# Patient Record
Sex: Female | Born: 1990 | Race: Black or African American | Hispanic: No | Marital: Single | Smoking: Former smoker
Health system: Southern US, Community
[De-identification: ages and names within clinical notes are randomized; demographics above are authoritative.]

## PROBLEM LIST (undated history)

## (undated) ENCOUNTER — Inpatient Hospital Stay (HOSPITAL_COMMUNITY): Payer: Self-pay

## (undated) DIAGNOSIS — Z62819 Personal history of unspecified abuse in childhood: Secondary | ICD-10-CM

## (undated) DIAGNOSIS — O24419 Gestational diabetes mellitus in pregnancy, unspecified control: Secondary | ICD-10-CM

## (undated) DIAGNOSIS — F32A Depression, unspecified: Secondary | ICD-10-CM

## (undated) DIAGNOSIS — O139 Gestational [pregnancy-induced] hypertension without significant proteinuria, unspecified trimester: Secondary | ICD-10-CM

## (undated) DIAGNOSIS — J45909 Unspecified asthma, uncomplicated: Secondary | ICD-10-CM

## (undated) DIAGNOSIS — R768 Other specified abnormal immunological findings in serum: Secondary | ICD-10-CM

## (undated) DIAGNOSIS — N12 Tubulo-interstitial nephritis, not specified as acute or chronic: Secondary | ICD-10-CM

## (undated) DIAGNOSIS — F319 Bipolar disorder, unspecified: Secondary | ICD-10-CM

## (undated) DIAGNOSIS — R112 Nausea with vomiting, unspecified: Secondary | ICD-10-CM

## (undated) DIAGNOSIS — F329 Major depressive disorder, single episode, unspecified: Secondary | ICD-10-CM

## (undated) DIAGNOSIS — A749 Chlamydial infection, unspecified: Secondary | ICD-10-CM

## (undated) DIAGNOSIS — F99 Mental disorder, not otherwise specified: Secondary | ICD-10-CM

## (undated) DIAGNOSIS — O009 Unspecified ectopic pregnancy without intrauterine pregnancy: Secondary | ICD-10-CM

## (undated) DIAGNOSIS — R011 Cardiac murmur, unspecified: Secondary | ICD-10-CM

## (undated) DIAGNOSIS — Z9889 Other specified postprocedural states: Secondary | ICD-10-CM

## (undated) DIAGNOSIS — D649 Anemia, unspecified: Secondary | ICD-10-CM

## (undated) HISTORY — PX: WISDOM TOOTH EXTRACTION: SHX21

## (undated) HISTORY — DX: Gestational diabetes mellitus in pregnancy, unspecified control: O24.419

## (undated) HISTORY — DX: Anemia, unspecified: D64.9

## (undated) HISTORY — DX: Mental disorder, not otherwise specified: F99

## (undated) HISTORY — DX: Tubulo-interstitial nephritis, not specified as acute or chronic: N12

## (undated) HISTORY — PX: SKIN GRAFT: SHX250

## (undated) HISTORY — PX: SMALL INTESTINE SURGERY: SHX150

## (undated) HISTORY — PX: LIVER BIOPSY: SHX301

## (undated) HISTORY — DX: Gestational (pregnancy-induced) hypertension without significant proteinuria, unspecified trimester: O13.9

## (undated) HISTORY — PX: DILATION AND CURETTAGE OF UTERUS: SHX78

## (undated) SURGERY — Surgical Case
Anesthesia: *Unknown

---

## 2009-06-22 ENCOUNTER — Ambulatory Visit (HOSPITAL_COMMUNITY): Admission: RE | Admit: 2009-06-22 | Discharge: 2009-06-22 | Payer: Self-pay | Admitting: Obstetrics and Gynecology

## 2009-07-02 ENCOUNTER — Inpatient Hospital Stay (HOSPITAL_COMMUNITY): Admission: AD | Admit: 2009-07-02 | Discharge: 2009-07-02 | Payer: Self-pay | Admitting: Obstetrics and Gynecology

## 2009-07-12 ENCOUNTER — Ambulatory Visit: Payer: Self-pay | Admitting: Gastroenterology

## 2009-07-26 ENCOUNTER — Ambulatory Visit: Payer: Self-pay | Admitting: Gastroenterology

## 2009-08-04 ENCOUNTER — Inpatient Hospital Stay (HOSPITAL_COMMUNITY): Admission: AD | Admit: 2009-08-04 | Discharge: 2009-08-04 | Payer: Self-pay | Admitting: Obstetrics and Gynecology

## 2009-08-06 ENCOUNTER — Inpatient Hospital Stay (HOSPITAL_COMMUNITY): Admission: AD | Admit: 2009-08-06 | Discharge: 2009-08-10 | Payer: Self-pay | Admitting: Obstetrics and Gynecology

## 2009-09-08 ENCOUNTER — Emergency Department (HOSPITAL_COMMUNITY): Admission: EM | Admit: 2009-09-08 | Discharge: 2009-09-08 | Payer: Self-pay | Admitting: Emergency Medicine

## 2009-09-28 ENCOUNTER — Emergency Department (HOSPITAL_COMMUNITY): Admission: EM | Admit: 2009-09-28 | Discharge: 2009-09-28 | Payer: Self-pay | Admitting: Emergency Medicine

## 2010-03-13 ENCOUNTER — Emergency Department (HOSPITAL_COMMUNITY): Admission: EM | Admit: 2010-03-13 | Discharge: 2010-03-13 | Payer: Self-pay | Admitting: Emergency Medicine

## 2010-04-20 ENCOUNTER — Ambulatory Visit: Payer: Self-pay | Admitting: Nurse Practitioner

## 2010-04-20 ENCOUNTER — Inpatient Hospital Stay (HOSPITAL_COMMUNITY): Admission: AD | Admit: 2010-04-20 | Discharge: 2010-04-21 | Payer: Self-pay | Admitting: Obstetrics & Gynecology

## 2010-08-04 NOTE — L&D Delivery Note (Signed)
Delivery Note At 4:25 PM a viable and healthy female was delivered via Vaginal, Spontaneous Delivery (Presentation: Left Occiput Anterior).  APGAR: 8, 9; weight 6 lb 13.7 oz (3110 g).   Placenta status: Intact, Spontaneous.  Cord: 3 vessels with the following complications: None.    Anesthesia: Epidural  Episiotomy: None Lacerations: None Suture Repair: N/A Est. Blood Loss (mL): 250  Mom to postpartum.  Baby to nursery-stable.  Mat Carne 05/20/2011, 5:37 PM

## 2010-10-12 ENCOUNTER — Inpatient Hospital Stay (HOSPITAL_COMMUNITY)
Admission: AD | Admit: 2010-10-12 | Discharge: 2010-10-12 | Disposition: A | Discharge status: Home / Self Care | Payer: Medicaid Other | Source: Ambulatory Visit | Attending: Obstetrics and Gynecology | Admitting: Obstetrics and Gynecology

## 2010-10-12 ENCOUNTER — Inpatient Hospital Stay (HOSPITAL_COMMUNITY): Payer: Medicaid Other

## 2010-10-12 DIAGNOSIS — O210 Mild hyperemesis gravidarum: Secondary | ICD-10-CM | POA: Insufficient documentation

## 2010-10-12 LAB — COMPREHENSIVE METABOLIC PANEL
AST: 19 U/L (ref 0–37)
Albumin: 3.3 g/dL — ABNORMAL LOW (ref 3.5–5.2)
BUN: 3 mg/dL — ABNORMAL LOW (ref 6–23)
Calcium: 8.5 mg/dL (ref 8.4–10.5)
Chloride: 103 mEq/L (ref 96–112)
Creatinine, Ser: 0.63 mg/dL (ref 0.4–1.2)
GFR calc Af Amer: >60 mL/min (ref >60)
GFR calc non Af Amer: >60 mL/min (ref >60)
Potassium: 3.4 mEq/L — ABNORMAL LOW (ref 3.5–5.1)
Total Protein: 6.5 g/dL (ref 6.0–8.3)

## 2010-10-12 LAB — WET PREP, GENITAL
Trich, Wet Prep: NONE SEEN
Yeast Wet Prep HPF POC: NONE SEEN

## 2010-10-12 LAB — URINALYSIS, ROUTINE W REFLEX MICROSCOPIC
Bilirubin Urine: NEGATIVE
Glucose, UA: NEGATIVE mg/dL
Specific Gravity, Urine: 1.01 (ref 1.005–1.030)
pH: 7 (ref 5.0–8.0)

## 2010-10-12 LAB — CBC
MCH: 29.5 pg (ref 26.0–34.0)
MCHC: 33.6 g/dL (ref 30.0–36.0)
MCV: 87.8 fL (ref 78.0–100.0)
RBC: 4.03 MIL/uL (ref 3.87–5.11)

## 2010-10-12 LAB — DIFFERENTIAL
Eosinophils Absolute: 0 10*3/uL (ref 0.0–0.7)
Eosinophils Relative: 0 % (ref 0–5)
Lymphs Abs: 1.1 10*3/uL (ref 0.7–4.0)
Monocytes Absolute: 0.8 10*3/uL (ref 0.1–1.0)
Neutrophils Relative %: 71 % (ref 43–77)

## 2010-10-12 LAB — POCT PREGNANCY, URINE: Preg Test, Ur: POSITIVE

## 2010-10-13 LAB — INFLUENZA PANEL BY PCR (TYPE A & B): Influenza B By PCR: NEGATIVE

## 2010-10-15 LAB — GC/CHLAMYDIA PROBE AMP, GENITAL: Chlamydia, DNA Probe: NEGATIVE

## 2010-10-16 ENCOUNTER — Telehealth (INDEPENDENT_AMBULATORY_CARE_PROVIDER_SITE_OTHER): Payer: Self-pay | Admitting: *Deleted

## 2010-10-17 LAB — URINALYSIS, ROUTINE W REFLEX MICROSCOPIC
Ketones, ur: NEGATIVE mg/dL
Nitrite: NEGATIVE
Protein, ur: NEGATIVE mg/dL
Urobilinogen, UA: 1 mg/dL (ref 0.0–1.0)

## 2010-10-17 LAB — URINE MICROSCOPIC-ADD ON

## 2010-10-17 LAB — URINE CULTURE
Colony Count: 4000
Culture  Setup Time: 201109181205

## 2010-10-17 LAB — WET PREP, GENITAL: Trich, Wet Prep: NONE SEEN

## 2010-10-17 LAB — POCT PREGNANCY, URINE: Preg Test, Ur: NEGATIVE

## 2010-10-18 LAB — URINALYSIS, ROUTINE W REFLEX MICROSCOPIC
Glucose, UA: NEGATIVE mg/dL
Ketones, ur: >80 mg/dL — AB
Protein, ur: 100 mg/dL — AB
Specific Gravity, Urine: 1.039 — ABNORMAL HIGH (ref 1.005–1.030)
Urobilinogen, UA: 1 mg/dL (ref 0.0–1.0)

## 2010-10-18 LAB — URINE MICROSCOPIC-ADD ON

## 2010-10-18 LAB — DIFFERENTIAL
Basophils Relative: 0 % (ref 0–1)
Monocytes Absolute: 1.3 10*3/uL — ABNORMAL HIGH (ref 0.1–1.0)
Neutrophils Relative %: 68 % (ref 43–77)

## 2010-10-18 LAB — CBC
Hemoglobin: 13.1 g/dL (ref 12.0–15.0)
MCH: 30.1 pg (ref 26.0–34.0)
MCV: 86.7 fL (ref 78.0–100.0)
Platelets: 202 10*3/uL (ref 150–400)

## 2010-10-18 LAB — BASIC METABOLIC PANEL
CO2: 21 mEq/L (ref 19–32)
Chloride: 100 mEq/L (ref 96–112)
Creatinine, Ser: 0.94 mg/dL (ref 0.4–1.2)
GFR calc Af Amer: >60 mL/min (ref >60)
GFR calc non Af Amer: >60 mL/min (ref >60)
Glucose, Bld: 92 mg/dL (ref 70–99)
Sodium: 131 mEq/L — ABNORMAL LOW (ref 135–145)

## 2010-10-20 LAB — CBC
HCT: 17.9 % — ABNORMAL LOW (ref 36.0–46.0)
HCT: 31.5 % — ABNORMAL LOW (ref 36.0–46.0)
MCV: 88.5 fL (ref 78.0–100.0)
MCV: 88.9 fL (ref 78.0–100.0)
Platelets: 135 10*3/uL — ABNORMAL LOW (ref 150–400)
Platelets: 171 10*3/uL (ref 150–400)
RBC: 3.56 MIL/uL — ABNORMAL LOW (ref 3.87–5.11)
RDW: 14.6 % (ref 11.5–15.5)
WBC: 6.1 10*3/uL (ref 4.0–10.5)
WBC: 8.3 10*3/uL (ref 4.0–10.5)

## 2010-10-20 LAB — COMPREHENSIVE METABOLIC PANEL
Alkaline Phosphatase: 189 U/L — ABNORMAL HIGH (ref 39–117)
BUN: 2 mg/dL — ABNORMAL LOW (ref 6–23)
CO2: 20 mEq/L (ref 19–32)
Chloride: 109 mEq/L (ref 96–112)
GFR calc non Af Amer: >60 mL/min (ref >60)
Glucose, Bld: 79 mg/dL (ref 70–99)
Potassium: 4.4 mEq/L (ref 3.5–5.1)
Total Bilirubin: 0.2 mg/dL — ABNORMAL LOW (ref 0.3–1.2)

## 2010-10-20 LAB — CROSSMATCH

## 2010-10-20 LAB — URINALYSIS, ROUTINE W REFLEX MICROSCOPIC
Leukocytes, UA: NEGATIVE
Nitrite: NEGATIVE
Protein, ur: NEGATIVE mg/dL
Urobilinogen, UA: 0.2 mg/dL (ref 0.0–1.0)

## 2010-10-20 LAB — URINE MICROSCOPIC-ADD ON

## 2010-10-22 NOTE — Progress Notes (Signed)
Summary: ONE TIME NPI  Phone Note From Other Clinic   Summary of Call: Pt's medicaid card has our name on it but  is not our Pt.Pt is expecting and was @ [facility] in their emergency dept. A ONE time npi was given. [facility] will inform Pt to get card changed.  Initial call taken by: Candi Leash,  October 16, 2010 4:44 PM

## 2010-10-24 LAB — COMPREHENSIVE METABOLIC PANEL
ALT: 14 U/L (ref 0–35)
Alkaline Phosphatase: 73 U/L (ref 39–117)
CO2: 25 mEq/L (ref 19–32)
GFR calc non Af Amer: >60 mL/min (ref >60)
Glucose, Bld: 78 mg/dL (ref 70–99)
Potassium: 3.9 mEq/L (ref 3.5–5.1)
Sodium: 139 mEq/L (ref 135–145)

## 2010-10-24 LAB — URINE MICROSCOPIC-ADD ON

## 2010-10-24 LAB — URINALYSIS, ROUTINE W REFLEX MICROSCOPIC
Ketones, ur: 40 mg/dL — AB
Protein, ur: 100 mg/dL — AB
Urobilinogen, UA: 1 mg/dL (ref 0.0–1.0)

## 2010-10-24 LAB — POCT PREGNANCY, URINE: Preg Test, Ur: NEGATIVE

## 2010-10-24 LAB — DIFFERENTIAL
Basophils Relative: 0 % (ref 0–1)
Eosinophils Absolute: 0.1 10*3/uL (ref 0.0–0.7)
Eosinophils Relative: 1 % (ref 0–5)
Monocytes Relative: 9 % (ref 3–12)
Neutrophils Relative %: 40 % — ABNORMAL LOW (ref 43–77)

## 2010-10-24 LAB — CBC
Hemoglobin: 13 g/dL (ref 12.0–15.0)
RBC: 4.28 MIL/uL (ref 3.87–5.11)
WBC: 4.1 10*3/uL (ref 4.0–10.5)

## 2010-11-06 LAB — URINALYSIS, ROUTINE W REFLEX MICROSCOPIC
Bilirubin Urine: NEGATIVE
Glucose, UA: NEGATIVE mg/dL
Ketones, ur: NEGATIVE mg/dL
Leukocytes, UA: NEGATIVE
Nitrite: NEGATIVE
Protein, ur: NEGATIVE mg/dL
Specific Gravity, Urine: <1.005 — ABNORMAL LOW (ref 1.005–1.030)
Urobilinogen, UA: 0.2 mg/dL (ref 0.0–1.0)

## 2010-11-06 LAB — URINE MICROSCOPIC-ADD ON

## 2010-11-14 ENCOUNTER — Inpatient Hospital Stay (HOSPITAL_COMMUNITY)
Admission: AD | Admit: 2010-11-14 | Discharge: 2010-11-14 | Disposition: A | Discharge status: Home / Self Care | Payer: Medicaid Other | Source: Ambulatory Visit | Attending: Obstetrics and Gynecology | Admitting: Obstetrics and Gynecology

## 2010-11-14 DIAGNOSIS — N760 Acute vaginitis: Secondary | ICD-10-CM | POA: Insufficient documentation

## 2010-11-14 DIAGNOSIS — B9689 Other specified bacterial agents as the cause of diseases classified elsewhere: Secondary | ICD-10-CM | POA: Insufficient documentation

## 2010-11-14 DIAGNOSIS — R109 Unspecified abdominal pain: Secondary | ICD-10-CM | POA: Insufficient documentation

## 2010-11-14 DIAGNOSIS — A499 Bacterial infection, unspecified: Secondary | ICD-10-CM | POA: Insufficient documentation

## 2010-11-14 DIAGNOSIS — O2390 Unspecified genitourinary tract infection in pregnancy, unspecified trimester: Secondary | ICD-10-CM | POA: Insufficient documentation

## 2010-11-14 LAB — WET PREP, GENITAL

## 2010-11-14 LAB — URINALYSIS, ROUTINE W REFLEX MICROSCOPIC
Bilirubin Urine: NEGATIVE
Glucose, UA: NEGATIVE mg/dL
Leukocytes, UA: NEGATIVE
Nitrite: NEGATIVE

## 2010-11-14 LAB — URINE MICROSCOPIC-ADD ON

## 2010-12-03 LAB — ABO/RH: "RH Type ": POSITIVE

## 2010-12-03 LAB — RUBELLA ANTIBODY, IGM: Rubella: IMMUNE

## 2010-12-03 LAB — SYPHILIS: RPR W/REFLEX TO RPR TITER AND TREPONEMAL ANTIBODIES, TRADITIONAL SCREENING AND DIAGNOSIS ALGORITHM: RPR: NONREACTIVE

## 2010-12-03 LAB — HEPATITIS B SURFACE ANTIGEN: Hepatitis B Surface Ag: POSITIVE

## 2010-12-20 ENCOUNTER — Ambulatory Visit (HOSPITAL_COMMUNITY): Payer: Self-pay

## 2010-12-25 ENCOUNTER — Emergency Department (HOSPITAL_COMMUNITY): Payer: Medicaid Other

## 2010-12-25 ENCOUNTER — Emergency Department (HOSPITAL_COMMUNITY)
Admission: EM | Admit: 2010-12-25 | Discharge: 2010-12-25 | Disposition: A | Discharge status: Home / Self Care | Payer: Medicaid Other | Attending: Emergency Medicine | Admitting: Emergency Medicine

## 2010-12-25 DIAGNOSIS — O9989 Other specified diseases and conditions complicating pregnancy, childbirth and the puerperium: Secondary | ICD-10-CM | POA: Insufficient documentation

## 2010-12-25 DIAGNOSIS — M542 Cervicalgia: Secondary | ICD-10-CM | POA: Insufficient documentation

## 2010-12-25 DIAGNOSIS — F3130 Bipolar disorder, current episode depressed, mild or moderate severity, unspecified: Secondary | ICD-10-CM | POA: Insufficient documentation

## 2010-12-25 DIAGNOSIS — IMO0002 Reserved for concepts with insufficient information to code with codable children: Secondary | ICD-10-CM | POA: Insufficient documentation

## 2010-12-25 DIAGNOSIS — M25529 Pain in unspecified elbow: Secondary | ICD-10-CM | POA: Insufficient documentation

## 2010-12-25 DIAGNOSIS — J45909 Unspecified asthma, uncomplicated: Secondary | ICD-10-CM | POA: Insufficient documentation

## 2010-12-25 DIAGNOSIS — IMO0001 Reserved for inherently not codable concepts without codable children: Secondary | ICD-10-CM | POA: Insufficient documentation

## 2010-12-25 DIAGNOSIS — Z8619 Personal history of other infectious and parasitic diseases: Secondary | ICD-10-CM | POA: Insufficient documentation

## 2010-12-25 LAB — POCT I-STAT, CHEM 8
Glucose, Bld: 84 mg/dL (ref 70–99)
HCT: 32 % — ABNORMAL LOW (ref 36.0–46.0)
Hemoglobin: 10.9 g/dL — ABNORMAL LOW (ref 12.0–15.0)
Potassium: 3.4 mEq/L — ABNORMAL LOW (ref 3.5–5.1)

## 2011-01-02 ENCOUNTER — Ambulatory Visit: Payer: Self-pay | Admitting: Gastroenterology

## 2011-01-03 ENCOUNTER — Inpatient Hospital Stay (HOSPITAL_COMMUNITY)
Admission: AD | Admit: 2011-01-03 | Discharge: 2011-01-04 | Disposition: A | Discharge status: Home / Self Care | Payer: Medicaid Other | Source: Ambulatory Visit | Attending: Obstetrics and Gynecology | Admitting: Obstetrics and Gynecology

## 2011-01-03 DIAGNOSIS — A499 Bacterial infection, unspecified: Secondary | ICD-10-CM | POA: Insufficient documentation

## 2011-01-03 DIAGNOSIS — B9689 Other specified bacterial agents as the cause of diseases classified elsewhere: Secondary | ICD-10-CM | POA: Insufficient documentation

## 2011-01-03 DIAGNOSIS — N760 Acute vaginitis: Secondary | ICD-10-CM | POA: Insufficient documentation

## 2011-01-03 DIAGNOSIS — R109 Unspecified abdominal pain: Secondary | ICD-10-CM | POA: Insufficient documentation

## 2011-01-03 DIAGNOSIS — O2390 Unspecified genitourinary tract infection in pregnancy, unspecified trimester: Secondary | ICD-10-CM | POA: Insufficient documentation

## 2011-01-04 LAB — WET PREP, GENITAL: Trich, Wet Prep: NONE SEEN

## 2011-01-04 LAB — URINALYSIS, ROUTINE W REFLEX MICROSCOPIC
Glucose, UA: NEGATIVE mg/dL
Hgb urine dipstick: NEGATIVE
Protein, ur: NEGATIVE mg/dL
Specific Gravity, Urine: 1.02 (ref 1.005–1.030)
pH: 8 (ref 5.0–8.0)

## 2011-01-04 LAB — AMNISURE RUPTURE OF MEMBRANE (ROM) NOT AT ARMC: Amnisure ROM: NEGATIVE

## 2011-01-06 LAB — GC/CHLAMYDIA PROBE AMP, GENITAL
Chlamydia, DNA Probe: POSITIVE — AB
GC Probe Amp, Genital: NEGATIVE

## 2011-01-07 ENCOUNTER — Telehealth: Payer: Self-pay | Admitting: Internal Medicine

## 2011-01-07 NOTE — Telephone Encounter (Signed)
Why did I get this report? Do I need to address it? Thx

## 2011-01-07 NOTE — Telephone Encounter (Signed)
Patient is not Dr. Loren Racer patient. No further action is required by him. Closing phone note.

## 2011-01-10 ENCOUNTER — Encounter (HOSPITAL_COMMUNITY): Payer: Medicaid Other

## 2011-01-24 ENCOUNTER — Inpatient Hospital Stay (HOSPITAL_COMMUNITY)
Admission: AD | Admit: 2011-01-24 | Discharge: 2011-01-24 | Disposition: A | Discharge status: Home / Self Care | Payer: Medicaid Other | Source: Ambulatory Visit | Attending: Obstetrics and Gynecology | Admitting: Obstetrics and Gynecology

## 2011-01-24 DIAGNOSIS — A499 Bacterial infection, unspecified: Secondary | ICD-10-CM | POA: Insufficient documentation

## 2011-01-24 DIAGNOSIS — O4700 False labor before 37 completed weeks of gestation, unspecified trimester: Secondary | ICD-10-CM | POA: Insufficient documentation

## 2011-01-24 DIAGNOSIS — N760 Acute vaginitis: Secondary | ICD-10-CM | POA: Insufficient documentation

## 2011-01-24 DIAGNOSIS — B9689 Other specified bacterial agents as the cause of diseases classified elsewhere: Secondary | ICD-10-CM | POA: Insufficient documentation

## 2011-01-24 DIAGNOSIS — O2390 Unspecified genitourinary tract infection in pregnancy, unspecified trimester: Secondary | ICD-10-CM | POA: Insufficient documentation

## 2011-01-24 DIAGNOSIS — B181 Chronic viral hepatitis B without delta-agent: Secondary | ICD-10-CM | POA: Insufficient documentation

## 2011-01-24 DIAGNOSIS — O98519 Other viral diseases complicating pregnancy, unspecified trimester: Secondary | ICD-10-CM | POA: Insufficient documentation

## 2011-01-24 DIAGNOSIS — F319 Bipolar disorder, unspecified: Secondary | ICD-10-CM | POA: Insufficient documentation

## 2011-01-24 DIAGNOSIS — O99340 Other mental disorders complicating pregnancy, unspecified trimester: Secondary | ICD-10-CM | POA: Insufficient documentation

## 2011-01-24 LAB — URINALYSIS, ROUTINE W REFLEX MICROSCOPIC
Bilirubin Urine: NEGATIVE
Glucose, UA: NEGATIVE mg/dL
Hgb urine dipstick: NEGATIVE
Specific Gravity, Urine: 1.015 (ref 1.005–1.030)
pH: 8 (ref 5.0–8.0)

## 2011-01-24 LAB — RAPID URINE DRUG SCREEN, HOSP PERFORMED
Benzodiazepines: NOT DETECTED
Cocaine: NOT DETECTED

## 2011-01-24 LAB — WET PREP, GENITAL
Trich, Wet Prep: NONE SEEN
Yeast Wet Prep HPF POC: NONE SEEN

## 2011-01-24 LAB — URINE MICROSCOPIC-ADD ON

## 2011-01-26 LAB — STREP B DNA PROBE: Strep Group B Ag: NEGATIVE

## 2011-01-28 LAB — GC/CHLAMYDIA PROBE AMP, GENITAL: GC Probe Amp, Genital: NEGATIVE

## 2011-02-13 ENCOUNTER — Ambulatory Visit: Payer: Medicaid Other | Admitting: Gastroenterology

## 2011-03-08 ENCOUNTER — Inpatient Hospital Stay (HOSPITAL_COMMUNITY)
Admission: AD | Admit: 2011-03-08 | Discharge: 2011-03-08 | Disposition: A | Discharge status: Home / Self Care | Payer: Medicaid Other | Source: Ambulatory Visit | Attending: Obstetrics and Gynecology | Admitting: Obstetrics and Gynecology

## 2011-03-08 ENCOUNTER — Encounter (HOSPITAL_COMMUNITY): Payer: Self-pay

## 2011-03-08 DIAGNOSIS — R109 Unspecified abdominal pain: Secondary | ICD-10-CM | POA: Insufficient documentation

## 2011-03-08 DIAGNOSIS — O99891 Other specified diseases and conditions complicating pregnancy: Secondary | ICD-10-CM | POA: Insufficient documentation

## 2011-03-08 HISTORY — DX: Other specified abnormal immunological findings in serum: R76.8

## 2011-03-08 LAB — URINALYSIS, ROUTINE W REFLEX MICROSCOPIC
Bilirubin Urine: NEGATIVE
Glucose, UA: NEGATIVE mg/dL
Nitrite: NEGATIVE

## 2011-03-08 LAB — WET PREP, GENITAL: Trich, Wet Prep: NONE SEEN

## 2011-03-08 LAB — FETAL FIBRONECTIN: Fetal Fibronectin: NEGATIVE

## 2011-03-08 MED ORDER — METRONIDAZOLE 500 MG PO TABS: 500.0000 mg | ORAL_TABLET | Freq: Two times a day (BID) | ORAL | Status: AC

## 2011-03-08 NOTE — Progress Notes (Signed)
Left message
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
## 2011-03-08 NOTE — Progress Notes (Signed)
Onset of abdominal cramping and pressure since this morning progressively worse, no vaginal bleeding, clear discharge

## 2011-03-08 NOTE — Progress Notes (Signed)
Notified of pt having pain with baby movment and no ctx detected on monitor. Will come to MAU to assess pt

## 2011-03-08 NOTE — ED Notes (Signed)
No UCs, FHR reassuring. Wet prep shows BV.  FFN negative.  Will discharge home with Rx Flagyl for BV. Strongly encouraged patient to make appointment this week with [facility], if we are to remain her providers of record.  Patient seems to understand, and advises she will do so.  Nigel Bridgeman, CNM, [place]

## 2011-03-11 LAB — CULTURE, BETA STREP (GROUP B ONLY)

## 2011-04-01 DIAGNOSIS — O0930 Supervision of pregnancy with insufficient antenatal care, unspecified trimester: Secondary | ICD-10-CM

## 2011-04-01 DIAGNOSIS — O98419 Viral hepatitis complicating pregnancy, unspecified trimester: Secondary | ICD-10-CM | POA: Insufficient documentation

## 2011-04-01 DIAGNOSIS — B1910 Unspecified viral hepatitis B without hepatic coma: Secondary | ICD-10-CM | POA: Insufficient documentation

## 2011-04-01 DIAGNOSIS — F319 Bipolar disorder, unspecified: Secondary | ICD-10-CM | POA: Insufficient documentation

## 2011-04-01 DIAGNOSIS — F329 Major depressive disorder, single episode, unspecified: Secondary | ICD-10-CM | POA: Insufficient documentation

## 2011-04-01 DIAGNOSIS — F32A Depression, unspecified: Secondary | ICD-10-CM | POA: Insufficient documentation

## 2011-04-04 ENCOUNTER — Inpatient Hospital Stay (HOSPITAL_COMMUNITY)
Admission: AD | Admit: 2011-04-04 | Discharge: 2011-04-04 | Disposition: A | Discharge status: Home / Self Care | Payer: Medicaid Other | Source: Ambulatory Visit | Attending: Obstetrics and Gynecology | Admitting: Obstetrics and Gynecology

## 2011-04-04 ENCOUNTER — Encounter (HOSPITAL_COMMUNITY): Payer: Self-pay | Admitting: *Deleted

## 2011-04-04 DIAGNOSIS — A564 Chlamydial infection of pharynx: Secondary | ICD-10-CM | POA: Insufficient documentation

## 2011-04-04 DIAGNOSIS — IMO0002 Reserved for concepts with insufficient information to code with codable children: Secondary | ICD-10-CM | POA: Insufficient documentation

## 2011-04-04 DIAGNOSIS — O98319 Other infections with a predominantly sexual mode of transmission complicating pregnancy, unspecified trimester: Secondary | ICD-10-CM | POA: Insufficient documentation

## 2011-04-04 DIAGNOSIS — A568 Sexually transmitted chlamydial infection of other sites: Secondary | ICD-10-CM

## 2011-04-04 DIAGNOSIS — Z3480 Encounter for supervision of other normal pregnancy, unspecified trimester: Secondary | ICD-10-CM

## 2011-04-04 DIAGNOSIS — F319 Bipolar disorder, unspecified: Secondary | ICD-10-CM | POA: Insufficient documentation

## 2011-04-04 HISTORY — DX: Chlamydial infection, unspecified: A74.9

## 2011-04-04 HISTORY — DX: Bipolar disorder, unspecified: F31.9

## 2011-04-04 LAB — AMNISURE RUPTURE OF MEMBRANE (ROM) NOT AT ARMC: Amnisure ROM: NEGATIVE

## 2011-04-04 LAB — WET PREP, GENITAL
Trich, Wet Prep: NONE SEEN
Yeast Wet Prep HPF POC: NONE SEEN

## 2011-04-04 MED ORDER — AZITHROMYCIN 1 G PO PACK
1.0000 g | PACK | Freq: Once | ORAL | Status: AC
  Administered 2011-04-04: 1 g via ORAL
  Filled 2011-04-04: qty 1
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
## 2011-04-04 NOTE — Progress Notes (Signed)
?   Clear fluid after urinating noted at noon.  No bleeding.  Small amts since, ctx's started shortly after that.   Was going to [facility], has appt at [facility] 09/05,

## 2011-04-07 LAB — CULTURE, BETA STREP (GROUP B ONLY)

## 2011-04-08 LAB — GC/CHLAMYDIA PROBE AMP, GENITAL: GC Probe Amp, Genital: NEGATIVE

## 2011-04-09 ENCOUNTER — Ambulatory Visit (INDEPENDENT_AMBULATORY_CARE_PROVIDER_SITE_OTHER): Payer: Medicaid Other | Admitting: Family Medicine

## 2011-04-09 ENCOUNTER — Encounter: Payer: Self-pay | Admitting: Family Medicine

## 2011-04-09 DIAGNOSIS — B1910 Unspecified viral hepatitis B without hepatic coma: Secondary | ICD-10-CM

## 2011-04-09 DIAGNOSIS — O09292 Supervision of pregnancy with other poor reproductive or obstetric history, second trimester: Secondary | ICD-10-CM | POA: Insufficient documentation

## 2011-04-09 DIAGNOSIS — O0990 Supervision of high risk pregnancy, unspecified, unspecified trimester: Secondary | ICD-10-CM

## 2011-04-09 DIAGNOSIS — Z8751 Personal history of pre-term labor: Secondary | ICD-10-CM

## 2011-04-09 DIAGNOSIS — Z3480 Encounter for supervision of other normal pregnancy, unspecified trimester: Secondary | ICD-10-CM

## 2011-04-09 DIAGNOSIS — O98519 Other viral diseases complicating pregnancy, unspecified trimester: Secondary | ICD-10-CM

## 2011-04-09 DIAGNOSIS — O99340 Other mental disorders complicating pregnancy, unspecified trimester: Secondary | ICD-10-CM

## 2011-04-09 DIAGNOSIS — F329 Major depressive disorder, single episode, unspecified: Secondary | ICD-10-CM

## 2011-04-09 DIAGNOSIS — O26619 Liver and biliary tract disorders in pregnancy, unspecified trimester: Secondary | ICD-10-CM

## 2011-04-09 LAB — POCT URINALYSIS DIP (DEVICE)
Bilirubin Urine: NEGATIVE
Glucose, UA: NEGATIVE mg/dL
Hgb urine dipstick: NEGATIVE
Leukocytes, UA: NEGATIVE
Nitrite: NEGATIVE

## 2011-04-09 NOTE — Progress Notes (Signed)
Patient seen.  Transferred to our practice from [facility] [place] [facility] due to patient preference.  Has history of preterm labor with previous pregnancy at 35 weeks with SROM.   Having mild intermittent contractions with back pain. No vaginal d/c.  Was treated for Chlamydia in [facility]U.  Not sure if partner treated.  Counseled not to have intercourse until sure that partner was treated.  F/u in [facility] in 2 weeks.  1hr GTC done today.

## 2011-04-09 NOTE — Progress Notes (Signed)
Having pelvic pain and pressure. Did 1 hr gtt today lab draw due at 1050

## 2011-04-10 LAB — GLUCOSE TOLERANCE, 1 HOUR: Glucose, 1 Hour GTT: 96 mg/dL (ref 70–140)

## 2011-04-23 ENCOUNTER — Other Ambulatory Visit: Payer: Self-pay | Admitting: Obstetrics and Gynecology

## 2011-04-23 ENCOUNTER — Ambulatory Visit (INDEPENDENT_AMBULATORY_CARE_PROVIDER_SITE_OTHER): Payer: Medicaid Other | Admitting: Physician Assistant

## 2011-04-23 DIAGNOSIS — F329 Major depressive disorder, single episode, unspecified: Secondary | ICD-10-CM

## 2011-04-23 DIAGNOSIS — F319 Bipolar disorder, unspecified: Secondary | ICD-10-CM

## 2011-04-23 DIAGNOSIS — O09219 Supervision of pregnancy with history of pre-term labor, unspecified trimester: Secondary | ICD-10-CM

## 2011-04-23 DIAGNOSIS — B1910 Unspecified viral hepatitis B without hepatic coma: Secondary | ICD-10-CM

## 2011-04-23 DIAGNOSIS — O99340 Other mental disorders complicating pregnancy, unspecified trimester: Secondary | ICD-10-CM

## 2011-04-23 DIAGNOSIS — O98419 Viral hepatitis complicating pregnancy, unspecified trimester: Secondary | ICD-10-CM

## 2011-04-23 DIAGNOSIS — Z8751 Personal history of pre-term labor: Secondary | ICD-10-CM

## 2011-04-23 DIAGNOSIS — O98519 Other viral diseases complicating pregnancy, unspecified trimester: Secondary | ICD-10-CM

## 2011-04-23 LAB — POCT URINALYSIS DIP (DEVICE)
Glucose, UA: NEGATIVE mg/dL
Nitrite: NEGATIVE
Urobilinogen, UA: 0.2 mg/dL (ref 0.0–1.0)

## 2011-04-23 NOTE — Patient Instructions (Signed)
False Labor (Braxton Hicks Contractions) Pregnancy is commonly associated with contractions of the uterus throughout the pregnancy. Towards the end of pregnancy (32-34 weeks), these contractions [facility] Willa Rough) can develop more often and may become more forceful. This is not true labor because these contractions do not result in opening (dilatation) and thinning of the cervix. They are sometimes difficult to tell apart from true labor because these contractions can be forceful and people have different pain tolerances. You should not feel embarrassed if you go to the hospital with false labor. Sometimes, the only way to tell if you are in true labor is for your caregiver to follow the changes in the cervix. HOW TO TELL THE DIFFERENCE BETWEEN TRUE AND FALSE LABOR  False labor.   The contractions of false labor are usually shorter, irregular and not as hard as those of true labor.   They are often felt in the front of the lower abdomen and in the groin.   They may leave with walking around or changing positions while lying down.   They get weaker and are shorter lasting as time goes on.   These contractions are usually irregular.   They do not usually become progressively stronger, regular and closer together as with true labor.   True labor.   Contractions in true labor last 30 to 70 seconds, become very regular, usually become more intense, and increase in frequency.   They do not go away with walking.   The discomfort is usually felt in the top of the uterus and spreads to the lower abdomen and low back.   True labor can be determined by your caregiver with an exam. This will show that the cervix is dilating and getting thinner.  If there are no prenatal problems or other health problems associated with the pregnancy, it is completely safe to be sent home with false labor and await the onset of true labor. HOME CARE INSTRUCTIONS  Keep up with your usual exercises and instructions.    Take medications as directed.   Keep your regular prenatal appointment.   Eat and drink lightly if you think you are going into labor.   If BH contractions are making you uncomfortable:   Change your activity position from lying down or resting to walking/walking to resting.   Sit and rest in a tub of warm water.   Drink 2 to 3 glasses of water. Dehydration may cause B-H contractions.   Do slow and deep breathing several times an hour.  SEEK IMMEDIATE MEDICAL CARE IF:  Your contractions continue to become stronger, more regular, and closer together.   You have a gushing, burst or leaking of fluid from the vagina.   An oral temperature above 100.5 develops.   You have passage of blood-tinged mucus.   You develop vaginal bleeding.   You develop continuous belly (abdominal) pain.   You have low back pain that you never had before.   You feel the baby's head pushing down causing pelvic pressure.   The baby is not moving as much as it used to.  Document Released: 07/21/2005 Document Re-Released: 01/08/2010 [facility] Patient Information 2011 [place], [place].

## 2011-04-23 NOTE — Progress Notes (Signed)
No complaints +FM. GBS/GC/Chl at next visit

## 2011-04-28 ENCOUNTER — Ambulatory Visit (INDEPENDENT_AMBULATORY_CARE_PROVIDER_SITE_OTHER): Payer: Medicaid Other | Admitting: Obstetrics & Gynecology

## 2011-04-28 DIAGNOSIS — Z331 Pregnant state, incidental: Secondary | ICD-10-CM

## 2011-04-28 LAB — POCT URINALYSIS DIP (DEVICE)
Glucose, UA: NEGATIVE mg/dL
Ketones, ur: NEGATIVE mg/dL
Nitrite: POSITIVE — AB

## 2011-04-28 NOTE — Progress Notes (Signed)
Pt has leuks, nitrites, and protein in urine.  Will treat empirically for UTI with Macrobid.  U Cx to be sent.  GBS, GC/ Chlam sent.  Pt states she was treated after the + Chlam several weeks ago.

## 2011-04-28 NOTE — Progress Notes (Signed)
Pulse 86. No vaginal discharge.

## 2011-04-29 LAB — GC/CHLAMYDIA PROBE AMP, GENITAL: Chlamydia, DNA Probe: NEGATIVE

## 2011-04-30 LAB — CULTURE, OB URINE
Colony Count: NO GROWTH
Organism ID, Bacteria: NO GROWTH

## 2011-05-02 ENCOUNTER — Inpatient Hospital Stay (HOSPITAL_COMMUNITY)
Admission: AD | Admit: 2011-05-02 | Discharge: 2011-05-02 | Disposition: A | Discharge status: Home / Self Care | Payer: Medicaid Other | Source: Ambulatory Visit | Attending: Obstetrics & Gynecology | Admitting: Obstetrics & Gynecology

## 2011-05-02 ENCOUNTER — Encounter (HOSPITAL_COMMUNITY): Payer: Self-pay

## 2011-05-02 DIAGNOSIS — R109 Unspecified abdominal pain: Secondary | ICD-10-CM | POA: Insufficient documentation

## 2011-05-02 DIAGNOSIS — N949 Unspecified condition associated with female genital organs and menstrual cycle: Secondary | ICD-10-CM

## 2011-05-02 DIAGNOSIS — A499 Bacterial infection, unspecified: Secondary | ICD-10-CM | POA: Insufficient documentation

## 2011-05-02 DIAGNOSIS — O368190 Decreased fetal movements, unspecified trimester, not applicable or unspecified: Secondary | ICD-10-CM | POA: Insufficient documentation

## 2011-05-02 DIAGNOSIS — B9689 Other specified bacterial agents as the cause of diseases classified elsewhere: Secondary | ICD-10-CM | POA: Insufficient documentation

## 2011-05-02 DIAGNOSIS — O2390 Unspecified genitourinary tract infection in pregnancy, unspecified trimester: Secondary | ICD-10-CM | POA: Insufficient documentation

## 2011-05-02 DIAGNOSIS — N760 Acute vaginitis: Secondary | ICD-10-CM | POA: Insufficient documentation

## 2011-05-02 LAB — URINALYSIS, ROUTINE W REFLEX MICROSCOPIC
Bilirubin Urine: NEGATIVE
Glucose, UA: NEGATIVE mg/dL
Hgb urine dipstick: NEGATIVE
Protein, ur: NEGATIVE mg/dL
Specific Gravity, Urine: 1.02 (ref 1.005–1.030)
Urobilinogen, UA: 0.2 mg/dL (ref 0.0–1.0)

## 2011-05-02 LAB — WET PREP, GENITAL
Trich, Wet Prep: NONE SEEN
Yeast Wet Prep HPF POC: NONE SEEN

## 2011-05-02 MED ORDER — ACETAMINOPHEN 325 MG PO TABS
650.0000 mg | ORAL_TABLET | Freq: Once | ORAL | Status: AC
  Administered 2011-05-02: 650 mg via ORAL
  Filled 2011-05-02: qty 2

## 2011-05-02 MED ORDER — METRONIDAZOLE 500 MG PO TABS: 500.0000 mg | ORAL_TABLET | Freq: Two times a day (BID) | ORAL | Status: AC

## 2011-05-02 NOTE — Progress Notes (Signed)
Patient states that she has not felt baby move all day. She was dx uti on Wednesday but have not gotten her rx filled. She states that she still have pelvic pressure. She denies any contractions, vaginal bleeding, lof. 1cm dilated.

## 2011-05-02 NOTE — ED Provider Notes (Signed)
Patient reviewed with Dr Elwyn Reach, agree with assessment and plans.

## 2011-05-02 NOTE — ED Provider Notes (Signed)
History     Chief Complaint  Patient presents with  . Decreased Fetal Movement  . Abdominal Pain  . Back Pain   HPI Ms. Rybacki is a [id number] at [redacted]w[redacted]d presenting with decreased fetal movement today.  Patient states last ate at 10am this morning; she loses her appetite during pregnancy.  States drinking lots of water and juice.  Also complaining of intermittent pelvic pressure all day.  Describes some mucous discharge; denies loss of fluid and vaginal bleeding.  Has felt baby move since being in [facility].  Denies contractions.  Followed in [facility] for history of preterm delivery; denies any complications with this pregnancy.  Complains of some dysuria.  However GC/Chlamydia and urine cx from 4 days ago are negative.  Did have episode of sharp, cramp-like pelvic pain when laid back for the speculum exam.  Denies f/c/n/v/d and edema.  OB History    Grav Para Term Preterm Abortions TAB SAB Ect Mult Living   4 1 0 1 2 1 0 1 0 1       Past Medical History  Diagnosis Date  . Hepatitis B antibody positive   . Anemia   . Mental disorder     depression and bipolar  . Bipolar 1 disorder     hx cutting   . Chlamydia   . S/P dilation and curettage     for TAB and ectopic    Past Surgical History  Procedure Date  . Liver biopsy   . No past surgeries     Family History  Problem Relation Age of Onset  . Adopted: Yes    History  Substance Use Topics  . Smoking status: Never Smoker   . Smokeless tobacco: Not on file  . Alcohol Use: No    Allergies:  Allergies  Allergen Reactions  . Vicodin (Hydrocodone-Acetaminophen) Anaphylaxis    Prescriptions prior to admission  Medication Sig Dispense Refill  . albuterol (PROVENTIL HFA;VENTOLIN HFA) 108 (90 BASE) MCG/ACT inhaler Inhale 2 puffs into the lungs as needed. For asthma       . calcium carbonate (TUMS - DOSED IN MG ELEMENTAL CALCIUM) 500 MG chewable tablet Chew 1-2 tablets by mouth as needed. For heart burn      . prenatal vitamin w/FE, FA  (PRENATAL 1 + 1) 27-1 MG TABS Take 1 tablet by mouth daily.          ROS Physical Exam   Blood pressure 129/65, pulse 86, temperature 98.9 F (37.2 C), temperature source Oral, height 5\' 3"  (1.6 m), weight 194 lb 6 oz (88.168 kg), last menstrual period 08/18/2010.  Physical Exam  Constitutional: She is oriented to person, place, and time. She appears well-developed and well-nourished. No distress.  HENT:  Head: Normocephalic and atraumatic.  Mouth/Throat: Oropharynx is clear and moist.       Lips are dry and cracked   Eyes: No scleral icterus.  Neck: Normal range of motion. Neck supple.  Cardiovascular: Normal rate, regular rhythm, normal heart sounds and intact distal pulses.  Exam reveals no gallop.   No murmur heard. Respiratory: Effort normal and breath sounds normal. She has no wheezes. She has no rales.  GI: Soft. Bowel sounds are normal. There is tenderness (mild diffuse tenderness to palpation).       Gravid with size consistent with dates  Genitourinary:       External genitalia normal.  Vagina normal; small amount of white/yellowish discharge present.    Musculoskeletal: She exhibits no edema and no  tenderness.  Neurological: She is alert and oriented to person, place, and time.  Skin: Skin is warm and dry. No rash noted. No erythema.  Psychiatric: She has a normal mood and affect.  Cervix: closed and thick FHT: 125, moderate variability, accels present, decels absent  MAU Course  Procedures Wet Prep: few clue cells; many WBCs  MDM Pt complained of lower abdominal pain consistent with round ligament pain during spec exam; gave tylenol 650mg .  Assessment and Plan  20 year old [phone] at 62.5 presenting for decreased fetal movement, resolved while in [facility]U HepB positive Fetal wellbeing reassuring Round ligament pain - tylenol BV - flagyl 500mg  BID x7 days Discharge  Home with labor precautions  BOOTH, Tally Mattox 05/02/2011, 9:17 PM

## 2011-05-02 NOTE — Progress Notes (Signed)
Pt states, " I haven't felt the baby move all day, and I've had low bad pressure and cramping in my low back for three days, and worse today. I have a clear> orange discharge today."

## 2011-05-07 ENCOUNTER — Ambulatory Visit (INDEPENDENT_AMBULATORY_CARE_PROVIDER_SITE_OTHER): Payer: Medicaid Other | Admitting: Advanced Practice Midwife

## 2011-05-07 DIAGNOSIS — O09219 Supervision of pregnancy with history of pre-term labor, unspecified trimester: Secondary | ICD-10-CM

## 2011-05-07 DIAGNOSIS — O0930 Supervision of pregnancy with insufficient antenatal care, unspecified trimester: Secondary | ICD-10-CM

## 2011-05-07 DIAGNOSIS — Z8751 Personal history of pre-term labor: Secondary | ICD-10-CM

## 2011-05-07 LAB — POCT URINALYSIS DIP (DEVICE)
Glucose, UA: NEGATIVE mg/dL
Hgb urine dipstick: NEGATIVE
Nitrite: NEGATIVE
Urobilinogen, UA: 1 mg/dL (ref 0.0–1.0)

## 2011-05-07 NOTE — Progress Notes (Signed)
  Subjective:    Lauren Moss is a 20 y.o. female being seen today for her obstetrical visit. She is at [redacted]w[redacted]d gestation. Patient reports no bleeding, no contractions and no leaking. Fetal movement: normal.  Menstrual History: OB History    Grav Para Term Preterm Abortions TAB SAB Ect Mult Living   4 1 0 1 2 1 0 1 0 1       Patient's last menstrual period was 08/18/2010.    The following portions of the patient's history were reviewed and updated as appropriate: allergies, current medications, past family history, past medical history, past social history, past surgical history and problem list.  Review of Systems Pertinent items are noted in HPI.   Objective:    BP 109/69  Temp 98.3 F (36.8 C)  Wt 87.181 kg (192 lb 3.2 oz)  LMP 08/18/2010 FHT: 130 BPM  Uterine Size: 37.5 cm  Presentations: cephalic  Pelvic Exam:              Dilation: not assessed       Effacement: Not assessed             Station:  not assessed    Consistency: not assessed            Position: not assessed     Assessment:    Pregnancy 37 and 3/7 weeks   Plan:   Plans for delivery: Vaginal anticipated Beta strep culture: done, results reviewed Counseling: labor precautions discussed Follow up in 1 Week.

## 2011-05-07 NOTE — Progress Notes (Signed)
Addended by: Joseph Berkshire on: 05/07/2011 11:32 AM   Modules accepted: Kipp Brood

## 2011-05-07 NOTE — Progress Notes (Signed)
I supervised patient's care and agree with student's note.

## 2011-05-07 NOTE — Progress Notes (Signed)
No vaginal discharge. Pelvic pressure. Pulse 77. Patient declines flu vaccine.

## 2011-05-14 ENCOUNTER — Inpatient Hospital Stay (HOSPITAL_COMMUNITY)
Admission: AD | Admit: 2011-05-14 | Discharge: 2011-05-14 | Disposition: A | Discharge status: Home / Self Care | Payer: Medicaid Other | Source: Ambulatory Visit | Attending: Family Medicine | Admitting: Family Medicine

## 2011-05-14 ENCOUNTER — Ambulatory Visit (INDEPENDENT_AMBULATORY_CARE_PROVIDER_SITE_OTHER): Payer: Medicaid Other | Admitting: Advanced Practice Midwife

## 2011-05-14 ENCOUNTER — Encounter (HOSPITAL_COMMUNITY): Payer: Self-pay

## 2011-05-14 VITALS — BP 117/73 | HR 85 | Temp 97.4°F | Wt 192.6 lb

## 2011-05-14 DIAGNOSIS — O479 False labor, unspecified: Secondary | ICD-10-CM | POA: Insufficient documentation

## 2011-05-14 DIAGNOSIS — IMO0002 Reserved for concepts with insufficient information to code with codable children: Secondary | ICD-10-CM

## 2011-05-14 DIAGNOSIS — M259 Joint disorder, unspecified: Secondary | ICD-10-CM

## 2011-05-14 DIAGNOSIS — O0930 Supervision of pregnancy with insufficient antenatal care, unspecified trimester: Secondary | ICD-10-CM

## 2011-05-14 NOTE — Progress Notes (Signed)
Report called to Maternity Admissions and pt. Taken to MAU to register.

## 2011-05-14 NOTE — Progress Notes (Signed)
Strong contractions since 1030. ? LOF. Pool, nitrazine and fern neg. Reports decreased FM today. Discussed pt w/ Dr. Adrian Blackwater. Will send to MAU for HST and cervix recheck.  Katrinka Blazing, Ladeja Pelham 05/14/2011 11:02 AM

## 2011-05-14 NOTE — Progress Notes (Signed)
Pain: pelvic, lower back. Reports more pain today than before; 10/10 in pain scale.

## 2011-05-14 NOTE — ED Provider Notes (Signed)
Chart reviewed and agree with management and plan.  

## 2011-05-14 NOTE — ED Provider Notes (Signed)
Diona Fanti [id number] @[redacted]w[redacted]d  Chief Complaint  Patient presents with  . Contractions    SUBJECTIVE  HPI: Seen in clinic today with ?SROM and contraction pain since 1030. PROM ruled out with neg fern and cx noted to be 3/long. Continues to have contraction pain in lower abd and back, now more frequent and intense. No LOF or VB. Good FM. PN course uncomplicated   Past Medical History  Diagnosis Date  . Hepatitis B antibody positive   . Anemia   . Mental disorder     depression and bipolar  . Bipolar 1 disorder     hx cutting   . Chlamydia   . S/P dilation and curettage     for TAB and ectopic   Ob Hx:  35 wk SVD  Past Surgical History  Procedure Date  . Liver biopsy   . No past surgeries    History   Social History  . Marital Status: Single    Spouse Name: N/A    Number of Children: N/A  . Years of Education: N/A   Occupational History  . Not on file.   Social History Main Topics  . Smoking status: Never Smoker   . Smokeless tobacco: Not on file  . Alcohol Use: No  . Drug Use: No  . Sexually Active: Yes   Other Topics Concern  . Not on file   Social History Narrative  . No narrative on file   No current facility-administered medications on file prior to encounter.   Current Outpatient Prescriptions on File Prior to Encounter  Medication Sig Dispense Refill  . calcium carbonate (TUMS - DOSED IN MG ELEMENTAL CALCIUM) 500 MG chewable tablet Chew 1-2 tablets by mouth as needed. For heart burn      . prenatal vitamin w/FE, FA (PRENATAL 1 + 1) 27-1 MG TABS Take 1 tablet by mouth daily.        Marland Kitchen albuterol (PROVENTIL HFA;VENTOLIN HFA) 108 (90 BASE) MCG/ACT inhaler Inhale 2 puffs into the lungs as needed. For asthma        Allergies  Allergen Reactions  . Vicodin (Hydrocodone-Acetaminophen) Anaphylaxis    ROS: Pertinent items in HPI  OBJECTIVE  BP 121/69  Pulse 82  Temp(Src) 98.5 F (36.9 C) (Oral)  Resp 20  SpO2 97%  LMP 08/18/2010     Physical Exam:  General: WN/WD , anxious and somewhat uncomfortable appearing Abd: soft, UCs palpate mild, term size Cx: ext 3-4/ int 1/ 2.5 cm long, -3 vtx Back:neg CVAT  UCs irreg2-8 min FHR: 140, reactive Ext: tr edema, 1+ DTRs   ASSESSMENT  [id number] at 38.3 wks, not in labor Fetus well by [facility]   PLAN Home with labor precautions. F/U [facility] 1 wk

## 2011-05-14 NOTE — Progress Notes (Signed)
Patient is sent from the clinic due to pains with contractions.

## 2011-05-18 ENCOUNTER — Inpatient Hospital Stay (HOSPITAL_COMMUNITY)
Admission: AD | Admit: 2011-05-18 | Discharge: 2011-05-18 | Disposition: A | Discharge status: Home / Self Care | Payer: Medicaid Other | Source: Ambulatory Visit | Attending: Obstetrics and Gynecology | Admitting: Obstetrics and Gynecology

## 2011-05-18 DIAGNOSIS — O479 False labor, unspecified: Secondary | ICD-10-CM

## 2011-05-18 DIAGNOSIS — O0930 Supervision of pregnancy with insufficient antenatal care, unspecified trimester: Secondary | ICD-10-CM

## 2011-05-18 NOTE — Progress Notes (Signed)
G4P1 SABx2 at 39.3wks. Felt pop and gush fld at 0200 and has leaked since then. Ctxs started after leaking started.

## 2011-05-18 NOTE — ED Provider Notes (Signed)
History     Chief Complaint  Patient presents with  . Rupture of Membranes  . Contractions   HPI This is a 20 year old G4 P0 121 at 39 weeks who presents the MAU with concerns of ruptured membranes. She states that this happened deliver before 2:00 a morning. She just use the bathroom and went to the refrigerator and felt some fluid leaking down her leg. She denies regular or frequent contractions, decreased fetal activity, abdominal pain, vaginal discharge, vaginal bleeding. She does have some irregular infrequent contractions.   OB History    Grav Para Term Preterm Abortions TAB SAB Ect Mult Living   4 1 0 1 2 1 0 1 0 1       Past Medical History  Diagnosis Date  . Hepatitis B antibody positive   . Anemia   . Mental disorder     depression and bipolar  . Bipolar 1 disorder     hx cutting   . Chlamydia   . S/P dilation and curettage     for TAB and ectopic    Past Surgical History  Procedure Date  . Liver biopsy   . No past surgeries     Family History  Problem Relation Age of Onset  . Adopted: Yes    History  Substance Use Topics  . Smoking status: Never Smoker   . Smokeless tobacco: Not on file  . Alcohol Use: No    Allergies:  Allergies  Allergen Reactions  . Vicodin (Hydrocodone-Acetaminophen) Anaphylaxis    Prescriptions prior to admission  Medication Sig Dispense Refill  . calcium carbonate (TUMS - DOSED IN MG ELEMENTAL CALCIUM) 500 MG chewable tablet Chew 1-2 tablets by mouth as needed. For heart burn      . prenatal vitamin w/FE, FA (PRENATAL 1 + 1) 27-1 MG TABS Take 1 tablet by mouth daily.          Review of Systems  Constitutional: Negative for fever, chills and weight loss.  Eyes: Negative for blurred vision and double vision.  Gastrointestinal: Negative for heartburn and vomiting.  Genitourinary: Negative for urgency, frequency and hematuria.  Neurological: Negative for headaches.   Physical Exam   Blood pressure 131/72, pulse 82,  temperature 98.3 F (36.8 C), resp. rate 20, height 5\' 3"  (1.6 m), weight 90.447 kg (199 lb 6.4 oz), last menstrual period 08/18/2010, SpO2 97.00%.  Physical Exam  Constitutional: She appears well-developed and well-nourished.  Cardiovascular: Normal rate and regular rhythm.   Respiratory: Effort normal. No respiratory distress. She has no wheezes. She has no rales. She exhibits no tenderness.  GI: Soft. Bowel sounds are normal. She exhibits no distension. There is no tenderness. There is no rebound.       Fundus at term.  Genitourinary:       Speculum exam shows thin whitish fluid. There is no pooling. There is no fluid from the cervical os with Valsalva.   Dilation: 1 Effacement (%): 50 Cervical Position: Middle Station: -3 Presentation: Vertex Exam by:: Dr. Adrian Blackwater  NST shows baseline rate at 120 with moderate variability and accelerations present. There are no decelerations. There is readily contractions noted.  Ferning negative  MAU Course  Procedures  Assessment and Plan  #1 intrauterine pregnancy at 39 weeks #2 false labor.  With ferning negative pooling negative and is unlikely that the patient has spontaneous rupture membranes. Will send the patient home to followup at her next scheduled appointment which is within this coming Wednesday. Patient was counseled return  if she begins to feel regular contractions every 3-5 minutes where she has continued leaking of fluid from the vagina.   STINSON, JACOB JEHIEL 05/18/2011, 3:40 AM

## 2011-05-20 ENCOUNTER — Inpatient Hospital Stay (HOSPITAL_COMMUNITY): Payer: Medicaid Other | Admitting: Anesthesiology

## 2011-05-20 ENCOUNTER — Encounter (HOSPITAL_COMMUNITY): Payer: Self-pay | Admitting: Anesthesiology

## 2011-05-20 ENCOUNTER — Inpatient Hospital Stay (HOSPITAL_COMMUNITY)
Admission: AD | Admit: 2011-05-20 | Discharge: 2011-05-22 | DRG: 775 | Disposition: A | Discharge status: Home / Self Care | Payer: Medicaid Other | Source: Ambulatory Visit | Attending: Obstetrics & Gynecology | Admitting: Obstetrics & Gynecology

## 2011-05-20 ENCOUNTER — Encounter (HOSPITAL_COMMUNITY): Payer: Self-pay | Admitting: *Deleted

## 2011-05-20 DIAGNOSIS — B181 Chronic viral hepatitis B without delta-agent: Secondary | ICD-10-CM | POA: Diagnosis present

## 2011-05-20 DIAGNOSIS — O26619 Liver and biliary tract disorders in pregnancy, unspecified trimester: Principal | ICD-10-CM | POA: Diagnosis present

## 2011-05-20 LAB — CBC
MCH: 27.7 pg (ref 26.0–34.0)
MCV: 86.6 fL (ref 78.0–100.0)
MCV: 86.8 fL (ref 78.0–100.0)
Platelets: 173 10*3/uL (ref 150–400)
Platelets: 166 10*3/uL (ref 150–400)
RBC: 3.72 MIL/uL — ABNORMAL LOW (ref 3.87–5.11)
RDW: 14.5 % (ref 11.5–15.5)
RDW: 14.4 % (ref 11.5–15.5)
WBC: 6.3 10*3/uL (ref 4.0–10.5)

## 2011-05-20 LAB — RAPID URINE DRUG SCREEN, HOSP PERFORMED
Amphetamines: NOT DETECTED
Barbiturates: NOT DETECTED
Benzodiazepines: NOT DETECTED
Tetrahydrocannabinol: NOT DETECTED

## 2011-05-20 LAB — RPR: RPR Ser Ql: NONREACTIVE

## 2011-05-20 MED ORDER — BENZOCAINE-MENTHOL 20-0.5 % EX AERO: 1.0000 "application " | INHALATION_SPRAY | CUTANEOUS | Status: DC | PRN

## 2011-05-20 MED ORDER — LANOLIN HYDROUS EX OINT: TOPICAL_OINTMENT | CUTANEOUS | Status: DC | PRN

## 2011-05-20 MED ORDER — ZOLPIDEM TARTRATE 5 MG PO TABS: 5.0000 mg | ORAL_TABLET | Freq: Every evening | ORAL | Status: DC | PRN

## 2011-05-20 MED ORDER — DIPHENHYDRAMINE HCL 25 MG PO CAPS: 25.0000 mg | ORAL_CAPSULE | Freq: Four times a day (QID) | ORAL | Status: DC | PRN

## 2011-05-20 MED ORDER — MISOPROSTOL 200 MCG PO TABS
ORAL_TABLET | ORAL | Status: AC
  Filled 2011-05-20: qty 5

## 2011-05-20 MED ORDER — PRENATAL PLUS 27-1 MG PO TABS
1.0000 | ORAL_TABLET | Freq: Every day | ORAL | Status: DC
  Filled 2011-05-20 (×3): qty 1

## 2011-05-20 MED ORDER — DIBUCAINE 1 % RE OINT: 1.0000 "application " | TOPICAL_OINTMENT | RECTAL | Status: DC | PRN

## 2011-05-20 MED ORDER — SIMETHICONE 80 MG PO CHEW: 80.0000 mg | CHEWABLE_TABLET | ORAL | Status: DC | PRN

## 2011-05-20 MED ORDER — TETANUS-DIPHTH-ACELL PERTUSSIS 5-2.5-18.5 LF-MCG/0.5 IM SUSP: 0.5000 mL | Freq: Once | INTRAMUSCULAR | Status: DC

## 2011-05-20 MED ORDER — LIDOCAINE HCL (PF) 1 % IJ SOLN
30.0000 mL | INTRAMUSCULAR | Status: DC | PRN
  Administered 2011-05-20: 30 mL via SUBCUTANEOUS
  Filled 2011-05-20 (×2): qty 30

## 2011-05-20 MED ORDER — LACTATED RINGERS IV SOLN
500.0000 mL | Freq: Once | INTRAVENOUS | Status: AC
  Administered 2011-05-20: 1000 mL via INTRAVENOUS

## 2011-05-20 MED ORDER — DIPHENHYDRAMINE HCL 50 MG/ML IJ SOLN: 12.5000 mg | INTRAMUSCULAR | Status: DC | PRN

## 2011-05-20 MED ORDER — ONDANSETRON HCL 4 MG/2ML IJ SOLN: 4.0000 mg | INTRAMUSCULAR | Status: DC | PRN

## 2011-05-20 MED ORDER — OXYTOCIN 20 UNITS IN LACTATED RINGERS INFUSION - SIMPLE
125.0000 mL/h | Freq: Once | INTRAVENOUS | Status: AC
  Administered 2011-05-20: 999 mL/h via INTRAVENOUS
  Filled 2011-05-20: qty 1000

## 2011-05-20 MED ORDER — IBUPROFEN 600 MG PO TABS
600.0000 mg | ORAL_TABLET | Freq: Four times a day (QID) | ORAL | Status: DC
  Administered 2011-05-21 – 2011-05-22 (×5): 600 mg via ORAL
  Filled 2011-05-20 (×7): qty 1

## 2011-05-20 MED ORDER — OXYTOCIN BOLUS FROM INFUSION
500.0000 mL | Freq: Once | INTRAVENOUS | Status: AC
  Administered 2011-05-20: 500 mL via INTRAVENOUS
  Filled 2011-05-20: qty 500
  Filled 2011-05-20: qty 1000

## 2011-05-20 MED ORDER — IBUPROFEN 600 MG PO TABS: 600.0000 mg | ORAL_TABLET | Freq: Four times a day (QID) | ORAL | Status: DC | PRN

## 2011-05-20 MED ORDER — EPHEDRINE 5 MG/ML INJ
10.0000 mg | INTRAVENOUS | Status: DC | PRN
  Filled 2011-05-20: qty 4

## 2011-05-20 MED ORDER — ONDANSETRON HCL 4 MG PO TABS: 4.0000 mg | ORAL_TABLET | ORAL | Status: DC | PRN

## 2011-05-20 MED ORDER — EPHEDRINE 5 MG/ML INJ
10.0000 mg | INTRAVENOUS | Status: DC | PRN
  Filled 2011-05-20 (×2): qty 4

## 2011-05-20 MED ORDER — ONDANSETRON HCL 4 MG/2ML IJ SOLN: 4.0000 mg | Freq: Four times a day (QID) | INTRAMUSCULAR | Status: DC | PRN

## 2011-05-20 MED ORDER — SENNOSIDES-DOCUSATE SODIUM 8.6-50 MG PO TABS
2.0000 | ORAL_TABLET | Freq: Every day | ORAL | Status: DC
  Administered 2011-05-20 – 2011-05-21 (×2): 2 via ORAL

## 2011-05-20 MED ORDER — WITCH HAZEL-GLYCERIN EX PADS: 1.0000 "application " | MEDICATED_PAD | CUTANEOUS | Status: DC | PRN

## 2011-05-20 MED ORDER — LACTATED RINGERS IV SOLN: 500.0000 mL | INTRAVENOUS | Status: DC | PRN

## 2011-05-20 MED ORDER — FENTANYL 2.5 MCG/ML BUPIVACAINE 1/10 % EPIDURAL INFUSION (WH - ANES)
14.0000 mL/h | INTRAMUSCULAR | Status: DC
  Administered 2011-05-20 (×3): 14 mL/h via EPIDURAL
  Filled 2011-05-20 (×3): qty 60

## 2011-05-20 MED ORDER — PHENYLEPHRINE 40 MCG/ML (10ML) SYRINGE FOR IV PUSH (FOR BLOOD PRESSURE SUPPORT)
80.0000 ug | PREFILLED_SYRINGE | INTRAVENOUS | Status: DC | PRN
  Filled 2011-05-20 (×2): qty 5

## 2011-05-20 MED ORDER — LACTATED RINGERS IV SOLN
INTRAVENOUS | Status: DC
  Administered 2011-05-20: 125 mL/h via INTRAVENOUS

## 2011-05-20 MED ORDER — OXYTOCIN 10 UNIT/ML IJ SOLN
INTRAMUSCULAR | Status: AC
  Administered 2011-05-20: 20 [IU] via INTRAVENOUS
  Filled 2011-05-20: qty 2

## 2011-05-20 MED ORDER — FLEET ENEMA 7-19 GM/118ML RE ENEM: 1.0000 | ENEMA | RECTAL | Status: DC | PRN

## 2011-05-20 MED ORDER — PHENYLEPHRINE 40 MCG/ML (10ML) SYRINGE FOR IV PUSH (FOR BLOOD PRESSURE SUPPORT)
80.0000 ug | PREFILLED_SYRINGE | INTRAVENOUS | Status: DC | PRN
  Filled 2011-05-20: qty 5

## 2011-05-20 MED ORDER — CITRIC ACID-SODIUM CITRATE 334-500 MG/5ML PO SOLN: 30.0000 mL | ORAL | Status: DC | PRN

## 2011-05-20 MED ORDER — LIDOCAINE HCL 1.5 % IJ SOLN
INTRAMUSCULAR | Status: DC | PRN
  Administered 2011-05-20 (×2): 5 mL via INTRADERMAL

## 2011-05-20 NOTE — Progress Notes (Signed)
Lauren Moss is a 20 y.o. [phone] at [redacted]w[redacted]d  Subjective: Becoming more comfortable with epid, but still with some abd pain with ctx  Objective: BP 109/59  Pulse 80  Resp 18  Ht 5\' 3"  (1.6 m)  Wt 89.359 kg (197 lb)  BMI 34.90 kg/m2  SpO2 97%  LMP 08/18/2010      FHT:  FHR: 130 bpm, variability: moderate,  accelerations:  Present,  decelerations:  Absent UC:   regular, every 3-5 minutes SVE:   Dilation: 4.5 Effacement (%): 80 Station: -2 Exam by:: Valentina Lucks, RN  Labs: Lab Results  Component Value Date   WBC 5.7 05/20/2011   HGB 10.3* 05/20/2011   HCT 32.2* 05/20/2011   MCV 86.6 05/20/2011   PLT 173 05/20/2011    Assessment / Plan: Spontaneous labor, progressing normally  Will reeval cx at approx noon or sooner prn Marthena Whitmyer 05/20/2011, 10:31 AM

## 2011-05-20 NOTE — Progress Notes (Addendum)
Lauren Moss is a 20 y.o. [phone] with Hep B, a history of postpartum hemorrhage previous preganancy at [redacted]w[redacted]d by admitted for active labor  Subjective: Patient fatigued, minimal pain with contractions s/p epidural, no ROM, bloody show present   Objective: BP 117/54  Pulse 59  Temp(Src) 98.4 F (36.9 C) (Oral)  Resp 18  Ht 5\' 3"  (1.6 m)  Wt 197 lb (89.359 kg)  BMI 34.90 kg/m2  SpO2 97%  LMP 08/18/2010      FHT:  FHR: 130 bpm, variability: moderate,  accelerations:  Present,  decelerations:  Absent UC:   regular, every 5 minutes SVE:   Dilation: 7 Effacement (%): 90 Station: 0 Exam by:: Roslynn Amble MD  Labs: Lab Results  Component Value Date   WBC 5.7 05/20/2011   HGB 10.3* 05/20/2011   HCT 32.2* 05/20/2011   MCV 86.6 05/20/2011   PLT 173 05/20/2011    Assessment / Plan: Spontaneous labor, progressing normally, membranes still intact   Labor: Progressing normally Fetal Wellbeing:  Category I Pain Control:  Epidural I/D:  Patient with Hep B, so proper contact precautions Anticipated MOD:  NSVD  Lauren Moss 05/20/2011, 2:02 PM

## 2011-05-20 NOTE — H&P (Addendum)
Lauren Moss is a 20 y.o. female presenting for contractions. Maternal Medical History:  Reason for admission: Reason for admission: contractions.  Reason for Admission:   nauseaContractions: Onset was yesterday.   Frequency: regular.   Duration is approximately 60 seconds.   Perceived severity is moderate.    Fetal activity: Perceived fetal activity is normal.   Last perceived fetal movement was within the past hour.    Prenatal complications: No bleeding, HIV, polyhydramnios or pre-eclampsia.   Patient has chronic Hepatitis B  Prenatal Complications - Diabetes: none.   Has history of PPH after first delivery requiring blood transfusion. OB History    Grav Para Term Preterm Abortions TAB SAB Ect Mult Living   4 1 0 1 2 1 0 1 0 1      Past Medical History  Diagnosis Date  . Hepatitis B antibody positive   . Anemia   . Mental disorder     depression and bipolar  . Bipolar 1 disorder     hx cutting   . Chlamydia   . S/P dilation and curettage     for TAB and ectopic   Past Surgical History  Procedure Date  . Liver biopsy   . No past surgeries   . Small intestine surgery     pt had intusseseption at 20 years old  . Dilation and curettage of uterus    Family History: family history is not on file.  She is adopted. Social History:  reports that she has never smoked. She has never used smokeless tobacco. She reports that she does not drink alcohol or use illicit drugs.  Review of Systems  Constitutional: Negative for fever.  Eyes: Negative for blurred vision.  Respiratory: Negative for cough and shortness of breath.   Cardiovascular: Negative for chest pain.  Gastrointestinal: Negative for heartburn, nausea and vomiting.  Genitourinary: Negative for dysuria.  Musculoskeletal: Negative for myalgias.  Skin: Negative for rash.  Neurological: Negative for dizziness and headaches.    Dilation: 4 Effacement (%): 90 Station: -3;-2 Exam by:: Dr Natale Milch Last  menstrual period 08/18/2010. Maternal Exam:  Uterine Assessment: Contraction strength is moderate.  Contraction duration is 60 seconds. Contraction frequency is regular.   Abdomen: Estimated fetal weight is 7 .5 pounds.   Fetal presentation: vertex  Introitus: Normal vulva. Normal vagina.  Pelvis: adequate for delivery.   Cervix: Cervix evaluated by digital exam.     Fetal Exam Fetal Monitor Review: Mode: ultrasound.   Baseline rate: 130s.  Variability: moderate (6-25 bpm).   Pattern: accelerations present and variable decelerations.    Fetal State Assessment: Category II - tracings are indeterminate.     Physical Exam  Constitutional: She is oriented to person, place, and time. She appears well-developed and well-nourished. She appears distressed.  HENT:  Head: Normocephalic.  Eyes: Pupils are equal, round, and reactive to light.  Neck: Normal range of motion.  Cardiovascular: Normal rate, regular rhythm, normal heart sounds and intact distal pulses.   No murmur heard. Respiratory: Effort normal and breath sounds normal. No respiratory distress. She has no wheezes.  GI: Soft. Bowel sounds are normal. She exhibits no distension.  Musculoskeletal: Normal range of motion.  Neurological: She is alert and oriented to person, place, and time. She has normal reflexes. She displays normal reflexes. No cranial nerve deficit. Coordination normal.  Skin: Skin is warm and dry. No rash noted. She is not diaphoretic.  Psychiatric: She has a normal mood and affect.    Prenatal  labs: ABO, Rh: --/--/A POS (05/23 1941) Antibody:   Rubella: Immune (05/01 0000) RPR: Nonreactive (05/01 0000)  HBsAg: Positive (05/01 0000)  HIV: Non-reactive (05/01 0000)  GBS: NEGATIVE (06/22 1330)   Assessment/Plan: Active labor at 39.2 wks G4P0121, Chronic Hepatitis B, history of PPH after first delivery requiring blood transfusion 1. Admit for L&D 2. GBS Neg 3. Desires Epidural 4. Infant will get Hep  B vaccine and HBIG after birth 5. No FSE 6. Desires to formula feed and plans for Mirena 7. Delivery team to have cytotec available at the time of delivery preemptively due to history of PPH  Lauren Moss N 05/20/2011, 9:14 AM

## 2011-05-20 NOTE — Progress Notes (Signed)
Attempted I & O catheter approximately 1810 notice labial laceration on right side. Moderate amount of bleeding from wound when manipulated. Notified Cam Hai CNM to assess pt at bedside. Will continue to monitor

## 2011-05-20 NOTE — Anesthesia Preprocedure Evaluation (Signed)
Anesthesia Evaluation  Name, MR# and DOB Patient awake  General Assessment Comment  Reviewed: Allergy & Precautions, H&P , Patient's Chart, lab work & pertinent test results  Airway Mallampati: II TM Distance: >3 FB Neck ROM: full    Dental No notable dental hx.    Pulmonary  clear to auscultation  Pulmonary exam normal       Cardiovascular regular Normal    Neuro/Psych PSYCHIATRIC DISORDERS Negative Neurological ROS  Negative Psych ROS   GI/Hepatic negative GI ROS Neg liver ROS  (+) B  Endo/Other  Negative Endocrine ROSMorbid obesity  Renal/GU negative Renal ROS     Musculoskeletal   Abdominal   Peds  Hematology negative hematology ROS (+)   Anesthesia Other Findings   Reproductive/Obstetrics (+) Pregnancy                           Anesthesia Physical Anesthesia Plan  ASA: III  Anesthesia Plan: Epidural   Post-op Pain Management:    Induction:   Airway Management Planned:   Additional Equipment:   Intra-op Plan:   Post-operative Plan:   Informed Consent: I have reviewed the patients History and Physical, chart, labs and discussed the procedure including the risks, benefits and alternatives for the proposed anesthesia with the patient or authorized representative who has indicated his/her understanding and acceptance.     Plan Discussed with:   Anesthesia Plan Comments:         Anesthesia Quick Evaluation

## 2011-05-20 NOTE — Progress Notes (Cosign Needed)
Pt states regular ctx's since 0700, denies bleeding or lof, +FM, q2-5 minutes apart, last pregnancy had "quick" delivery.

## 2011-05-20 NOTE — Anesthesia Procedure Notes (Signed)
Epidural Patient location during procedure: OB Start time: 05/20/2011 9:56 AM  Staffing Performed by: anesthesiologist   Preanesthetic Checklist Completed: patient identified, site marked, surgical consent, pre-op evaluation, timeout performed, IV checked, risks and benefits discussed and monitors and equipment checked  Epidural Patient position: sitting Prep: site prepped and draped and DuraPrep Patient monitoring: continuous pulse ox and blood pressure Approach: midline Injection technique: LOR air and LOR saline  Needle:  Needle type: Tuohy  Needle gauge: 17 G Needle length: 9 cm Needle insertion depth: 5 cm cm Catheter type: closed end flexible Catheter size: 19 Gauge Catheter at skin depth: 10 cm Test dose: negative  Assessment Events: blood not aspirated, injection not painful, no injection resistance, negative IV test and no paresthesia  Additional Notes Patient identified.  Risk benefits discussed including failed block, incomplete pain control, headache, nerve damage, paralysis, blood pressure changes, nausea, vomiting, reactions to medication both toxic or allergic, and postpartum back pain.  Patient expressed understanding and wished to proceed.  All questions were answered.  Sterile technique used throughout procedure and epidural site dressed with sterile barrier dressing. No paresthesia or other complications noted.The patient did not experience any signs of intravascular injection such as tinnitus or metallic taste in mouth nor signs of intrathecal spread such as rapid motor block. Please see nursing notes for vital signs.

## 2011-05-20 NOTE — Progress Notes (Signed)
Lauren Moss is a 20 y.o. [phone] at [redacted]w[redacted]d Subjective: Comfortable with epid; not feeling pressure; pt staring at TV while I was talking about the plan- able to respond and get her attention, but appears not to focus well  Objective: BP 118/66  Pulse 81  Temp(Src) 98.7 F (37.1 C) (Oral)  Resp 20  Ht 5\' 3"  (1.6 m)  Wt 89.359 kg (197 lb)  BMI 34.90 kg/m2  SpO2 97%  LMP 08/18/2010      FHT:  FHR: 130 bpm, variability: moderate,  accelerations:  Present,  decelerations:  Absent; currently appears in sleep cycle, but accels in last 30 mins UC:   regular, every 3-5 minutes SVE:   Dilation: 9 Effacement (%): 100 Station: 0 Exam by:: Lauren Moss CNM SROM with exam- clear Labs: Lab Results  Component Value Date   WBC 5.7 05/20/2011   HGB 10.3* 05/20/2011   HCT 32.2* 05/20/2011   MCV 86.6 05/20/2011   PLT 173 05/20/2011    Assessment / Plan: Spontaneous labor, progressing normally  Will reeval in 1-2 hours or sooner if feeling the urge to push Will get UDS as part of mental status assessment   Lauren Moss 05/20/2011, 3:25 PM

## 2011-05-20 NOTE — Procedures (Signed)
Indication: I was notified of continuous bleeding and pain in the right interior labia of Lauren Moss at approximately 18:15. Upon inspection, there was a 2 cm laceration of the labia resulting from birthing trauma. It was actively bleeding at the time of inspection.  Physician: Si Raider. Clinton Sawyer, M.D. Nurse: Cam Hai C.N.M.  Description: The patient was explained the need for repair with suture and provided a verbal acknowledgement and agreement. Anesthesia was Lidcocaine 10% , 13 mL injected subcutaneously. 3-0 vicryl stitch was used to close the wound.  Complications: None EBL: 300 mL Disposition: The patient's labial laceration was successfully closed and bleeding had ceased. Lauren Moss was stable and appropriate for transfer from the delivery suite to the postpartum floor.

## 2011-05-21 LAB — CBC
HCT: 29.1 % — ABNORMAL LOW (ref 36.0–46.0)
Hemoglobin: 9.4 g/dL — ABNORMAL LOW (ref 12.0–15.0)
MCH: 28 pg (ref 26.0–34.0)
MCHC: 32.3 g/dL (ref 30.0–36.0)
MCV: 86.6 fL (ref 78.0–100.0)
Platelets: 165 10*3/uL (ref 150–400)
RBC: 3.36 MIL/uL — ABNORMAL LOW (ref 3.87–5.11)
RDW: 14.5 % (ref 11.5–15.5)
WBC: 7.6 10*3/uL (ref 4.0–10.5)

## 2011-05-21 MED ORDER — IBUPROFEN 600 MG PO TABS: 600.0000 mg | ORAL_TABLET | Freq: Four times a day (QID) | ORAL | Status: AC

## 2011-05-21 NOTE — Anesthesia Postprocedure Evaluation (Signed)
Anesthesia Post Note  Patient: Lauren Moss  Procedure(s) Performed: * No procedures listed *  Anesthesia type: Epidural  Patient location: Mother/Baby  Post pain: Pain level controlled  Post assessment: Post-op Vital signs reviewed  Last Vitals:  Filed Vitals:   05/21/11 0616  BP: 112/69  Pulse: 75  Temp: 98.4 F (36.9 C)  Resp: 18    Post vital signs: Reviewed  Level of consciousness: awake  Complications: No apparent anesthesia complications

## 2011-05-21 NOTE — Discharge Summary (Addendum)
Obstetric Discharge Summary Reason for Admission: onset of labor Prenatal Procedures: none Intrapartum Procedures: spontaneous vaginal delivery Postpartum Procedures: none Complications-Operative and Postpartum: none Hemoglobin  Date Value Range Status  05/21/2011 9.4* 12.0-15.0 (g/dL) Final     HCT  Date Value Range Status  05/21/2011 29.1* 36.0-46.0 (%) Final    Discharge Diagnoses: Term Pregnancy-delivered  Discharge Information: Date: 05/21/2011 Activity: pelvic rest Diet: routine Medications: Ibuprofen Condition: stable Instructions: refer to practice specific booklet Discharge to: home SW consulted w/ CPS. Plan to F/U w/ pt PP for Hx of Bipolar Disorder, hallucinations. Pt reports stable mood and has established mental healthcare provider. BC: Mirena Bottle  Newborn Data: Live born female  Birth Weight: 6 lb 13.7 oz (3110 g) APGAR: 8, 9  Home with mother.  WALDEN,JEFF 05/21/2011, 10:43 AM  Addendum by  Dorathy Kinsman 05/22/2011 9:58 AM

## 2011-05-21 NOTE — Progress Notes (Signed)
UR chart review completed.  

## 2011-05-21 NOTE — Progress Notes (Addendum)
Post Partum Day 1 Subjective: voiding, tolerating PO and + flatus C/o of nasal stuffiness and dry cough since last night. Bleeding PV decreasing, baby doing well and bottle feeding. No other complaints   Objective: Blood pressure 112/69, pulse 75, temperature 98.4 F (36.9 C), temperature source Oral, resp. rate 18, height 5\' 3"  (1.6 m), weight 89.359 kg (197 lb), last menstrual period 08/18/2010, SpO2 97.00%, unknown if currently breastfeeding.   Physical Exam:  General: alert, cooperative, appears stated age and no distress Uterine Fundus: firm Incision: healing well, no significant drainage, no significant erythema DVT Evaluation: No evidence of DVT seen on physical exam. Labial laceration:  Healing well, no edema, no drainage, no bleeding.  Lac site suture, clean, dry, intact.  CBC    Component Value Date/Time   WBC 7.6 05/21/2011 0557   RBC 3.36* 05/21/2011 0557   HGB 9.4* 05/21/2011 0557   HCT 29.1* 05/21/2011 0557   PLT 165 05/21/2011 0557   MCV 86.6 05/21/2011 0557   MCH 28.0 05/21/2011 0557   MCHC 32.3 05/21/2011 0557   RDW 14.5 05/21/2011 0557   LYMPHSABS 1.1 10/12/2010 1733   MONOABS 0.8 10/12/2010 1733   EOSABS 0.0 10/12/2010 1733   BASOSABS 0.0 10/12/2010 1733     Basename 05/21/11 0557 05/20/11 1716  HGB 9.4* 9.3*  HCT 29.1* 28.9*    Assessment/Plan: Discharge home    LOS: 1 day   Chetan Kapat 05/21/2011, 8:46 AM    Examined patient.  Discussed with PA student.  Agree with above.   Renold Don MD 05/21/2011  1040 AM

## 2011-05-22 NOTE — Discharge Summary (Signed)
Attestation of Attending Supervision of Resident: Evaluation and management procedures were performed by the [facility] Resident under my supervision.  I have reviewed the resident's note, chart reviewed and agree with management and plan.  Amarion Portell A 05/22/2011 2:52 PM

## 2011-05-22 NOTE — Progress Notes (Signed)
Post Partum Day 2 Subjective: no complaints, voiding, tolerating PO and + flatus  and has had BM. Her nasal stuffiness has improved, no fever, bleeding PV has decreased, chosen Mirena for her her method of contraception and states her mood is stable. She states she has supported well and is looking forward to discharge. Baby is doing well and bottle fed  Objective: Blood pressure 117/63, pulse 75, temperature 98.4 F (36.9 C), temperature source Oral, resp. rate 18, height 5\' 3"  (1.6 m), weight 89.359 kg (197 lb), last menstrual period 08/18/2010, SpO2 97.00%, bottle feeding baby  Physical Exam:  General: alert, cooperative, appears stated age and no distress Uterine Fundus: firm DVT Evaluation: No evidence of DVT seen on physical exam.   Basename 05/21/11 0557 05/20/11 1716  HGB 9.4* 9.3*  HCT 29.1* 28.9*    Assessment/Plan: Discharge home and schedule for return visit for past partum care at about 5 weeks from today   LOS: 2 days   Chetan Kapat 05/22/2011, 7:59 AM

## 2011-05-22 NOTE — Progress Notes (Addendum)
CPS worker, Lauren Moss came to meet with the MOB and assess social situation, as pt has a history with CPS.  Charles told this Sw that infant can discharge home with the MOB and CPS will follow up with the family at home.  Sw will follow up with drug screen results and make referral if needed. UDS is negative, meconium is pending.

## 2011-05-23 NOTE — Progress Notes (Signed)
Pt has established care w/ [facility] for mental healthcare for Bipolar Disorder. [facility] Social Worker discussed pts Hx w/ CPS. CPS will F/U w/ pt after discharge.  Katrinka Blazing, Cortana Vanderford 05/23/2011 1:22 PM

## 2011-06-25 ENCOUNTER — Ambulatory Visit: Payer: Medicaid Other | Admitting: Physician Assistant

## 2011-09-28 ENCOUNTER — Encounter (HOSPITAL_COMMUNITY): Payer: Self-pay | Admitting: *Deleted

## 2011-09-28 ENCOUNTER — Emergency Department (HOSPITAL_COMMUNITY)
Admission: EM | Admit: 2011-09-28 | Discharge: 2011-09-29 | Disposition: A | Discharge status: Home / Self Care | Payer: Medicaid Other | Attending: Emergency Medicine | Admitting: Emergency Medicine

## 2011-09-28 DIAGNOSIS — O200 Threatened abortion: Secondary | ICD-10-CM | POA: Insufficient documentation

## 2011-09-28 DIAGNOSIS — O4690 Antepartum hemorrhage, unspecified, unspecified trimester: Secondary | ICD-10-CM | POA: Insufficient documentation

## 2011-09-28 NOTE — ED Notes (Addendum)
Pt in c/o vaginal bleeding that starting 4 days ago, states she tested positive on a home pregnancy test, states this would be her time for a period, states she tested positive in January, c/o generalized lower abd pain at this time

## 2011-09-29 ENCOUNTER — Emergency Department (HOSPITAL_COMMUNITY): Payer: Medicaid Other

## 2011-09-29 LAB — PREGNANCY, URINE: Preg Test, Ur: POSITIVE — AB

## 2011-09-29 LAB — URINALYSIS, ROUTINE W REFLEX MICROSCOPIC
Hgb urine dipstick: NEGATIVE
Leukocytes, UA: NEGATIVE
Nitrite: NEGATIVE
Protein, ur: NEGATIVE mg/dL
Specific Gravity, Urine: 1.026 (ref 1.005–1.030)
Urobilinogen, UA: 1 mg/dL (ref 0.0–1.0)

## 2011-09-29 LAB — ABO/RH: ABO/RH(D): A POS

## 2011-09-29 LAB — WET PREP, GENITAL

## 2011-09-29 NOTE — Discharge Instructions (Signed)
Threatened Miscarriage  Bleeding during the first 20 weeks of pregnancy is common. This is sometimes called a threatened miscarriage. This is a pregnancy that is threatening to end before the twentieth week of pregnancy. Often this bleeding stops with bed rest or decreased activities as suggested by your caregiver and the pregnancy continues without any more problems. You may be asked to not have sexual intercourse, have orgasms or use tampons until further notice. Sometimes a threatened miscarriage can progress to a complete or incomplete miscarriage. This may or may not require further treatment. Some miscarriages occur before a woman misses a menstrual period and knows she is pregnant.  Miscarriages occur in 15 to 20% of all pregnancies and usually occur during the first 13 weeks of the pregnancy. The exact cause of a miscarriage is usually never known. A miscarriage is natures way of ending a pregnancy that is abnormal or would not make it to term. There are some things that may put you at risk to have a miscarriage, such as:   Hormone problems.   Infection of the uterus or cervix.   Chronic illness, diabetes for example, especially if it is not controlled.   Abnormal shaped uterus.   Fibroids in the uterus.   Incompetent cervix (the cervix is too weak to hold the baby).   Smoking.   Drinking too much alcohol. It's best not to drink any alcohol when you are pregnant.   Taking illegal drugs.  TREATMENT   When a miscarriage becomes complete and all products of conception (all the tissue in the uterus) have been passed, often no treatment is needed. If you think you passed tissue, save it in a container and take it to your doctor for evaluation. If the miscarriage is incomplete (parts of the fetus or placenta remain in the uterus), further treatment may be needed. The most common reason for further treatment is continued bleeding (hemorrhage) because pregnancy tissue did not pass out of the uterus. This  often occurs if a miscarriage is incomplete. Tissue left behind may also become infected. Treatment usually is dilatation and curettage (the removal of the remaining products of pregnancy. This can be done by a simple sucking procedure (suction curettage) or a simple scraping of the inside of the uterus. This may be done in the hospital or in the caregiver's office. This is only done when your caregiver knows that there is no chance for the pregnancy to proceed to term. This is determined by physical examination, negative pregnancy test, falling pregnancy hormone count and/or, an ultrasound revealing a dead fetus.  Miscarriages are often a very emotional time for prospective mothers and fathers. This is not you or your partners fault. It did not occur because of an inadequacy in you or your partner. Nearly all miscarriages occur because the pregnancy has started off wrongly. At least half of these pregnancies have a chromosomal abnormality. It is almost always not inherited. Others may have developmental problems with the fetus or placenta. This does not always show up even when the products miscarried are studied under the microscope. The miscarriage is nearly always not your fault and it is not likely that you could have prevented it from happening. If you are having emotional and grieving problems, talk to your health care provider and even seek counseling, if necessary, before getting pregnant again. You can begin trying for another pregnancy as soon as your caregiver says it is OK.  HOME CARE INSTRUCTIONS    Your caregiver may order   and cramping you are having. You may be limited to only getting up to go to the bathroom. You may be allowed to continue light activity. You may need to make arrangements for the care of your other children and for any other responsibilities.   Keep track of the number of pads you use each day, how often you have to change pads  and how saturated (soaked) they are. Record this information.   DO NOT USE TAMPONS. Do not douche, have sexual intercourse or orgasms until approved by your caregiver.   You may receive a follow up appointment for re-evaluation of your pregnancy and a repeat blood test. Re-evaluation often occurs after 2 days and again in 4 to 6 weeks. It is very important that you follow-up in the recommended time period.   If you are Rh negative and the father is Rh positive or you do not know the fathers' blood type, you may receive a shot (Rh immune globulin) to help prevent abnormal antibodies that can develop and affect the baby in any future pregnancies.  SEEK IMMEDIATE MEDICAL CARE IF:  You have severe cramps in your stomach, back, or abdomen.   You have a sudden onset of severe pain in the lower part of your abdomen.   You develop chills.   You run an unexplained temperature of 101 F (38.3 C) or higher.   You pass large clots or tissue. Save any tissue for your caregiver to inspect.   Your bleeding increases or you become light-headed, weak, or have fainting episodes.   You have a gush of fluid from your vagina.   You pass out. This could mean you have a tubal (ectopic) pregnancy.  Document Released: 07/21/2005 Document Revised: 04/02/2011 Document Reviewed: 03/06/2008 [facility] Patient Information 2012 [place], [place].Vaginal Bleeding During Pregnancy, First Trimester A small amount of bleeding (spotting) is relatively common in early pregnancy. It usually stops on its own. There are many causes for bleeding or spotting in early pregnancy. Some bleeding may be related to the pregnancy and some may not. Cramping with the bleeding is more serious and concerning. Tell your caregiver if you have any vaginal bleeding.  CAUSES   It is normal in most cases.   The pregnancy ends (miscarriage).   The pregnancy may end (threatened miscarriage).   Infection or inflammation of the cervix.    Growths (polyps) on the cervix.   Pregnancy happens outside of the uterus and in a fallopian tube (tubal pregnancy).   Many tiny cysts in the uterus instead of pregnancy tissue (molar pregnancy).  SYMPTOMS  Vaginal bleeding or spotting with or without cramps. DIAGNOSIS  To evaluate the pregnancy, your caregiver may:  Do a pelvic exam.   Take blood tests.   Do an ultrasound.  It is very important to follow your caregiver's instructions.  TREATMENT   Evaluation of the pregnancy with blood tests and ultrasound.   Bed rest (getting up to use the bathroom only).   Rho-gam immunization if the mother is Rh negative and the father is Rh positive.  HOME CARE INSTRUCTIONS   If your caregiver orders bed rest, you may need to make arrangements for the care of other children and for other responsibilities. However, your caregiver may allow you to continue light activity.   Keep track of the number of pads you use each day, how often you change pads and how soaked (saturated) they are. Write this down.   Do not use tampons. Do not douche.  Do not have sexual intercourse or orgasms until approved by your physician.   Save any tissue that you pass for your caregiver to see.   Take medicine for cramps only with your caregiver's permission.   Do not take aspirin because it can make you bleed.  SEEK IMMEDIATE MEDICAL CARE IF:   You experience severe cramps in your stomach, back or belly (abdomen).   You have an oral temperature above 102 F (38.9 C), not controlled by medicine.   You pass large clots or tissue.   Your bleeding increases or you become light-headed, weak or have fainting episodes.   You develop chills.   You are leaking or have a gush of fluid from your vagina.   You pass out while having a bowel movement. That may mean you have a ruptured tubal pregnancy.  Document Released: 04/30/2005 Document Revised: 04/02/2011 Document Reviewed: 11/09/2008 [facility]  Patient Information 2012 [place], [place].

## 2011-09-29 NOTE — ED Provider Notes (Signed)
History     CSN: [id number]  Arrival date & time 09/28/11  2201   First MD Initiated Contact with Patient 09/29/11 0117      Chief Complaint  Patient presents with  . Vaginal Bleeding  . Possible Pregnancy    (Consider location/radiation/quality/duration/timing/severity/associated sxs/prior treatment) HPI Comments: Patient here after positive home pregnancy test and bleeding that started 4 days ago - states that this feels like her normal period - she is 5 months post partum with second child - reports no fever, chills, abdominal pain.  States initially tested positive in January for the pregnancy - she thinks she may have miscarried though she had no cramping or pain.  Patient is a 21 y.o. female presenting with vaginal bleeding. The history is provided by the patient. No language interpreter was used.  Vaginal Bleeding This is a new problem. The current episode started in the past 7 days. The problem occurs constantly. The problem has been unchanged. Pertinent negatives include no abdominal pain, anorexia, arthralgias, change in bowel habit, chest pain, chills, congestion, coughing, diaphoresis, fatigue, fever, headaches, joint swelling, myalgias, nausea, neck pain, numbness, rash, sore throat, swollen glands, urinary symptoms, vertigo, visual change, vomiting or weakness. The symptoms are aggravated by nothing. She has tried nothing for the symptoms. The treatment provided no relief.  Vaginal Bleeding This is a new problem. The current episode started in the past 7 days. The problem occurs constantly. The problem has been unchanged. Pertinent negatives include no chest pain, no abdominal pain and no headaches. The symptoms are aggravated by nothing. She has tried nothing for the symptoms. The treatment provided no relief.    Past Medical History  Diagnosis Date  . Hepatitis B antibody positive   . Anemia   . Mental disorder     depression and bipolar  . Bipolar 1 disorder     hx  cutting   . Chlamydia   . S/P dilation and curettage     for TAB and ectopic    Past Surgical History  Procedure Date  . Liver biopsy   . No past surgeries   . Small intestine surgery     pt had intusseseption at 21 years old  . Dilation and curettage of uterus     Family History  Problem Relation Age of Onset  . Adopted: Yes    History  Substance Use Topics  . Smoking status: Never Smoker   . Smokeless tobacco: Never Used  . Alcohol Use: No    OB History    Grav Para Term Preterm Abortions TAB SAB Ect Mult Living   4 2 1 1 2 1  0 1 0 2      Review of Systems  Constitutional: Negative for fever, chills, diaphoresis and fatigue.  HENT: Negative for congestion, sore throat and neck pain.   Respiratory: Negative for cough.   Cardiovascular: Negative for chest pain.  Gastrointestinal: Negative for nausea, vomiting, abdominal pain, anorexia and change in bowel habit.  Genitourinary: Positive for vaginal bleeding. Negative for dysuria, vaginal discharge, vaginal pain, menstrual problem and pelvic pain.  Musculoskeletal: Negative for myalgias, joint swelling and arthralgias.  Skin: Negative for rash.  Neurological: Negative for vertigo, weakness, numbness and headaches.  All other systems reviewed and are negative.    Allergies  Percocet and Vicodin  Home Medications   Current Outpatient Rx  Name Route Sig Dispense Refill  . SERTRALINE HCL 100 MG PO TABS Oral Take 100 mg by mouth daily.    Marland Kitchen  TRAZODONE HCL 150 MG PO TABS Oral Take 150 mg by mouth at bedtime.      BP 134/78  Pulse 60  Temp(Src) 98.3 F (36.8 C) (Oral)  Resp 18  SpO2 99%  Breastfeeding? Unknown  Physical Exam  Nursing note and vitals reviewed. Constitutional: She is oriented to person, place, and time. She appears well-developed and well-nourished. No distress.  HENT:  Head: Normocephalic and atraumatic.  Right Ear: External ear normal.  Left Ear: External ear normal.  Nose: Nose normal.    Mouth/Throat: Oropharynx is clear and moist. No oropharyngeal exudate.  Eyes: Conjunctivae are normal. Pupils are equal, round, and reactive to light. No scleral icterus.  Neck: Normal range of motion. Neck supple.  Cardiovascular: Normal rate, regular rhythm and normal heart sounds.  Exam reveals no gallop and no friction rub.   No murmur heard. Pulmonary/Chest: Effort normal and breath sounds normal. No respiratory distress. She exhibits no tenderness.  Abdominal: Soft. Bowel sounds are normal. She exhibits no distension. There is no tenderness.  Genitourinary: There is no rash or tenderness on the right labia. There is no rash on the left labia. Uterus is not enlarged and not tender. Cervix exhibits no motion tenderness, no discharge and no friability. Right adnexum displays no mass and no tenderness. Left adnexum displays no mass and no tenderness. There is bleeding around the vagina. No tenderness around the vagina. No vaginal discharge found.       Small amount blood in vaginal vault - no clots.  Musculoskeletal: Normal range of motion. She exhibits no edema and no tenderness.  Lymphadenopathy:    She has no cervical adenopathy.  Neurological: She is alert and oriented to person, place, and time. No cranial nerve deficit.  Skin: Skin is warm and dry. No rash noted. No erythema. No pallor.  Psychiatric: She has a normal mood and affect. Her behavior is normal. Judgment and thought content normal.    ED Course  Procedures (including critical care time)  Labs Reviewed  PREGNANCY, URINE - Abnormal; Notable for the following:    Preg Test, Ur POSITIVE (*)    All other components within normal limits  WET PREP, GENITAL - Abnormal; Notable for the following:    Clue Cells Wet Prep HPF POC FEW (*)    WBC, Wet Prep HPF POC FEW (*)    All other components within normal limits  HCG, QUANTITATIVE, PREGNANCY - Abnormal; Notable for the following:    hCG, Beta Chain, Quant, S 44 (*)    All  other components within normal limits  URINALYSIS, ROUTINE W REFLEX MICROSCOPIC  ABO/RH  GC/CHLAMYDIA PROBE AMP, GENITAL   US Ob Comp Less 14 Wks  09/29/2011  *RADIOLOGY REPORT*  Clinical Data: Vaginal bleeding.  OBSTETRIC <14 WK [place] AND TRANSVAGINAL OB US  Technique:  Both transabdominal and transvaginal ultrasound examinations were performed for complete evaluation of the gestation as well as the maternal uterus, adnexal regions, and pelvic cul-de-sac.  Transvaginal technique was performed to assess early pregnancy.  Comparison:  Prior ultrasound of pregnancy performed 12/25/2010  Intrauterine gestational sac:  Visualized/normal in shape. Yolk sac: No Embryo: No Cardiac Activity: N/A  MSD: 3.6  mm  5    w 1    d         [place] EDC: 05/30/2012  Maternal uterus/adnexae: No subchorionic hemorrhage is noted.  The uterus is unremarkable in appearance, measuring 8.7 x 4.6 x 5.6 cm.  The ovaries are within normal limits.  The right ovary measures 2.9 x 2.0 x 2.0 cm, while the left ovary measures 3.0 x 1.8 x 1.6 cm. No suspicious adnexal masses are seen; there is no evidence for ovarian torsion.  No free fluid is seen within the pelvic cul-de-sac.  IMPRESSION: Single tiny apparent intrauterine gestational sac noted, with a mean sac diameter of 3.6 mm, corresponding to a gestational age of [redacted] weeks 1 day.  This corresponds to the gestational age of [redacted] weeks 3 days by LMP, reflecting an estimated date of delivery of May 28, 2012.  Original Report Authenticated By: Tonia Ghent, M.D.   US Ob Transvaginal  09/29/2011  *RADIOLOGY REPORT*  Clinical Data: Vaginal bleeding.  OBSTETRIC <14 WK [place] AND TRANSVAGINAL OB US  Technique:  Both transabdominal and transvaginal ultrasound examinations were performed for complete evaluation of the gestation as well as the maternal uterus, adnexal regions, and pelvic cul-de-sac.  Transvaginal technique was performed to assess early pregnancy.  Comparison:  Prior ultrasound of pregnancy  performed 12/25/2010  Intrauterine gestational sac:  Visualized/normal in shape. Yolk sac: No Embryo: No Cardiac Activity: N/A  MSD: 3.6  mm  5    w 1    d         [place] EDC: 05/30/2012  Maternal uterus/adnexae: No subchorionic hemorrhage is noted.  The uterus is unremarkable in appearance, measuring 8.7 x 4.6 x 5.6 cm.  The ovaries are within normal limits.  The right ovary measures 2.9 x 2.0 x 2.0 cm, while the left ovary measures 3.0 x 1.8 x 1.6 cm. No suspicious adnexal masses are seen; there is no evidence for ovarian torsion.  No free fluid is seen within the pelvic cul-de-sac.  IMPRESSION: Single tiny apparent intrauterine gestational sac noted, with a mean sac diameter of 3.6 mm, corresponding to a gestational age of [redacted] weeks 1 day.  This corresponds to the gestational age of [redacted] weeks 3 days by LMP, reflecting an estimated date of delivery of May 28, 2012.  Original Report Authenticated By: Tonia Ghent, M.D.   Results for orders placed during the hospital encounter of 09/28/11  PREGNANCY, URINE      Component Value Range   Preg Test, Ur POSITIVE (*) NEGATIVE   URINALYSIS, ROUTINE W REFLEX MICROSCOPIC      Component Value Range   Color, Urine YELLOW  YELLOW    APPearance CLEAR  CLEAR    Specific Gravity, Urine 1.026  1.005 - 1.030    pH 6.0  5.0 - 8.0    Glucose, UA NEGATIVE  NEGATIVE (mg/dL)   Hgb urine dipstick NEGATIVE  NEGATIVE    Bilirubin Urine NEGATIVE  NEGATIVE    Ketones, ur NEGATIVE  NEGATIVE (mg/dL)   Protein, ur NEGATIVE  NEGATIVE (mg/dL)   Urobilinogen, UA 1.0  0.0 - 1.0 (mg/dL)   Nitrite NEGATIVE  NEGATIVE    Leukocytes, UA NEGATIVE  NEGATIVE   WET PREP, GENITAL      Component Value Range   Yeast Wet Prep HPF POC NONE SEEN  NONE SEEN    Trich, Wet Prep NONE SEEN  NONE SEEN    Clue Cells Wet Prep HPF POC FEW (*) NONE SEEN    WBC, Wet Prep HPF POC FEW (*) NONE SEEN   HCG, QUANTITATIVE, PREGNANCY      Component Value Range   hCG, Beta Chain, Quant, S 44 (*) <5 (mIU/mL)    ABO/RH      Component Value Range   ABO/RH(D) A POS  US Ob Comp Less 14 Wks  09/29/2011  *RADIOLOGY REPORT*  Clinical Data: Vaginal bleeding.  OBSTETRIC <14 WK [place] AND TRANSVAGINAL OB US  Technique:  Both transabdominal and transvaginal ultrasound examinations were performed for complete evaluation of the gestation as well as the maternal uterus, adnexal regions, and pelvic cul-de-sac.  Transvaginal technique was performed to assess early pregnancy.  Comparison:  Prior ultrasound of pregnancy performed 12/25/2010  Intrauterine gestational sac:  Visualized/normal in shape. Yolk sac: No Embryo: No Cardiac Activity: N/A  MSD: 3.6  mm  5    w 1    d         [place] EDC: 05/30/2012  Maternal uterus/adnexae: No subchorionic hemorrhage is noted.  The uterus is unremarkable in appearance, measuring 8.7 x 4.6 x 5.6 cm.  The ovaries are within normal limits.  The right ovary measures 2.9 x 2.0 x 2.0 cm, while the left ovary measures 3.0 x 1.8 x 1.6 cm. No suspicious adnexal masses are seen; there is no evidence for ovarian torsion.  No free fluid is seen within the pelvic cul-de-sac.  IMPRESSION: Single tiny apparent intrauterine gestational sac noted, with a mean sac diameter of 3.6 mm, corresponding to a gestational age of [redacted] weeks 1 day.  This corresponds to the gestational age of [redacted] weeks 3 days by LMP, reflecting an estimated date of delivery of May 28, 2012.  Original Report Authenticated By: Tonia Ghent, M.D.   US Ob Transvaginal  09/29/2011  *RADIOLOGY REPORT*  Clinical Data: Vaginal bleeding.  OBSTETRIC <14 WK [place] AND TRANSVAGINAL OB US  Technique:  Both transabdominal and transvaginal ultrasound examinations were performed for complete evaluation of the gestation as well as the maternal uterus, adnexal regions, and pelvic cul-de-sac.  Transvaginal technique was performed to assess early pregnancy.  Comparison:  Prior ultrasound of pregnancy performed 12/25/2010  Intrauterine gestational sac:   Visualized/normal in shape. Yolk sac: No Embryo: No Cardiac Activity: N/A  MSD: 3.6  mm  5    w 1    d         [place] EDC: 05/30/2012  Maternal uterus/adnexae: No subchorionic hemorrhage is noted.  The uterus is unremarkable in appearance, measuring 8.7 x 4.6 x 5.6 cm.  The ovaries are within normal limits.  The right ovary measures 2.9 x 2.0 x 2.0 cm, while the left ovary measures 3.0 x 1.8 x 1.6 cm. No suspicious adnexal masses are seen; there is no evidence for ovarian torsion.  No free fluid is seen within the pelvic cul-de-sac.  IMPRESSION: Single tiny apparent intrauterine gestational sac noted, with a mean sac diameter of 3.6 mm, corresponding to a gestational age of [redacted] weeks 1 day.  This corresponds to the gestational age of [redacted] weeks 3 days by LMP, reflecting an estimated date of delivery of May 28, 2012.  Original Report Authenticated By: Tonia Ghent, M.D.      IUP Threatened abortion   MDM  Patient here with vaginal bleeding but evidence of IUP of 5 weeks - HCG is low, and this could mean early miscarriage.  Patient is aware and will follow up with OB this week.        Lauren Moss [place], [place] 09/29/11 [phone]   Medical screening examination/treatment/procedure(s) were performed by non-physician practitioner and as supervising physician I was immediately available for consultation/collaboration.  Sunnie Nielsen, MD 09/29/11 [phone]

## 2011-09-29 NOTE — ED Notes (Signed)
Pt to US.

## 2011-09-30 LAB — GC/CHLAMYDIA PROBE AMP, GENITAL
Chlamydia, DNA Probe: NEGATIVE
GC Probe Amp, Genital: NEGATIVE

## 2011-10-29 IMAGING — US US FETAL BPP W/O NONSTRESS
1 series · 12 of 12 positions shown · non-contrast
Comparison: none

OBSTETRICAL ULTRASOUND:
 This ultrasound exam was performed in the [HOSPITAL] Ultrasound Department.  The OB US report was generated in the [facility] system, and faxed to the ordering physician.  This report is also available in [HOSPITAL]?s [employer] and in [REDACTED] PACS.

[Series 1: us fetal bpp w/o nonstress · non-contrast · 0.27mm/px · 12 of 12 slices shown]
[im 1/12]
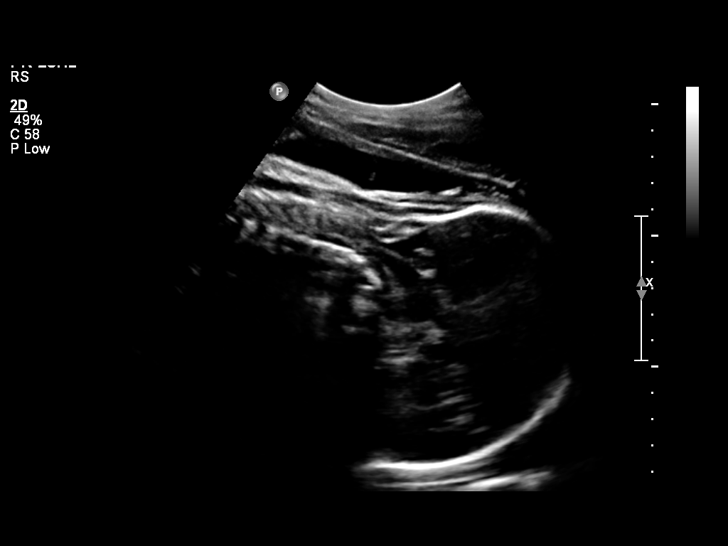
[im 2/12]
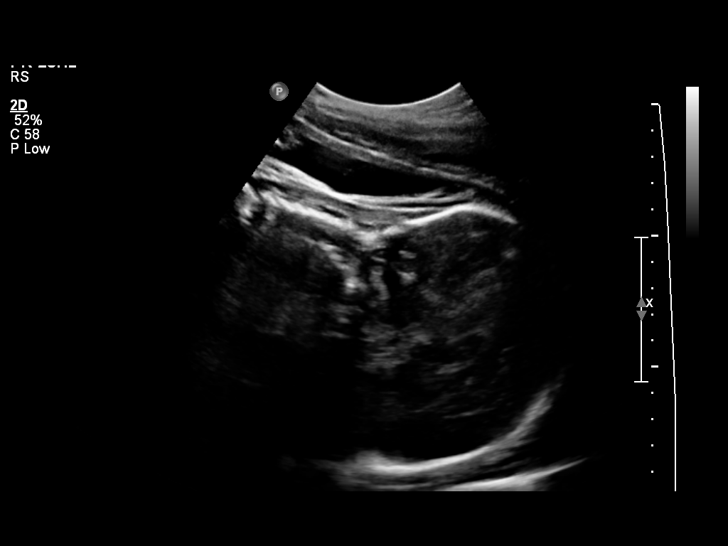
[im 3/12]
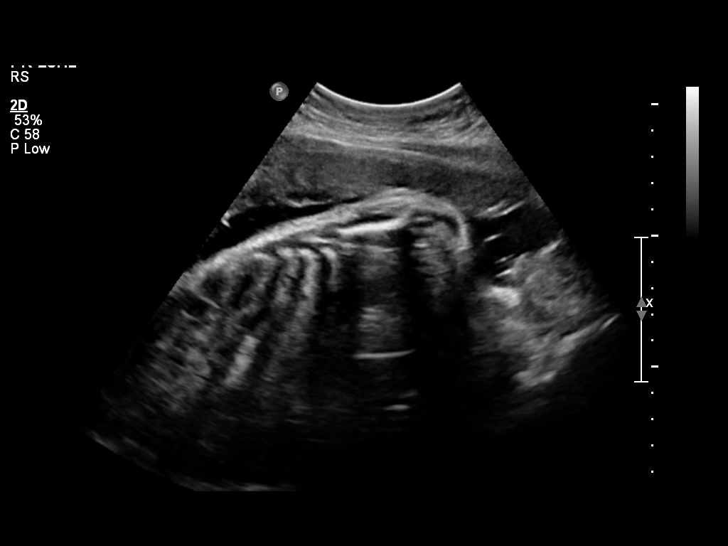
[im 4/12]
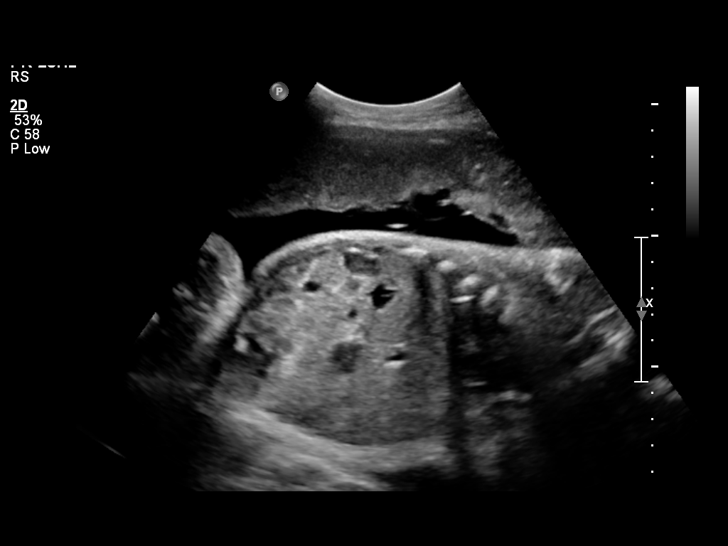
[im 5/12]
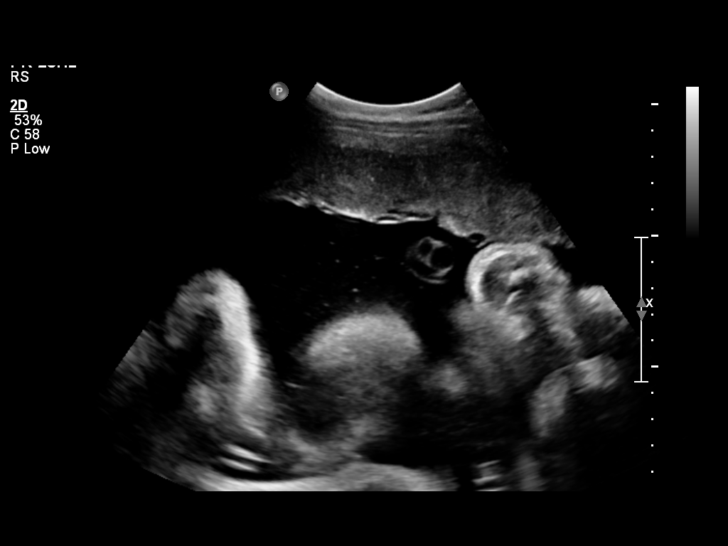
[im 6/12]
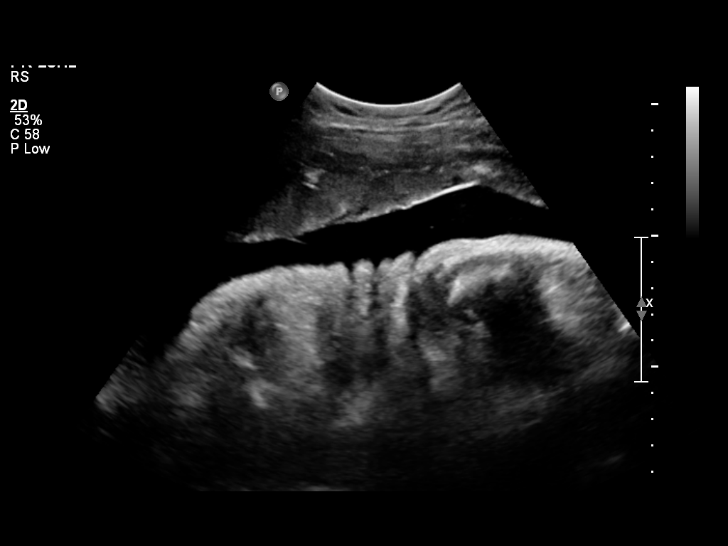
[im 7/12]
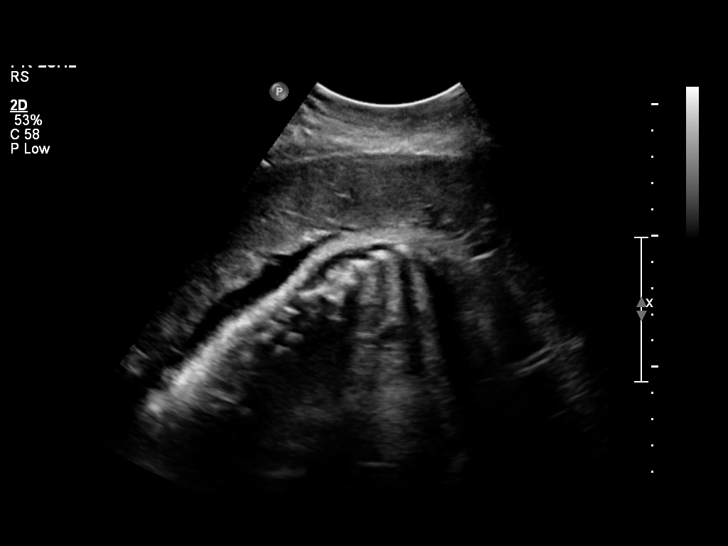
[im 8/12]
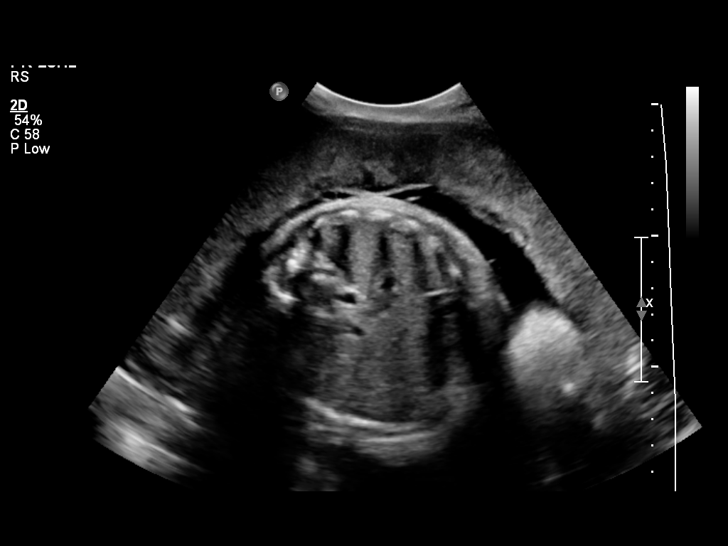
[im 9/12]
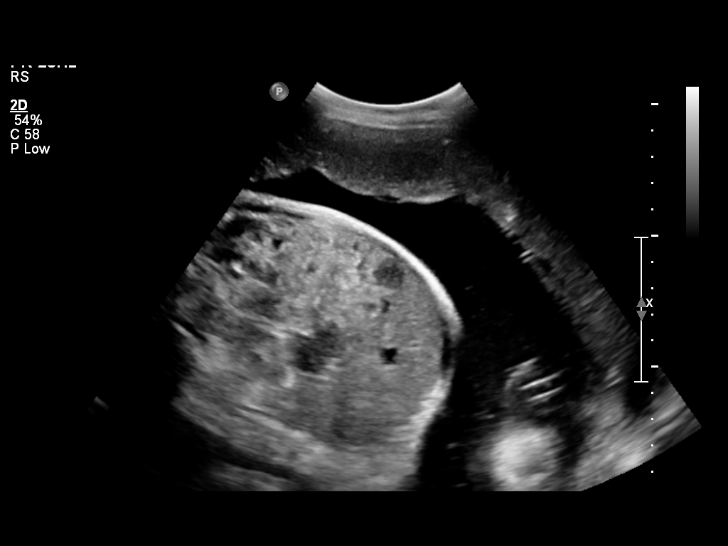
[im 10/12]
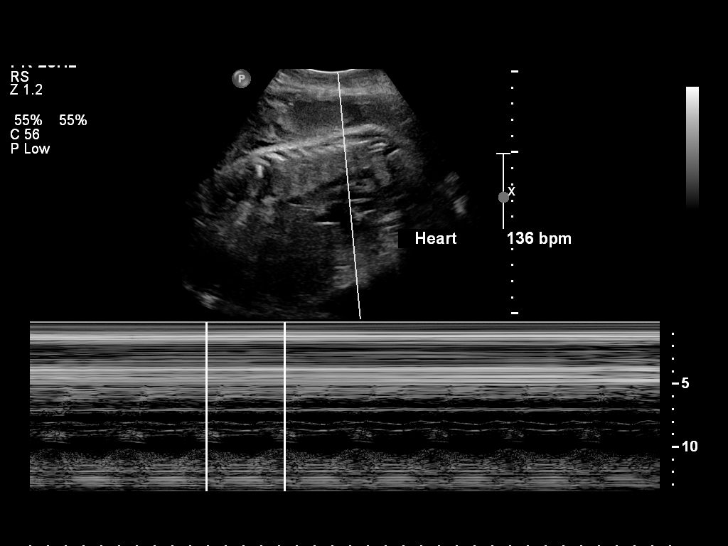
[im 11/12]
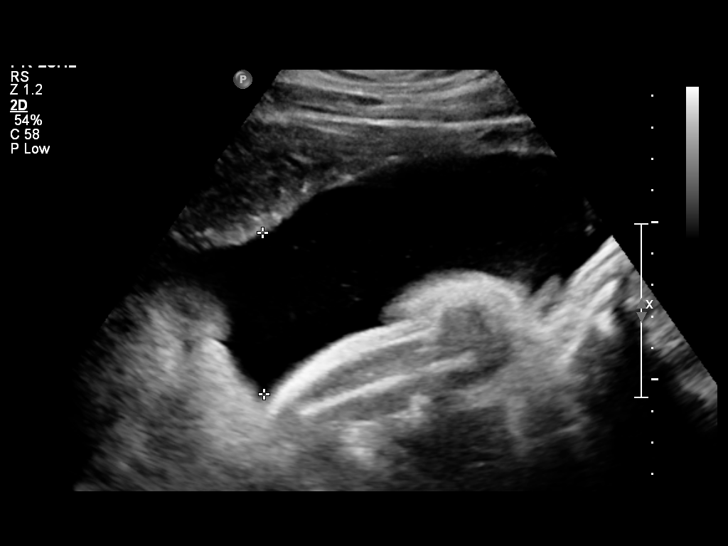
[im 12/12]
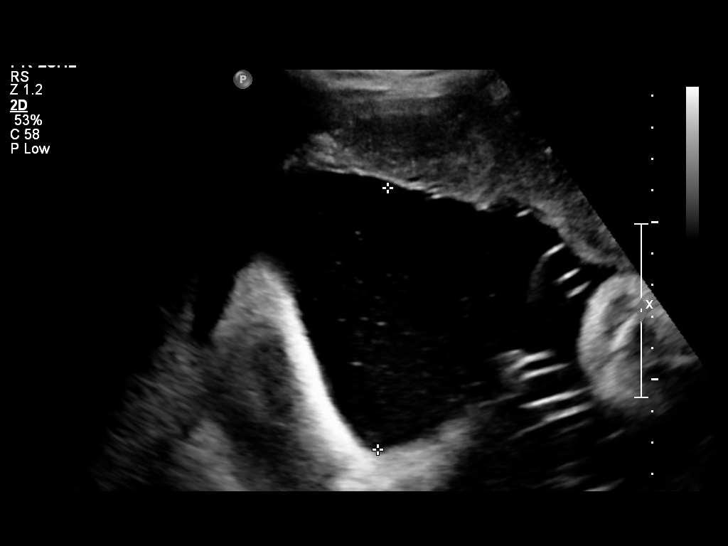

[12 of 12 positions shown; findings below may reference images not displayed]

IMPRESSION: See [facility] Obstetric US report.

## 2012-01-26 ENCOUNTER — Encounter (HOSPITAL_COMMUNITY): Payer: Self-pay | Admitting: Emergency Medicine

## 2012-01-26 DIAGNOSIS — Z885 Allergy status to narcotic agent status: Secondary | ICD-10-CM | POA: Insufficient documentation

## 2012-01-26 DIAGNOSIS — B1910 Unspecified viral hepatitis B without hepatic coma: Secondary | ICD-10-CM | POA: Insufficient documentation

## 2012-01-26 DIAGNOSIS — F319 Bipolar disorder, unspecified: Secondary | ICD-10-CM | POA: Insufficient documentation

## 2012-01-26 DIAGNOSIS — N898 Other specified noninflammatory disorders of vagina: Secondary | ICD-10-CM | POA: Insufficient documentation

## 2012-01-26 DIAGNOSIS — D649 Anemia, unspecified: Secondary | ICD-10-CM | POA: Insufficient documentation

## 2012-01-26 DIAGNOSIS — A749 Chlamydial infection, unspecified: Secondary | ICD-10-CM | POA: Insufficient documentation

## 2012-01-26 NOTE — ED Notes (Signed)
Pt reports positive Home preg test 1 week ago and shortly after started having bleeding small to moderate amount that has stayed constant until today when it lessened. She also reports severe abd / pelvic cramping that really concerns her.

## 2012-01-27 ENCOUNTER — Emergency Department (HOSPITAL_COMMUNITY)
Admission: EM | Admit: 2012-01-27 | Discharge: 2012-01-27 | Disposition: A | Discharge status: Home / Self Care | Payer: Self-pay | Attending: Emergency Medicine | Admitting: Emergency Medicine

## 2012-01-27 DIAGNOSIS — N939 Abnormal uterine and vaginal bleeding, unspecified: Secondary | ICD-10-CM

## 2012-01-27 LAB — URINALYSIS, ROUTINE W REFLEX MICROSCOPIC
Glucose, UA: NEGATIVE mg/dL
Ketones, ur: 15 mg/dL — AB
Protein, ur: 30 mg/dL — AB
pH: 6.5 (ref 5.0–8.0)

## 2012-01-27 LAB — CBC
HCT: 42.6 % (ref 36.0–46.0)
Hemoglobin: 14.1 g/dL (ref 12.0–15.0)
MCH: 29.1 pg (ref 26.0–34.0)
MCHC: 33.1 g/dL (ref 30.0–36.0)
RBC: 4.84 MIL/uL (ref 3.87–5.11)

## 2012-01-27 LAB — BASIC METABOLIC PANEL
BUN: 12 mg/dL (ref 6–23)
CO2: 26 mEq/L (ref 19–32)
Glucose, Bld: 108 mg/dL — ABNORMAL HIGH (ref 70–99)
Potassium: 3.9 mEq/L (ref 3.5–5.1)
Sodium: 138 mEq/L (ref 135–145)

## 2012-01-27 LAB — URINE MICROSCOPIC-ADD ON

## 2012-01-27 NOTE — ED Notes (Addendum)
Patient with positive pregnancy test from home yesterday.  Scant amount of bleeding, pink tinged.  LMP 11/19/11.  Patient with abdominal cramping at this time.

## 2012-01-27 NOTE — Discharge Instructions (Signed)
It is very important that he followup in the clinic as discussed. Call in the morning to schedule followup with women's clinic.  Normally, menstrual periods begin between ages 20 to 40 in young women. A normal menstrual cycle/period may begin every 23 days up to 35 days and lasts from 1 to 7 days. Around 12 to 14 days before your menstrual period starts, ovulation (ovary produces an egg) occurs. When counting the time between menstrual periods, count from the first day of bleeding of the previous period to the first day of bleeding of the next period.  Dysfunctional (abnormal) uterine bleeding is bleeding that is different from a normal menstrual period. Your periods may come earlier or later than usual. They may be lighter, have blood clots or be heavier. You may have bleeding between periods, or you may skip one period or more. You may have bleeding after sexual intercourse, bleeding after menopause, or no menstrual period.  CAUSES  Pregnancy (normal, miscarriage, tubal).  IUDs (intrauterine device, birth control).  Birth control pills.  Hormone treatment.  Menopause.  Infection of the cervix.  Blood clotting problems.  Infection of the inside lining of the uterus.  Endometriosis, inside lining of the uterus growing in the pelvis and other female organs.  Adhesions (scar tissue) inside the uterus.  Obesity or severe weight loss.  Uterine polyps inside the uterus.  Cancer of the vagina, cervix, or uterus.  Ovarian cysts or polycystic ovary syndrome.  Medical problems (diabetes, thyroid disease).  Uterine fibroids (noncancerous tumor).  Problems with your female hormones.  Endometrial hyperplasia, very thick lining and enlarged cells inside of the uterus.  Medicines that interfere with ovulation.  Radiation to the pelvis or abdomen.  Chemotherapy.  DIAGNOSIS  Your doctor will discuss the history of your menstrual periods, medicines you are taking, changes in your weight, stress in your  life, and any medical problems you may have.  Your doctor will do a physical and pelvic examination.  Your doctor may want to perform certain tests to make a diagnosis, such as:  Pap test.  Blood tests.  Cultures for infection.  CT scan.  Ultrasound.  Hysteroscopy.  Laparoscopy.  MRI.  Hysterosalpingography.  D and C.  Endometrial biopsy.  TREATMENT  Treatment will depend on the cause of the dysfunctional uterine bleeding (DUB). Treatment may include:  Observing your menstrual periods for a couple of months.  Prescribing medicines for medical problems, including:  Antibiotics.  Hormones.  Birth control pills.  Removing an IUD (intrauterine device, birth control).  Surgery:  D and C (scrape and remove tissue from inside the uterus).  Laparoscopy (examine inside the abdomen with a lighted tube).  Uterine ablation (destroy lining of the uterus with electrical current, laser, heat, or freezing).  Hysteroscopy (examine cervix and uterus with a lighted tube).  Hysterectomy (remove the uterus).  HOME CARE INSTRUCTIONS  If medicines were prescribed, take exactly as directed. Do not change or switch medicines without consulting your caregiver.  Long term heavy bleeding may result in iron deficiency. Your caregiver may have prescribed iron pills. They help replace the iron that your body lost from heavy bleeding. Take exactly as directed.  Do not take aspirin or medicines that contain aspirin one week before or during your menstrual period. Aspirin may make the bleeding worse.  If you need to change your sanitary pad or tampon more than once every 2 hours, stay in bed with your feet elevated and a cold pack on your lower abdomen.  Rest as much as possible, until the bleeding stops or slows down.  Eat well-balanced meals. Eat foods high in iron. Examples are:  Leafy green vegetables.  Whole-grain breads and cereals.  Eggs.  Meat.  Liver.  Do not try to lose weight until the abnormal  bleeding has stopped and your blood iron level is back to normal. Do not lift more than ten pounds or do strenuous activities when you are bleeding.  For a couple of months, make note on your calendar, marking the start and ending of your period, and the type of bleeding (light, medium, heavy, spotting, clots or missed periods). This is for your caregiver to better evaluate your problem.  SEEK MEDICAL CARE IF:  You develop nausea (feeling sick to your stomach) and vomiting, dizziness, or diarrhea while you are taking your medicine.  You are getting lightheaded or weak.  You have any problems that may be related to the medicine you are taking.  You develop pain with your DUB.  You want to remove your IUD.  You want to stop or change your birth control pills or hormones.  You have any type of abnormal bleeding mentioned above.  You are over 61 years old and have not had a menstrual period yet.  You are 21 years old and you are still having menstrual periods.  You have any of the symptoms mentioned above.  You develop a rash.  SEEK IMMEDIATE MEDICAL CARE IF:  An oral temperature above 102 F (38.9 C) develops.  You develop chills.  You are changing your sanitary pad or tampon more than once an hour.  You develop abdominal pain.  You pass out or faint.

## 2012-01-27 NOTE — ED Provider Notes (Signed)
History     CSN: [id number]  Arrival date & time 01/26/12  2228   First MD Initiated Contact with Patient 01/27/12 0030      Chief Complaint  Patient presents with  . Vaginal Bleeding    (Consider location/radiation/quality/duration/timing/severity/associated sxs/prior treatment) The history is provided by the patient.   patient is a very poor historian. She initially reports multiple home pregnancy tests with one positive and concern that she may be pregnant at this time. Last normal menstrual period was a month ago. No abdominal pain or back pain. No vaginal discharge. No fevers or chills. No nausea vomiting or diarrhea. Patient is very concerned that she may be pregnant. After initial evaluation patient states she had a recent miscarriage and also has a healthy 35-month-old child at home. Mild/moderate severity. Bleeding started today and is described as mild without clots.  Past Medical History  Diagnosis Date  . Hepatitis B antibody positive   . Anemia   . Mental disorder     depression and bipolar  . Bipolar 1 disorder     hx cutting   . Chlamydia   . S/P dilation and curettage     for TAB and ectopic    Past Surgical History  Procedure Date  . Liver biopsy   . No past surgeries   . Small intestine surgery     pt had intusseseption at 22 years old  . Dilation and curettage of uterus     Family History  Problem Relation Age of Onset  . Adopted: Yes    History  Substance Use Topics  . Smoking status: Never Smoker   . Smokeless tobacco: Never Used  . Alcohol Use: No    OB History    Grav Para Term Preterm Abortions TAB SAB Ect Mult Living   4 2 1 1 2 1  0 1 0 2      Review of Systems  Constitutional: Negative for fever and chills.  HENT: Negative for neck pain and neck stiffness.   Eyes: Negative for pain.  Respiratory: Negative for shortness of breath.   Cardiovascular: Negative for chest pain.  Gastrointestinal: Negative for abdominal pain.    Genitourinary: Positive for vaginal bleeding. Negative for dysuria, vaginal discharge and vaginal pain.  Musculoskeletal: Negative for back pain.  Skin: Negative for rash.  Neurological: Negative for headaches.  All other systems reviewed and are negative.    Allergies  Percocet and Vicodin  Home Medications   Current Outpatient Rx  Name Route Sig Dispense Refill  . SERTRALINE HCL 100 MG PO TABS Oral Take 100 mg by mouth daily.    . TRAZODONE HCL 150 MG PO TABS Oral Take 150 mg by mouth at bedtime.      BP 120/66  Pulse 74  Temp 98.3 F (36.8 C) (Oral)  Resp 18  SpO2 100%  Breastfeeding? Unknown  Physical Exam  Constitutional: She is oriented to person, place, and time. She appears well-developed and well-nourished.  HENT:  Head: Normocephalic and atraumatic.  Eyes: Conjunctivae and EOM are normal. Pupils are equal, round, and reactive to light.  Neck: Trachea normal. Neck supple. No thyromegaly present.  Cardiovascular: Normal rate, regular rhythm, S1 normal, S2 normal and normal pulses.     No systolic murmur is present   No diastolic murmur is present  Pulses:      Radial pulses are 2+ on the right side, and 2+ on the left side.  Pulmonary/Chest: Effort normal and breath sounds normal.  She has no wheezes. She has no rhonchi. She has no rales. She exhibits no tenderness.  Abdominal: Soft. Normal appearance and bowel sounds are normal. There is no tenderness. There is no CVA tenderness and negative Murphy's sign.  Musculoskeletal:       BLE:s Calves nontender, no cords or erythema, negative Homans sign  Neurological: She is alert and oriented to person, place, and time. She has normal strength. No cranial nerve deficit or sensory deficit. GCS eye subscore is 4. GCS verbal subscore is 5. GCS motor subscore is 6.  Skin: Skin is warm and dry. No rash noted. She is not diaphoretic.  Psychiatric: Her speech is normal.       Cooperative and appropriate    ED Course   Procedures (including critical care time)  Results for orders placed during the hospital encounter of 01/27/12  URINALYSIS, ROUTINE W REFLEX MICROSCOPIC      Component Value Range   Color, Urine AMBER (*) YELLOW   APPearance CLOUDY (*) CLEAR   Specific Gravity, Urine 1.037 (*) 1.005 - 1.030   pH 6.5  5.0 - 8.0   Glucose, UA NEGATIVE  NEGATIVE mg/dL   Hgb urine dipstick LARGE (*) NEGATIVE   Bilirubin Urine NEGATIVE  NEGATIVE   Ketones, ur 15 (*) NEGATIVE mg/dL   Protein, ur 30 (*) NEGATIVE mg/dL   Urobilinogen, UA 1.0  0.0 - 1.0 mg/dL   Nitrite NEGATIVE  NEGATIVE   Leukocytes, UA SMALL (*) NEGATIVE  PREGNANCY, URINE      Component Value Range   Preg Test, Ur NEGATIVE  NEGATIVE  CBC      Component Value Range   WBC 4.2  4.0 - 10.5 K/uL   RBC 4.84  3.87 - 5.11 MIL/uL   Hemoglobin 14.1  12.0 - 15.0 g/dL   HCT 16.1  09.6 - 04.5 %   MCV 88.0  78.0 - 100.0 fL   MCH 29.1  26.0 - 34.0 pg   MCHC 33.1  30.0 - 36.0 g/dL   RDW 40.9  81.1 - 91.4 %   Platelets 255  150 - 400 K/uL  BASIC METABOLIC PANEL      Component Value Range   Sodium 138  135 - 145 mEq/L   Potassium 3.9  3.5 - 5.1 mEq/L   Chloride 102  96 - 112 mEq/L   CO2 26  19 - 32 mEq/L   Glucose, Bld 108 (*) 70 - 99 mg/dL   BUN 12  6 - 23 mg/dL   Creatinine, Ser 7.82  0.50 - 1.10 mg/dL   Calcium 9.3  8.4 - 95.6 mg/dL   GFR calc non Af Amer >90  >90 mL/min   GFR calc Af Amer >90  >90 mL/min  URINE MICROSCOPIC-ADD ON      Component Value Range   Squamous Epithelial / LPF MANY (*) RARE   WBC, UA 3-6  <3 WBC/hpf   RBC / HPF 3-6  <3 RBC/hpf   Bacteria, UA FEW (*) RARE   Urine-Other MUCOUS PRESENT      Labs and UA and urine pregnancy test obtained and reviewed as above. Contaminated urine sample. No UTI symptoms.  Results shared with patient and she is insistent that she is pregnant. I had a Long discussion with her recommended followup with the women's clinic - no indication for further work up at this time and patient  stable for discharge home.   MDM   Vaginal bleeding likely normal menses. Plan close OB/GYN  followup for questionable positive home pregnancy test. No indication for further workup or admission at this time.    LMP a month ago, concerned she may be pregnant. Old records reviewed. Planned followup women's clinic 2 days for recheck    Sunnie Nielsen, MD 01/27/12 [phone]

## 2012-04-29 ENCOUNTER — Encounter (HOSPITAL_COMMUNITY): Payer: Self-pay

## 2012-04-29 ENCOUNTER — Emergency Department (HOSPITAL_COMMUNITY)
Admission: EM | Admit: 2012-04-29 | Discharge: 2012-04-29 | Disposition: A | Discharge status: Home / Self Care | Payer: Medicaid Other | Attending: Emergency Medicine | Admitting: Emergency Medicine

## 2012-04-29 ENCOUNTER — Emergency Department (HOSPITAL_COMMUNITY): Payer: Medicaid Other

## 2012-04-29 DIAGNOSIS — N12 Tubulo-interstitial nephritis, not specified as acute or chronic: Secondary | ICD-10-CM | POA: Insufficient documentation

## 2012-04-29 DIAGNOSIS — F319 Bipolar disorder, unspecified: Secondary | ICD-10-CM | POA: Insufficient documentation

## 2012-04-29 LAB — COMPREHENSIVE METABOLIC PANEL
ALT: 9 U/L (ref 0–35)
AST: 13 U/L (ref 0–37)
Albumin: 3.7 g/dL (ref 3.5–5.2)
Calcium: 9.1 mg/dL (ref 8.4–10.5)
GFR calc Af Amer: >90 mL/min (ref >90)
Glucose, Bld: 95 mg/dL (ref 70–99)
Sodium: 132 mEq/L — ABNORMAL LOW (ref 135–145)
Total Protein: 7.6 g/dL (ref 6.0–8.3)

## 2012-04-29 LAB — PREGNANCY, URINE: Preg Test, Ur: NEGATIVE

## 2012-04-29 LAB — URINALYSIS, ROUTINE W REFLEX MICROSCOPIC
Ketones, ur: 40 mg/dL — AB
Nitrite: POSITIVE — AB
Protein, ur: 100 mg/dL — AB
Urobilinogen, UA: 0.2 mg/dL (ref 0.0–1.0)

## 2012-04-29 LAB — CBC
MCH: 29.6 pg (ref 26.0–34.0)
MCHC: 34.9 g/dL (ref 30.0–36.0)
Platelets: 228 10*3/uL (ref 150–400)
RDW: 13.4 % (ref 11.5–15.5)

## 2012-04-29 LAB — URINE MICROSCOPIC-ADD ON

## 2012-04-29 MED ORDER — FENTANYL CITRATE 0.05 MG/ML IJ SOLN
50.0000 ug | Freq: Once | INTRAMUSCULAR | Status: AC
  Administered 2012-04-29: 50 ug via INTRAVENOUS
  Filled 2012-04-29: qty 2

## 2012-04-29 MED ORDER — CIPROFLOXACIN IN D5W 400 MG/200ML IV SOLN
400.0000 mg | Freq: Once | INTRAVENOUS | Status: AC
  Administered 2012-04-29: 400 mg via INTRAVENOUS
  Filled 2012-04-29: qty 200

## 2012-04-29 MED ORDER — PHENAZOPYRIDINE HCL 200 MG PO TABS: 200.0000 mg | ORAL_TABLET | Freq: Three times a day (TID) | ORAL | Status: DC

## 2012-04-29 MED ORDER — CIPROFLOXACIN HCL 500 MG PO TABS: 500.0000 mg | ORAL_TABLET | Freq: Two times a day (BID) | ORAL | Status: DC

## 2012-04-29 MED ORDER — FENTANYL CITRATE 0.05 MG/ML IJ SOLN
50.0000 ug | Freq: Once | INTRAMUSCULAR | Status: AC
  Administered 2012-04-29: 25 ug via INTRAVENOUS
  Filled 2012-04-29: qty 2

## 2012-04-29 MED ORDER — PHENAZOPYRIDINE HCL 200 MG PO TABS
200.0000 mg | ORAL_TABLET | Freq: Three times a day (TID) | ORAL | Status: DC
  Administered 2012-04-29: 200 mg via ORAL
  Filled 2012-04-29: qty 1

## 2012-04-29 NOTE — ED Notes (Signed)
Per pt, started having abdominal pain, painful urination and hematuria 5 days ago. Also having left flank pain.  Odor to urine noted.

## 2012-04-29 NOTE — ED Provider Notes (Signed)
History     CSN: [id number]  Arrival date & time 04/29/12  4098   First MD Initiated Contact with Patient 04/29/12 [phone]      Chief Complaint  Patient presents with  . Hematuria and abdominal pain     (Consider location/radiation/quality/duration/timing/severity/associated sxs/prior treatment) HPI This is a 21 year old black female with a 5 day history of left flank pain and suprapubic pain. The pain waxes and wanes and is worsening. She describes the pain is severe at present time. It is been associated with nausea but no vomiting. She has had hematuria and pain with urination. She is not aware of having a fever or chills. The pain is worse with palpation or movement. She denies a history of kidney stone or kidney infection. She was given fentanyl on arrival per protocol with transient improvement in her pain.  Past Medical History  Diagnosis Date  . Hepatitis B antibody positive   . Anemia   . Mental disorder     depression and bipolar  . Bipolar 1 disorder     hx cutting   . Chlamydia   . S/P dilation and curettage     for TAB and ectopic    Past Surgical History  Procedure Date  . Liver biopsy   . No past surgeries   . Small intestine surgery     pt had intusseseption at 21 years old  . Dilation and curettage of uterus   . Skin graft     Family History  Problem Relation Age of Onset  . Adopted: Yes    History  Substance Use Topics  . Smoking status: Never Smoker   . Smokeless tobacco: Never Used  . Alcohol Use: No    OB History    Grav Para Term Preterm Abortions TAB SAB Ect Mult Living   4 2 1 1 2 1  0 1 0 2      Review of Systems  All other systems reviewed and are negative.    Allergies  Percocet and Vicodin  Home Medications   Current Outpatient Rx  Name Route Sig Dispense Refill  . ALBUTEROL SULFATE HFA 108 (90 BASE) MCG/ACT IN AERS Inhalation Inhale 1-2 puffs into the lungs every 6 (six) hours as needed. For shortness of breath      BP  111/61  Pulse 75  Temp 99 F (37.2 C) (Oral)  Resp 15  SpO2 98%  LMP 04/16/2012  Breastfeeding? No  Physical Exam General: Well-developed, well-nourished female in no acute distress; appearance consistent with age of record HENT: normocephalic, atraumatic Eyes: Normal appearance Neck: supple Heart: regular rate and rhythm Lungs: clear to auscultation bilaterally Abdomen: soft; nondistended; suprapubic tenderness; bowel sounds present GU: Left flank tenderness Extremities: No deformity; full range of motion; pulses normal Neurologic: Awake, alert and oriented; motor function intact in all extremities and symmetric; no facial droop Skin: Warm and dry Psychiatric: Tearful    ED Course  Procedures (including critical care time)     MDM   Nursing notes and vitals signs, including pulse oximetry, reviewed.  Summary of this visit's results, reviewed by myself:  Labs:  Results for orders placed during the hospital encounter of 04/29/12  URINALYSIS, ROUTINE W REFLEX MICROSCOPIC      Component Value Range   Color, Urine YELLOW  YELLOW   APPearance TURBID (*) CLEAR   Specific Gravity, Urine 1.019  1.005 - 1.030   pH 6.0  5.0 - 8.0   Glucose, UA NEGATIVE  NEGATIVE mg/dL  Hgb urine dipstick LARGE (*) NEGATIVE   Bilirubin Urine NEGATIVE  NEGATIVE   Ketones, ur 40 (*) NEGATIVE mg/dL   Protein, ur 161 (*) NEGATIVE mg/dL   Urobilinogen, UA 0.2  0.0 - 1.0 mg/dL   Nitrite POSITIVE (*) NEGATIVE   Leukocytes, UA LARGE (*) NEGATIVE  PREGNANCY, URINE      Component Value Range   Preg Test, Ur NEGATIVE  NEGATIVE  CBC      Component Value Range   WBC 6.5  4.0 - 10.5 K/uL   RBC 4.52  3.87 - 5.11 MIL/uL   Hemoglobin 13.4  12.0 - 15.0 g/dL   HCT 09.6  04.5 - 40.9 %   MCV 85.0  78.0 - 100.0 fL   MCH 29.6  26.0 - 34.0 pg   MCHC 34.9  30.0 - 36.0 g/dL   RDW 81.1  91.4 - 78.2 %   Platelets 228  150 - 400 K/uL  COMPREHENSIVE METABOLIC PANEL      Component Value Range   Sodium 132  (*) 135 - 145 mEq/L   Potassium 3.6  3.5 - 5.1 mEq/L   Chloride 98  96 - 112 mEq/L   CO2 23  19 - 32 mEq/L   Glucose, Bld 95  70 - 99 mg/dL   BUN 6  6 - 23 mg/dL   Creatinine, Ser 9.56  0.50 - 1.10 mg/dL   Calcium 9.1  8.4 - 21.3 mg/dL   Total Protein 7.6  6.0 - 8.3 g/dL   Albumin 3.7  3.5 - 5.2 g/dL   AST 13  0 - 37 U/L   ALT 9  0 - 35 U/L   Alkaline Phosphatase 67  39 - 117 U/L   Total Bilirubin 0.5  0.3 - 1.2 mg/dL   GFR calc non Af Amer >90  >90 mL/min   GFR calc Af Amer >90  >90 mL/min  URINE MICROSCOPIC-ADD ON      Component Value Range   Squamous Epithelial / LPF RARE  RARE   WBC, UA TOO NUMEROUS TO COUNT  <3 WBC/hpf   RBC / HPF TOO NUMEROUS TO COUNT  <3 RBC/hpf   Bacteria, UA FEW (*) RARE   6:34 AM CT findings consistent with cystitis. No CT evidence of left pyelonephritis but we will treat for pyelonephritis given clinical picture. Patient states she has had some type of surgical intervention for her bowel abnormality but she still only moves her bowels about once a week. There has not been an acute change regarding her bowel function.         Hanley Seamen, MD 04/29/12 [phone]

## 2012-05-01 LAB — URINE CULTURE: Colony Count: >=100000

## 2012-05-02 NOTE — ED Notes (Signed)
+  Urine. Patient treated with Cipro. Sensitive to same. Per protocol MD. °

## 2012-07-04 ENCOUNTER — Emergency Department (HOSPITAL_COMMUNITY): Payer: Medicaid Other

## 2012-07-04 ENCOUNTER — Emergency Department (HOSPITAL_COMMUNITY)
Admission: EM | Admit: 2012-07-04 | Discharge: 2012-07-04 | Disposition: A | Discharge status: Home / Self Care | Payer: Medicaid Other | Attending: Emergency Medicine | Admitting: Emergency Medicine

## 2012-07-04 ENCOUNTER — Encounter (HOSPITAL_COMMUNITY): Payer: Self-pay | Admitting: Emergency Medicine

## 2012-07-04 DIAGNOSIS — F319 Bipolar disorder, unspecified: Secondary | ICD-10-CM | POA: Insufficient documentation

## 2012-07-04 DIAGNOSIS — Z331 Pregnant state, incidental: Secondary | ICD-10-CM | POA: Insufficient documentation

## 2012-07-04 DIAGNOSIS — R109 Unspecified abdominal pain: Secondary | ICD-10-CM | POA: Insufficient documentation

## 2012-07-04 DIAGNOSIS — F329 Major depressive disorder, single episode, unspecified: Secondary | ICD-10-CM | POA: Insufficient documentation

## 2012-07-04 DIAGNOSIS — F3289 Other specified depressive episodes: Secondary | ICD-10-CM | POA: Insufficient documentation

## 2012-07-04 DIAGNOSIS — Z8619 Personal history of other infectious and parasitic diseases: Secondary | ICD-10-CM | POA: Insufficient documentation

## 2012-07-04 LAB — CBC WITH DIFFERENTIAL/PLATELET
Basophils Absolute: 0 10*3/uL (ref 0.0–0.1)
HCT: 35.7 % — ABNORMAL LOW (ref 36.0–46.0)
Lymphocytes Relative: 48 % — ABNORMAL HIGH (ref 12–46)
Neutro Abs: 2.2 10*3/uL (ref 1.7–7.7)
Platelets: 255 10*3/uL (ref 150–400)
RDW: 13.6 % (ref 11.5–15.5)
WBC: 5.3 10*3/uL (ref 4.0–10.5)

## 2012-07-04 LAB — URINALYSIS, ROUTINE W REFLEX MICROSCOPIC
Glucose, UA: NEGATIVE mg/dL
Leukocytes, UA: NEGATIVE
Protein, ur: NEGATIVE mg/dL
Urobilinogen, UA: 1 mg/dL (ref 0.0–1.0)

## 2012-07-04 LAB — WET PREP, GENITAL
Trich, Wet Prep: NONE SEEN
Yeast Wet Prep HPF POC: NONE SEEN

## 2012-07-04 NOTE — ED Provider Notes (Signed)
History     CSN: [id number]  Arrival date & time 07/04/12  0020   First MD Initiated Contact with Patient 07/04/12 [phone]      Chief Complaint  Patient presents with  . Abdominal Cramping  . Possible Pregnancy    (Consider location/radiation/quality/duration/timing/severity/associated sxs/prior treatment) HPI 21 year old female presents to the emergency department with complaint of lower abdominal crampy pain starting about 30 minutes ago. Patient reports LMP November 4, but does remember the last period prior to that. She reports her last menstrual period was not normal but cannot describe why it was not normal. She reports she has irregular menses. Patient has taken home pregnancy tests and they have been positive. She became is concerned with the abdominal cramping due to having a prior ectopic pregnancy. She has not had any vaginal discharge or bleeding. She has had nausea vomiting and tiredness and some breast soreness.  Patient is unsure which side she had the ectopic pregnancy on. She reports she had a procedure that involved sucking things out of her uterus for her ectopic pregnancy.  Past Medical History  Diagnosis Date  . Hepatitis B antibody positive   . Anemia   . Mental disorder     depression and bipolar  . Bipolar 1 disorder     hx cutting   . Chlamydia   . S/P dilation and curettage     for TAB and ectopic    Past Surgical History  Procedure Date  . Liver biopsy   . No past surgeries   . Small intestine surgery     pt had intusseseption at 21 years old  . Dilation and curettage of uterus   . Skin graft     Family History  Problem Relation Age of Onset  . Adopted: Yes    History  Substance Use Topics  . Smoking status: Never Smoker   . Smokeless tobacco: Never Used  . Alcohol Use: No    OB History    Grav Para Term Preterm Abortions TAB SAB Ect Mult Living   4 2 1 1 2 1  0 1 0 2      Review of Systems  See History of Present Illness; otherwise  all other systems are reviewed and negative  Allergies  Percocet and Vicodin  Home Medications   Current Outpatient Rx  Name  Route  Sig  Dispense  Refill  . ALBUTEROL SULFATE HFA 108 (90 BASE) MCG/ACT IN AERS   Inhalation   Inhale 1-2 puffs into the lungs every 6 (six) hours as needed. For shortness of breath           BP 128/73  Pulse 79  Temp 98.7 F (37.1 C) (Oral)  Resp 18  Ht 5\' 3"  (1.6 m)  Wt 201 lb 6.4 oz (91.354 kg)  BMI 35.68 kg/m2  SpO2 97%  Breastfeeding? No  Physical Exam  Nursing note and vitals reviewed. Constitutional: She is oriented to person, place, and time. She appears well-developed and well-nourished.  HENT:  Head: Normocephalic and atraumatic.  Nose: Nose normal.  Mouth/Throat: Oropharynx is clear and moist.  Eyes: Conjunctivae normal and EOM are normal. Pupils are equal, round, and reactive to light.  Neck: Normal range of motion. Neck supple. No JVD present. No tracheal deviation present. No thyromegaly present.  Cardiovascular: Normal rate, regular rhythm, normal heart sounds and intact distal pulses.  Exam reveals no gallop and no friction rub.   No murmur heard. Pulmonary/Chest: Effort normal and breath sounds normal.  No stridor. No respiratory distress. She has no wheezes. She has no rales. She exhibits no tenderness.  Abdominal: Soft. Bowel sounds are normal. She exhibits no distension and no mass. There is tenderness (left lower quadrant tenderness with palpation). There is no rebound and no guarding.  Musculoskeletal: Normal range of motion. She exhibits no edema and no tenderness.  Lymphadenopathy:    She has no cervical adenopathy.  Neurological: She is alert and oriented to person, place, and time. No cranial nerve deficit. She exhibits normal muscle tone. Coordination normal.  Skin: Skin is dry. No rash noted. No erythema. No pallor.  Psychiatric: She has a normal mood and affect. Her behavior is normal. Judgment and thought content  normal.    ED Course  Procedures (including critical care time)  Labs Reviewed  HCG, QUANTITATIVE, PREGNANCY - Abnormal; Notable for the following:    hCG, Beta Chain, Quant, S 118 (*)     All other components within normal limits  PREGNANCY, URINE - Abnormal; Notable for the following:    Preg Test, Ur POSITIVE (*)     All other components within normal limits  CBC WITH DIFFERENTIAL - Abnormal; Notable for the following:    HCT 35.7 (*)     Neutrophils Relative 42 (*)     Lymphocytes Relative 48 (*)     All other components within normal limits  URINALYSIS, ROUTINE W REFLEX MICROSCOPIC   US Ob Comp Less 14 Wks  07/04/2012  *RADIOLOGY REPORT*  Clinical Data: Patient pregnant; history of ectopic pregnancy.  OBSTETRIC <14 WK [place] AND TRANSVAGINAL OB US  Technique:  Both transabdominal and transvaginal ultrasound examinations were performed for complete evaluation of the gestation as well as the maternal uterus, adnexal regions, and pelvic cul-de-sac.  Transvaginal technique was performed to assess early pregnancy.  Comparison:  Pelvic ultrasound performed 09/29/2011  Intrauterine gestational sac:  None seen  Maternal uterus/adnexae: The uterus is grossly unremarkable in appearance.  The endometrial echo complex measures 0.9 cm in thickness.  The ovaries are within normal limits.  The right ovary measures 3.0 x 1.6 x 2.4 cm, while the left ovary measures 3.0 x 2.7 x 1.6 cm.  No free fluid is seen within the pelvic cul-de-sac.  IMPRESSION: No intrauterine gestational sac is yet seen.  This is to be expected, given the quantitative beta HCG level of 118.  It is too early to visualize the pregnancy at this time.  Given the patient's history of ectopic pregnancy, a follow-up pelvic ultrasound could be performed in 2 weeks.   Original Report Authenticated By: Tonia Ghent, M.D.    US Ob Transvaginal  07/04/2012  *RADIOLOGY REPORT*  Clinical Data: Patient pregnant; history of ectopic pregnancy.   OBSTETRIC <14 WK [place] AND TRANSVAGINAL OB US  Technique:  Both transabdominal and transvaginal ultrasound examinations were performed for complete evaluation of the gestation as well as the maternal uterus, adnexal regions, and pelvic cul-de-sac.  Transvaginal technique was performed to assess early pregnancy.  Comparison:  Pelvic ultrasound performed 09/29/2011  Intrauterine gestational sac:  None seen  Maternal uterus/adnexae: The uterus is grossly unremarkable in appearance.  The endometrial echo complex measures 0.9 cm in thickness.  The ovaries are within normal limits.  The right ovary measures 3.0 x 1.6 x 2.4 cm, while the left ovary measures 3.0 x 2.7 x 1.6 cm.  No free fluid is seen within the pelvic cul-de-sac.  IMPRESSION: No intrauterine gestational sac is yet seen.  This is to be  expected, given the quantitative beta HCG level of 118.  It is too early to visualize the pregnancy at this time.  Given the patient's history of ectopic pregnancy, a follow-up pelvic ultrasound could be performed in 2 weeks.   Original Report Authenticated By: Tonia Ghent, M.D.      1. Abdominal pain in pregnancy       MDM  21 year old female with home positive pregnancy test with LMP of 11/4 which gives her a gestational age of [redacted] weeks and 6 days. She reports that this last menstrual period was abnormal, so I am concern that she is actually further along than estimated from this date. Her beta hCG is 118 which is consistent from 2 weeks to 4 weeks. We'll get transvaginal ultrasound to assure that she does not have an ectopic pregnancy.  Patient's ultrasound does not show an IUP or ectopic pregnancy. Discuss case with on-call midwife at [facility] who is just that patient followup at the [facility] hospital in 2 days for repeat Quant. Patient updated on current plan and need to have further care at the [facility] hospital in 2 days. He should reports pain has resolved during her stay in the emergency  department.        Olivia Mackie, MD 07/04/12 [phone]

## 2012-07-04 NOTE — ED Notes (Addendum)
Pt states that she is having lower mid abdominal pain that radiates to her back. Pt took home pregnancy test today, which showed positive result. LMP was 06/07/12. Pt has hx of ectopic pregnancy. Pt c/o N/V. Pt denies vaginal discharge, bleeding, or diarrhea.

## 2012-07-04 NOTE — ED Notes (Signed)
Pt states that she had her period from Nov 4th-Nov8th but started having n/v, tiredness and took a pregnancy test and it was positive. Began having lower abdominal cramping today approx. 30 mins before she came in. Hx of ectopic pregnancy in past.

## 2012-07-05 LAB — GC/CHLAMYDIA PROBE AMP
CT Probe RNA: NEGATIVE
GC Probe RNA: NEGATIVE

## 2012-07-08 ENCOUNTER — Inpatient Hospital Stay (HOSPITAL_COMMUNITY)
Admission: AD | Admit: 2012-07-08 | Discharge: 2012-07-08 | Disposition: A | Discharge status: Home / Self Care | Payer: Medicaid Other | Source: Ambulatory Visit | Attending: Obstetrics & Gynecology | Admitting: Obstetrics & Gynecology

## 2012-07-08 ENCOUNTER — Encounter (HOSPITAL_COMMUNITY): Payer: Self-pay | Admitting: *Deleted

## 2012-07-08 ENCOUNTER — Inpatient Hospital Stay (HOSPITAL_COMMUNITY): Payer: Medicaid Other

## 2012-07-08 DIAGNOSIS — O26899 Other specified pregnancy related conditions, unspecified trimester: Secondary | ICD-10-CM

## 2012-07-08 DIAGNOSIS — R109 Unspecified abdominal pain: Secondary | ICD-10-CM

## 2012-07-08 DIAGNOSIS — O99891 Other specified diseases and conditions complicating pregnancy: Secondary | ICD-10-CM | POA: Insufficient documentation

## 2012-07-08 HISTORY — DX: Unspecified asthma, uncomplicated: J45.909

## 2012-07-08 HISTORY — DX: Other specified postprocedural states: Z98.890

## 2012-07-08 HISTORY — DX: Other specified postprocedural states: R11.2

## 2012-07-08 LAB — URINALYSIS, ROUTINE W REFLEX MICROSCOPIC
Glucose, UA: NEGATIVE mg/dL
Hgb urine dipstick: NEGATIVE
Ketones, ur: NEGATIVE mg/dL
Protein, ur: NEGATIVE mg/dL

## 2012-07-08 LAB — CBC
MCHC: 33.4 g/dL (ref 30.0–36.0)
Platelets: 241 10*3/uL (ref 150–400)
RDW: 14 % (ref 11.5–15.5)
WBC: 4.6 10*3/uL (ref 4.0–10.5)

## 2012-07-08 LAB — ABO/RH: ABO/RH(D): A POS

## 2012-07-08 LAB — URINE MICROSCOPIC-ADD ON

## 2012-07-08 LAB — HCG, QUANTITATIVE, PREGNANCY: hCG, Beta Chain, Quant, S: 1544 m[IU]/mL — ABNORMAL HIGH (ref <5)

## 2012-07-08 MED ORDER — PROMETHAZINE HCL 25 MG PO TABS: 12.5000 mg | ORAL_TABLET | Freq: Four times a day (QID) | ORAL | Status: DC | PRN

## 2012-07-08 NOTE — MAU Provider Note (Signed)
History     CSN: [id number]  Arrival date and time: 07/08/12 2007   None     Chief Complaint  Patient presents with  . Back Pain   HPI YESLIN Moss is a 21 y.o. female who presents to MAU for pain in early pregnancy. The pain started 5 days ago. She went to [facility] ED 4 days ago and had labs and ultrasound. Bhcg was 117 and ultrasound showed no IUP. Patient instructed to follow up in MAU for lab and ultrasound. She rates her pain as 5/10. The pain is located in the lower abdomen and radiates to the lower back. Last pap smear ore than one year ago and was normal. Hx of Chlamydia. Last sexual intercourse one week ago. Cultures for GC and Chlamydia done at [facility] were negative.  The history was provided by the patient. OB History    Grav Para Term Preterm Abortions TAB SAB Ect Mult Living   5 2 1 1 2 1  0 1 0 2      Past Medical History  Diagnosis Date  . Hepatitis B antibody positive   . Anemia   . Mental disorder     depression and bipolar  . Bipolar 1 disorder     hx cutting   . Chlamydia   . S/P dilation and curettage     for TAB and ectopic  . PONV (postoperative nausea and vomiting)   . Asthma     Past Surgical History  Procedure Date  . Liver biopsy   . No past surgeries   . Small intestine surgery     pt had intusseseption at 21 years old  . Dilation and curettage of uterus   . Skin graft     Family History  Problem Relation Age of Onset  . Adopted: Yes    History  Substance Use Topics  . Smoking status: Current Every Day Smoker  . Smokeless tobacco: Never Used  . Alcohol Use: No    Allergies:  Allergies  Allergen Reactions  . Percocet (Oxycodone-Acetaminophen) Anaphylaxis  . Vicodin (Hydrocodone-Acetaminophen) Anaphylaxis    Prescriptions prior to admission  Medication Sig Dispense Refill  . Prenatal Vit-Fe Fumarate-FA (MULTIVITAMIN-PRENATAL) 27-0.8 MG TABS Take 1 tablet by mouth daily.      . [DISCONTINUED] albuterol (PROVENTIL  HFA;VENTOLIN HFA) 108 (90 BASE) MCG/ACT inhaler Inhale 1-2 puffs into the lungs every 6 (six) hours as needed. For shortness of breath        Review of Systems  Constitutional: Negative for fever, chills and weight loss.  HENT: Negative for ear pain, nosebleeds, congestion, sore throat and neck pain.   Eyes: Negative for blurred vision, double vision, photophobia and pain.  Respiratory: Negative for cough, shortness of breath and wheezing.   Cardiovascular: Negative for chest pain, palpitations and leg swelling.  Gastrointestinal: Positive for heartburn, nausea, vomiting, abdominal pain and constipation. Negative for diarrhea.  Genitourinary: Negative for dysuria, urgency and frequency.  Musculoskeletal: Positive for back pain. Negative for myalgias.  Skin: Negative for itching and rash.  Neurological: Positive for dizziness and headaches. Negative for sensory change, speech change, seizures and weakness.  Endo/Heme/Allergies: Does not bruise/bleed easily.  Psychiatric/Behavioral: Positive for depression (bipolar, not on any medication now). The patient has insomnia. The patient is not nervous/anxious.    Physical Exam   Blood pressure 126/54, pulse 75, temperature 98.2 F (36.8 C), temperature source Oral, resp. rate 16, height 5\' 3"  (1.6 m), weight 203 lb (92.08 kg), last menstrual  period 06/07/2012, SpO2 99.00%, unknown if currently breastfeeding.  Physical Exam  Nursing note and vitals reviewed. Constitutional: She is oriented to person, place, and time. She appears well-developed and well-nourished. No distress.  HENT:  Head: Normocephalic and atraumatic.  Eyes: EOM are normal.  Neck: Neck supple.  Cardiovascular: Normal rate.   Respiratory: Effort normal.  GI: Soft. There is tenderness in the left lower quadrant. There is no rigidity, no rebound, no guarding and no CVA tenderness.       Tenderness is mild  Musculoskeletal: Normal range of motion.  Neurological: She is alert  and oriented to person, place, and time.  Skin: Skin is warm and dry.  Psychiatric: She has a normal mood and affect. Her behavior is normal. Judgment and thought content normal.   Results for orders placed during the hospital encounter of 07/08/12 (from the past 24 hour(s))  URINALYSIS, ROUTINE W REFLEX MICROSCOPIC     Status: Abnormal   Collection Time   07/08/12  8:32 PM      Component Value Range   Color, Urine YELLOW  YELLOW   APPearance HAZY (*) CLEAR   Specific Gravity, Urine 1.025  1.005 - 1.030   pH 6.0  5.0 - 8.0   Glucose, UA NEGATIVE  NEGATIVE mg/dL   Hgb urine dipstick NEGATIVE  NEGATIVE   Bilirubin Urine NEGATIVE  NEGATIVE   Ketones, ur NEGATIVE  NEGATIVE mg/dL   Protein, ur NEGATIVE  NEGATIVE mg/dL   Urobilinogen, UA 0.2  0.0 - 1.0 mg/dL   Nitrite NEGATIVE  NEGATIVE   Leukocytes, UA TRACE (*) NEGATIVE  URINE MICROSCOPIC-ADD ON     Status: Abnormal   Collection Time   07/08/12  8:32 PM      Component Value Range   Squamous Epithelial / LPF MANY (*) RARE   WBC, UA 3-6  <3 WBC/hpf   Bacteria, UA FEW (*) RARE   Urine-Other MUCOUS PRESENT    CBC     Status: Abnormal   Collection Time   07/08/12  9:20 PM      Component Value Range   WBC 4.6  4.0 - 10.5 K/uL   RBC 4.13  3.87 - 5.11 MIL/uL   Hemoglobin 12.0  12.0 - 15.0 g/dL   HCT 16.1 (*) 09.6 - 04.5 %   MCV 86.9  78.0 - 100.0 fL   MCH 29.1  26.0 - 34.0 pg   MCHC 33.4  30.0 - 36.0 g/dL   RDW 40.9  81.1 - 91.4 %   Platelets 241  150 - 400 K/uL  ABO/RH     Status: Normal   Collection Time   07/08/12  9:20 PM      Component Value Range   ABO/RH(D) A POS    HCG, QUANTITATIVE, PREGNANCY     Status: Abnormal   Collection Time   07/08/12  9:20 PM      Component Value Range   hCG, Beta Chain, Quant, S 1544 (*) <5 mIU/mL   US Ob Transvaginal  07/08/2012  *RADIOLOGY REPORT*  Clinical Data: Early pregnancy.  Previous ectopic pregnancy.  No IUP visualized on prior exam.  TRANSVAGINAL OBSTETRIC US  Technique:  Transvaginal  ultrasound was performed for complete evaluation of the gestation as well as the maternal uterus, adnexal regions, and pelvic cul-de-sac.  Comparison:  07/04/2012  Intrauterine gestational sac: Probable single Yolk sac: None visualized Embryo: None visualized  MSD: 3  mm  4    w 6    d  Subchorionic hemorrhage: Moderate subchorionic hemorrhage is visualized.  Maternal uterus/adnexae: Left ovarian corpus luteum cyst noted.  Normal right ovary.  No adnexal mass or free fluid identified.  IMPRESSION:  1.  Probable early 5-week intrauterine gestational sac, with moderate subchorionic hemorrhage noted. 2.  No adnexal mass or free fluid identified.   Original Report Authenticated By: Myles Rosenthal, M.D.     Assessment: 21 y.o. female @ 4 weeks 6 days gestation with abdominal pain  Diff Dx: Early IUP   Ectopic pregnancy  Plan:  Repeat ultrasound in 10 days return sooner for problems.  Discussed with the patient and all questioned fully answered. She will return if any problems arise.   Medication List     As of 07/12/2012 12:32 AM    START taking these medications         promethazine 25 MG tablet   Commonly known as: PHENERGAN   Take 0.5 tablets (12.5 mg total) by mouth every 6 (six) hours as needed for nausea.      CONTINUE taking these medications         multivitamin-prenatal 27-0.8 MG Tabs      STOP taking these medications         albuterol 108 (90 BASE) MCG/ACT inhaler   Commonly known as: PROVENTIL HFA;VENTOLIN HFA          Where to get your medications    These are the prescriptions that you need to pick up. We sent them to a specific pharmacy, so you will need to go there to get them.   [id number] - [place], [place] - [address] AT [facility] [address]    [address] [place] [address]    Phone: [phone]        promethazine 25 MG tablet           Procedures Zanden Colver, RN, FNP, [facility] 07/08/2012, 9:46 PM

## 2012-07-08 NOTE — MAU Note (Signed)
Pt states she was told to come here by [facility] for proof of pregnancy and to make sure it is not an ectopic. Pt states she went to [facility] for cramping 2 days ago and had positive pregnancy test. Denies bleeding, reports pain in her lower back and "mild cramping"

## 2012-07-10 LAB — URINE CULTURE: Colony Count: 50000

## 2012-07-19 ENCOUNTER — Ambulatory Visit (HOSPITAL_COMMUNITY): Payer: Medicaid Other | Attending: Nurse Practitioner

## 2012-08-04 NOTE — L&D Delivery Note (Signed)
Delivery Note On hands and knees position, pt pushed well and at 1:04 AM a viable female was delivered via Vaginal, Spontaneous Delivery (Presentation: ; Occiput Anterior).  APGAR: 9, 9; weight 7 lb (3175 g).  Infant dried, then cord clamped and cut by CNM. Pt turned to semi fowlers and infant to warmer. Placenta status: Intact, Spontaneous.  Cord: 3 vessels with the following complications: True Knot.    Anesthesia: None  Episiotomy: None Lacerations: None Est. Blood Loss (mL): 350  Mom to postpartum.  Baby to nursery-stable.  Narayan Scull 03/11/2013, 2:09 AM

## 2012-08-08 ENCOUNTER — Encounter (HOSPITAL_COMMUNITY): Payer: Self-pay | Admitting: *Deleted

## 2012-08-08 ENCOUNTER — Inpatient Hospital Stay (HOSPITAL_COMMUNITY): Payer: Medicaid Other

## 2012-08-08 ENCOUNTER — Inpatient Hospital Stay (HOSPITAL_COMMUNITY)
Admission: AD | Admit: 2012-08-08 | Discharge: 2012-08-08 | Disposition: A | Discharge status: Hospice | Payer: Medicaid Other | Source: Ambulatory Visit | Attending: Family Medicine | Admitting: Family Medicine

## 2012-08-08 DIAGNOSIS — R1012 Left upper quadrant pain: Secondary | ICD-10-CM | POA: Insufficient documentation

## 2012-08-08 DIAGNOSIS — S3981XA Other specified injuries of abdomen, initial encounter: Secondary | ICD-10-CM

## 2012-08-08 DIAGNOSIS — R10812 Left upper quadrant abdominal tenderness: Secondary | ICD-10-CM

## 2012-08-08 DIAGNOSIS — O99891 Other specified diseases and conditions complicating pregnancy: Secondary | ICD-10-CM | POA: Insufficient documentation

## 2012-08-08 DIAGNOSIS — S3991XA Unspecified injury of abdomen, initial encounter: Secondary | ICD-10-CM
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
## 2012-08-09 NOTE — MAU Provider Note (Signed)
Chart reviewed and agree with management and plan.  

## 2012-09-06 ENCOUNTER — Encounter: Payer: Medicaid Other | Admitting: Obstetrics & Gynecology

## 2012-09-07 ENCOUNTER — Encounter (HOSPITAL_COMMUNITY): Payer: Self-pay | Admitting: *Deleted

## 2012-09-07 ENCOUNTER — Inpatient Hospital Stay (HOSPITAL_COMMUNITY)
Admission: AD | Admit: 2012-09-07 | Discharge: 2012-09-08 | Disposition: A | Discharge status: Home / Self Care | Payer: Medicaid Other | Source: Ambulatory Visit | Attending: Family Medicine | Admitting: Family Medicine

## 2012-09-07 DIAGNOSIS — O219 Vomiting of pregnancy, unspecified: Secondary | ICD-10-CM

## 2012-09-07 DIAGNOSIS — A499 Bacterial infection, unspecified: Secondary | ICD-10-CM | POA: Insufficient documentation

## 2012-09-07 DIAGNOSIS — R109 Unspecified abdominal pain: Secondary | ICD-10-CM | POA: Insufficient documentation

## 2012-09-07 DIAGNOSIS — N760 Acute vaginitis: Secondary | ICD-10-CM | POA: Insufficient documentation

## 2012-09-07 DIAGNOSIS — R300 Dysuria: Secondary | ICD-10-CM

## 2012-09-07 DIAGNOSIS — Z87898 Personal history of other specified conditions: Secondary | ICD-10-CM

## 2012-09-07 DIAGNOSIS — O2390 Unspecified genitourinary tract infection in pregnancy, unspecified trimester: Secondary | ICD-10-CM | POA: Insufficient documentation

## 2012-09-07 DIAGNOSIS — O210 Mild hyperemesis gravidarum: Secondary | ICD-10-CM | POA: Insufficient documentation

## 2012-09-07 DIAGNOSIS — B9689 Other specified bacterial agents as the cause of diseases classified elsewhere: Secondary | ICD-10-CM | POA: Insufficient documentation

## 2012-09-07 LAB — URINALYSIS, ROUTINE W REFLEX MICROSCOPIC
Bilirubin Urine: NEGATIVE
Ketones, ur: 15 mg/dL — AB
Leukocytes, UA: NEGATIVE
Nitrite: NEGATIVE
Protein, ur: NEGATIVE mg/dL
pH: 7 (ref 5.0–8.0)

## 2012-09-07 NOTE — MAU Note (Signed)
Pt states she started having pain in her abdomen last week Thursday 09/02/12. Pt states pain is a sharp pain on both sides of her lower abdomen

## 2012-09-07 NOTE — MAU Note (Signed)
Pt reports burning with urination, cramping. States she "fainted" last week. States her hands and feet have been swelling and tingling.
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
## 2012-09-07 NOTE — MAU Provider Note (Signed)
Chief Complaint: Abdominal Pain  First Provider Initiated Contact with Patient 09/07/12 2329     SUBJECTIVE HPI: Lauren Moss is a 22 y.o. [id number] at [redacted]w[redacted]d by LMP who presents with:  1. Burning with urination. 2. Nausea and vomiting since beginning of pregnancy. Vomiting twice a day. Not able to eat well. Taking phenergan QAM, not helping.  3. Passed out one last week. No Hx similar episodes. No injury.  4. Very concerned about swelling and tingling of hands and feet after eating potato chips.  5. Vaginal discharge w/ odor. 6. Mild low abd cramping.   Missed NOB appt at [facility] 09/06/12.    Past Medical History  Diagnosis Date  . Hepatitis B antibody positive   . Anemia   . Mental disorder     depression and bipolar  . Bipolar 1 disorder     hx cutting   . Chlamydia   . S/P dilation and curettage     for TAB and ectopic  . Asthma   . PONV (postoperative nausea and vomiting)    OB History    Grav Para Term Preterm Abortions TAB SAB Ect Mult Living   5 2 1 1 2 1  0 1 0 2     # Outc Date GA Lbr Len/2nd Wgt Sex Del Anes PTL Lv   1 ECT 2009 [redacted]w[redacted]d          Comments: right tube   2 TAB 2009 [redacted]w[redacted]d          3 TRM 10/12 [redacted]w[redacted]d 08:53 / 00:32 6lb13.7oz(3.11kg) M SVD EPI  Yes   4 PRE  [redacted]w[redacted]d  6lb14.4oz(3.13kg) M SVD EPI Yes    5 CUR              Past Surgical History  Procedure Date  . Liver biopsy   . No past surgeries   . Small intestine surgery     pt had intusseseption at 22 years old  . Dilation and curettage of uterus   . Skin graft    History   Social History  . Marital Status: Single    Spouse Name: N/A    Number of Children: N/A  . Years of Education: N/A   Occupational History  . Not on file.   Social History Main Topics  . Smoking status: Former Smoker    Quit date: 07/08/2012  . Smokeless tobacco: Never Used  . Alcohol Use: No  . Drug Use: No  . Sexually Active: Yes    Birth Control/ Protection: None   Other Topics Concern  . Not on file   Social  History Narrative  . No narrative on file   No current facility-administered medications on file prior to encounter.   Current Outpatient Prescriptions on File Prior to Encounter  Medication Sig Dispense Refill  . Prenatal Vit-Fe Fumarate-FA (MULTIVITAMIN-PRENATAL) 27-0.8 MG TABS Take 1 tablet by mouth daily.      . promethazine (PHENERGAN) 25 MG tablet Take 0.5 tablets (12.5 mg total) by mouth every 6 (six) hours as needed for nausea.  20 tablet  0   Allergies  Allergen Reactions  . Percocet (Oxycodone-Acetaminophen) Anaphylaxis  . Vicodin (Hydrocodone-Acetaminophen) Anaphylaxis    ROS: Denies fever, chills, frequency of urination, hematuria, urgency, SOB, palpitations, swelling calves, VB. No   OBJECTIVE Blood pressure 123/53, pulse 76, temperature 98 F (36.7 C), temperature source Oral, resp. rate 18, height 5\' 3"  (1.6 m), weight 91.627 kg (202 lb), last menstrual period 06/07/2012, SpO2 100.00%, unknown  if currently breastfeeding. GENERAL: Well-developed, well-nourished female in no acute distress.  HEENT: Normocephalic HEART: normal rate RESP: normal effort ABDOMEN: Soft, non-tender EXTREMITIES: Nontender, no edema. Normal sensation bilat. NEURO: Alert and oriented SPECULUM EXAM: NEFG, moderate amount of creamy, white, malodorous discharge, no blood noted, cervix clean BIMANUAL: cervix long and closed; uterus 13-14 size, no adnexal tenderness or masses FHR 158 by doppler.   LAB RESULTS Results for orders placed during the hospital encounter of 09/07/12 (from the past 24 hour(s))  URINALYSIS, ROUTINE W REFLEX MICROSCOPIC     Status: Abnormal   Collection Time   09/07/12 10:56 PM      Component Value Range   Color, Urine YELLOW  YELLOW   APPearance CLEAR  CLEAR   Specific Gravity, Urine 1.015  1.005 - 1.030   pH 7.0  5.0 - 8.0   Glucose, UA NEGATIVE  NEGATIVE mg/dL   Hgb urine dipstick NEGATIVE  NEGATIVE   Bilirubin Urine NEGATIVE  NEGATIVE   Ketones, ur 15 (*) NEGATIVE  mg/dL   Protein, ur NEGATIVE  NEGATIVE mg/dL   Urobilinogen, UA 1.0  0.0 - 1.0 mg/dL   Nitrite NEGATIVE  NEGATIVE   Leukocytes, UA NEGATIVE  NEGATIVE  WET PREP, GENITAL     Status: Abnormal   Collection Time   09/07/12 11:50 PM      Component Value Range   Yeast Wet Prep HPF POC NONE SEEN  NONE SEEN   Trich, Wet Prep NONE SEEN  NONE SEEN   Clue Cells Wet Prep HPF POC FEW (*) NONE SEEN   WBC, Wet Prep HPF POC MODERATE (*) NONE SEEN  CBC     Status: Abnormal   Collection Time   09/08/12 12:43 AM      Component Value Range   WBC 5.2  4.0 - 10.5 K/uL   RBC 4.10  3.87 - 5.11 MIL/uL   Hemoglobin 12.0  12.0 - 15.0 g/dL   HCT 16.1 (*) 09.6 - 04.5 %   MCV 87.3  78.0 - 100.0 fL   MCH 29.3  26.0 - 34.0 pg   MCHC 33.5  30.0 - 36.0 g/dL   RDW 40.9  81.1 - 91.4 %   Platelets 205  150 - 400 K/uL    IMAGING No results found.  MAU COURSE Lengthy conversation of possibly causes of passing out. Likely related to poor PO intake leading to dehydration and hypoglycemia. Zofran given. Push PO fluids. Check Hgb.   ASSESSMENT 1. BV (bacterial vaginosis)   2. Dysuria in pregnancy   3. History of syncope   4. Nausea and vomiting of pregnancy, antepartum     PLAN Discharge home. Increase fluids. Small, frequent meals.  Use caution when rising from lying down or sitting.  Decrease sodium intake.      Follow-up Information    Schedule an appointment as soon as possible for a visit with [facility].   Contact information:   [address] [place] [address] [phone]          Medication List     As of 09/08/2012  1:17 AM    TAKE these medications         metroNIDAZOLE 500 MG tablet   Commonly known as: FLAGYL   Take 1 tablet (500 mg total) by mouth 2 (two) times daily.      multivitamin-prenatal 27-0.8 MG Tabs   Take 1 tablet by mouth daily.      ondansetron 4 MG tablet  Commonly known as: ZOFRAN   Take 1 tablet (4 mg total) by mouth every 8  (eight) hours as needed for nausea.      promethazine 25 MG tablet   Commonly known as: PHENERGAN   Take 0.5 tablets (12.5 mg total) by mouth every 6 (six) hours as needed for nausea.         Merritt, CNM 09/08/2012  1:17 AM

## 2012-09-07 NOTE — Progress Notes (Signed)
Pt states she is nauseous all day but vomited

## 2012-09-07 NOTE — Progress Notes (Signed)
Pt states she took medication for depression and doesn't remember when

## 2012-09-08 DIAGNOSIS — A499 Bacterial infection, unspecified: Secondary | ICD-10-CM

## 2012-09-08 DIAGNOSIS — N760 Acute vaginitis: Secondary | ICD-10-CM

## 2012-09-08 LAB — CBC
HCT: 35.8 % — ABNORMAL LOW (ref 36.0–46.0)
MCV: 87.3 fL (ref 78.0–100.0)
Platelets: 205 10*3/uL (ref 150–400)
RBC: 4.1 MIL/uL (ref 3.87–5.11)
RDW: 13 % (ref 11.5–15.5)
WBC: 5.2 10*3/uL (ref 4.0–10.5)

## 2012-09-08 LAB — GC/CHLAMYDIA PROBE AMP: GC Probe RNA: NEGATIVE

## 2012-09-08 LAB — WET PREP, GENITAL

## 2012-09-08 MED ORDER — METRONIDAZOLE 500 MG PO TABS: 500.0000 mg | ORAL_TABLET | Freq: Two times a day (BID) | ORAL | Status: DC

## 2012-09-08 MED ORDER — ONDANSETRON HCL 4 MG PO TABS: 4.0000 mg | ORAL_TABLET | Freq: Three times a day (TID) | ORAL | Status: DC | PRN

## 2012-09-08 MED ORDER — ONDANSETRON HCL 4 MG PO TABS
4.0000 mg | ORAL_TABLET | Freq: Once | ORAL | Status: AC
  Administered 2012-09-08: 4 mg via ORAL
  Filled 2012-09-08 (×2): qty 1

## 2012-09-08 NOTE — MAU Provider Note (Signed)
Chart reviewed and agree with management and plan.  

## 2012-10-07 ENCOUNTER — Ambulatory Visit (INDEPENDENT_AMBULATORY_CARE_PROVIDER_SITE_OTHER): Payer: Self-pay | Admitting: Advanced Practice Midwife

## 2012-10-07 ENCOUNTER — Encounter: Payer: Self-pay | Admitting: Advanced Practice Midwife

## 2012-10-07 DIAGNOSIS — O98519 Other viral diseases complicating pregnancy, unspecified trimester: Secondary | ICD-10-CM

## 2012-10-07 LAB — POCT URINALYSIS DIP (DEVICE)
Bilirubin Urine: NEGATIVE
Ketones, ur: NEGATIVE mg/dL
Leukocytes, UA: NEGATIVE
Specific Gravity, Urine: >=1.03 (ref 1.005–1.030)

## 2012-10-07 MED ORDER — CONCEPT OB 130-92.4-1 MG PO CAPS: 1.0000 | ORAL_CAPSULE | Freq: Every day | ORAL | Status: DC

## 2012-10-07 NOTE — Patient Instructions (Addendum)
Pregnancy - Second Trimester The second trimester of pregnancy (3 to 6 months) is a period of rapid growth for you and your baby. At the end of the sixth month, your baby is about 9 inches long and weighs 1 1/2 pounds. You will begin to feel the baby move between 18 and 20 weeks of the pregnancy. This is called quickening. Weight gain is faster. A clear fluid (colostrum) may leak out of your breasts. You may feel small contractions of the womb (uterus). This is known as false labor or Braxton-Hicks contractions. This is like a practice for labor when the baby is ready to be born. Usually, the problems with morning sickness have usually passed by the end of your first trimester. Some women develop small dark blotches (called cholasma, mask of pregnancy) on their face that usually goes away after the baby is born. Exposure to the sun makes the blotches worse. Acne may also develop in some pregnant women and pregnant women who have acne, may find that it goes away. PRENATAL EXAMS  Blood work may continue to be done during prenatal exams. These tests are done to check on your health and the probable health of your baby. Blood work is used to follow your blood levels (hemoglobin). Anemia (low hemoglobin) is common during pregnancy. Iron and vitamins are given to help prevent this. You will also be checked for diabetes between 24 and 28 weeks of the pregnancy. Some of the previous blood tests may be repeated.  The size of the uterus is measured during each visit. This is to make sure that the baby is continuing to grow properly according to the dates of the pregnancy.  Your blood pressure is checked every prenatal visit. This is to make sure you are not getting toxemia.  Your urine is checked to make sure you do not have an infection, diabetes or protein in the urine.  Your weight is checked often to make sure gains are happening at the suggested rate. This is to ensure that both you and your baby are growing  normally.  Sometimes, an ultrasound is performed to confirm the proper growth and development of the baby. This is a test which bounces harmless sound waves off the baby so your caregiver can more accurately determine due dates. Sometimes, a specialized test is done on the amniotic fluid surrounding the baby. This test is called an amniocentesis. The amniotic fluid is obtained by sticking a needle into the belly (abdomen). This is done to check the chromosomes in instances where there is a concern about possible genetic problems with the baby. It is also sometimes done near the end of pregnancy if an early delivery is required. In this case, it is done to help make sure the baby's lungs are mature enough for the baby to live outside of the womb. CHANGES OCCURING IN THE SECOND TRIMESTER OF PREGNANCY Your body goes through many changes during pregnancy. They vary from person to person. Talk to your caregiver about changes you notice that you are concerned about.  During the second trimester, you will likely have an increase in your appetite. It is normal to have cravings for certain foods. This varies from person to person and pregnancy to pregnancy.  Your lower abdomen will begin to bulge.  You may have to urinate more often because the uterus and baby are pressing on your bladder. It is also common to get more bladder infections during pregnancy (pain with urination). You can help this by   drinking lots of fluids and emptying your bladder before and after intercourse.  You may begin to get stretch marks on your hips, abdomen, and breasts. These are normal changes in the body during pregnancy. There are no exercises or medications to take that prevent this change.  You may begin to develop swollen and bulging veins (varicose veins) in your legs. Wearing support hose, elevating your feet for 15 minutes, 3 to 4 times a day and limiting salt in your diet helps lessen the problem.  Heartburn may develop  as the uterus grows and pushes up against the stomach. Antacids recommended by your caregiver helps with this problem. Also, eating smaller meals 4 to 5 times a day helps.  Constipation can be treated with a stool softener or adding bulk to your diet. Drinking lots of fluids, vegetables, fruits, and whole grains are helpful.  Exercising is also helpful. If you have been very active up until your pregnancy, most of these activities can be continued during your pregnancy. If you have been less active, it is helpful to start an exercise program such as walking.  Hemorrhoids (varicose veins in the rectum) may develop at the end of the second trimester. Warm sitz baths and hemorrhoid cream recommended by your caregiver helps hemorrhoid problems.  Backaches may develop during this time of your pregnancy. Avoid heavy lifting, wear low heal shoes and practice good posture to help with backache problems.  Some pregnant women develop tingling and numbness of their hand and fingers because of swelling and tightening of ligaments in the wrist (carpel tunnel syndrome). This goes away after the baby is born.  As your breasts enlarge, you may have to get a bigger bra. Get a comfortable, cotton, support bra. Do not get a nursing bra until the last month of the pregnancy if you will be nursing the baby.  You may get a dark line from your belly button to the pubic area called the linea nigra.  You may develop rosy cheeks because of increase blood flow to the face.  You may develop spider looking lines of the face, neck, arms and chest. These go away after the baby is born. HOME CARE INSTRUCTIONS   It is extremely important to avoid all smoking, herbs, alcohol, and unprescribed drugs during your pregnancy. These chemicals affect the formation and growth of the baby. Avoid these chemicals throughout the pregnancy to ensure the delivery of a healthy infant.  Most of your home care instructions are the same as  suggested for the first trimester of your pregnancy. Keep your caregiver's appointments. Follow your caregiver's instructions regarding medication use, exercise and diet.  During pregnancy, you are providing food for you and your baby. Continue to eat regular, well-balanced meals. Choose foods such as meat, fish, milk and other low fat dairy products, vegetables, fruits, and whole-grain breads and cereals. Your caregiver will tell you of the ideal weight gain.  A physical sexual relationship may be continued up until near the end of pregnancy if there are no other problems. Problems could include early (premature) leaking of amniotic fluid from the membranes, vaginal bleeding, abdominal pain, or other medical or pregnancy problems.  Exercise regularly if there are no restrictions. Check with your caregiver if you are unsure of the safety of some of your exercises. The greatest weight gain will occur in the last 2 trimesters of pregnancy. Exercise will help you:  Control your weight.  Get you in shape for labor and delivery.  Lose weight   after you have the baby.  Wear a good support or jogging bra for breast tenderness during pregnancy. This may help if worn during sleep. Pads or tissues may be used in the bra if you are leaking colostrum.  Do not use hot tubs, steam rooms or saunas throughout the pregnancy.  Wear your seat belt at all times when driving. This protects you and your baby if you are in an accident.  Avoid raw meat, uncooked cheese, cat litter boxes and soil used by cats. These carry germs that can cause birth defects in the baby.  The second trimester is also a good time to visit your dentist for your dental health if this has not been done yet. Getting your teeth cleaned is OK. Use a soft toothbrush. Brush gently during pregnancy.  It is easier to loose urine during pregnancy. Tightening up and strengthening the pelvic muscles will help with this problem. Practice stopping your  urination while you are going to the bathroom. These are the same muscles you need to strengthen. It is also the muscles you would use as if you were trying to stop from passing gas. You can practice tightening these muscles up 10 times a set and repeating this about 3 times per day. Once you know what muscles to tighten up, do not perform these exercises during urination. It is more likely to contribute to an infection by backing up the urine.  Ask for help if you have financial, counseling or nutritional needs during pregnancy. Your caregiver will be able to offer counseling for these needs as well as refer you for other special needs.  Your skin may become oily. If so, wash your face with mild soap, use non-greasy moisturizer and oil or cream based makeup. MEDICATIONS AND DRUG USE IN PREGNANCY  Take prenatal vitamins as directed. The vitamin should contain 1 milligram of folic acid. Keep all vitamins out of reach of children. Only a couple vitamins or tablets containing iron may be fatal to a baby or young child when ingested.  Avoid use of all medications, including herbs, over-the-counter medications, not prescribed or suggested by your caregiver. Only take over-the-counter or prescription medicines for pain, discomfort, or fever as directed by your caregiver. Do not use aspirin.  Let your caregiver also know about herbs you may be using.  Alcohol is related to a number of birth defects. This includes fetal alcohol syndrome. All alcohol, in any form, should be avoided completely. Smoking will cause low birth rate and premature babies.  Street or illegal drugs are very harmful to the baby. They are absolutely forbidden. A baby born to an addicted mother will be addicted at birth. The baby will go through the same withdrawal an adult does. SEEK MEDICAL CARE IF:  You have any concerns or worries during your pregnancy. It is better to call with your questions if you feel they cannot wait, rather  than worry about them. SEEK IMMEDIATE MEDICAL CARE IF:   An unexplained oral temperature above 102 F (38.9 C) develops, or as your caregiver suggests.  You have leaking of fluid from the vagina (birth canal). If leaking membranes are suspected, take your temperature and tell your caregiver of this when you call.  There is vaginal spotting, bleeding, or passing clots. Tell your caregiver of the amount and how many pads are used. Light spotting in pregnancy is common, especially following intercourse.  You develop a bad smelling vaginal discharge with a change in the color from clear   to white.  You continue to feel sick to your stomach (nauseated) and have no relief from remedies suggested. You vomit blood or coffee ground-like materials.  You lose more than 2 pounds of weight or gain more than 2 pounds of weight over 1 week, or as suggested by your caregiver.  You notice swelling of your face, hands, feet, or legs.  You get exposed to German measles and have never had them.  You are exposed to fifth disease or chickenpox.  You develop belly (abdominal) pain. Round ligament discomfort is a common non-cancerous (benign) cause of abdominal pain in pregnancy. Your caregiver still must evaluate you.  You develop a bad headache that does not go away.  You develop fever, diarrhea, pain with urination, or shortness of breath.  You develop visual problems, blurry, or double vision.  You fall or are in a car accident or any kind of trauma.  There is mental or physical violence at home. Document Released: 07/15/2001 Document Revised: 10/13/2011 Document Reviewed: 01/17/2009 [employer] Patient Information 2013 [employer].  

## 2012-10-07 NOTE — Progress Notes (Signed)
Pulse- 79  Edema-feet/hands/" all over"  Pain/pressure-"stomach around belly button" and lower abd New ob packet given  Weight gain 11-20lb  Flu vaccine consent signed Pt questions about breastfeeding and + Hep B

## 2012-10-09 LAB — CULTURE, OB URINE

## 2012-10-12 ENCOUNTER — Encounter: Payer: Self-pay | Admitting: Advanced Practice Midwife

## 2012-10-12 DIAGNOSIS — R8271 Bacteriuria: Secondary | ICD-10-CM

## 2012-10-12 LAB — HEMOGLOBINOPATHY EVALUATION
Hemoglobin Other: 0 %
Hgb A2 Quant: 1.4 % — ABNORMAL LOW (ref 2.2–3.2)
Hgb A: 98.6 % — ABNORMAL HIGH (ref 96.8–97.8)
Hgb F Quant: 0 % (ref 0.0–2.0)
Hgb S Quant: 0 %

## 2012-10-12 NOTE — Progress Notes (Signed)
Subjective:    XYLAH EARLY is being seen today for her first obstetrical visit.  This is not a planned pregnancy. She is at [redacted]w[redacted]d gestation by LMP verified by 8.2 week [place]. Her obstetrical history is significant for Hep B, PTD at 36 weeks, Bipolar Disorder, late to care. Relationship with FOB: significant other, living together. Patient does intend to breast feed if safe w/ Hep B. Pregnancy history fully reviewed.  Menstrual History: OB History   Grav Para Term Preterm Abortions TAB SAB Ect Mult Living   5 2 1 1 2 1  0 1 0 2      Patient's last menstrual period was 06/07/2012.    The following portions of the patient's history were reviewed and updated as appropriate: allergies, current medications, past family history, past medical history, past social history, past surgical history and problem list.  Review of Systems A comprehensive review of systems was negative except for: mild generalized abd pain and pedal edema.     Objective:    BP 129/76  Temp(Src) 99 F (37.2 C)  Wt 95.119 kg (209 lb 11.2 oz)  BMI 37.16 kg/m2  LMP 06/07/2012 General appearance: alert, cooperative, appears stated age and no distress Head: Normocephalic, without obvious abnormality, atraumatic Neck: thyroid not enlarged, symmetric, no tenderness/mass/nodules Back: no CVAT Lungs: clear to auscultation bilaterally Heart: regular rate and rhythm, S1, S2 normal, no murmur, click, rub or gallop Abdomen: soft, non-tender; bowel sounds normal; no masses,  no organomegaly and Gravid, U/2, moderate bloating Neurologic: Alert and oriented X 3, normal strength and tone. Normal symmetric reflexes. Normal coordination and gait    Assessment:    Pregnancy at 17 and 3/7 weeks  Hep B complicating pregnancy, unspecified trimester - Plan: HIV Antibody ( Reflex), Culture, OB Urine, Hemoglobinopathy evaluation, Prenatal (OB Panel), Glucose Tolerance, 1 HR (50g),  Supervision of other high-risk pregnancy, second  trimester - Plan: US OB Detail, Prenat w/o A Vit-FeFum-FePo-FA (CONCEPT OB) 130-92.4-1 MG CAPS History of preterm delivery, currently pregnant, second trimester Late prenatal care complicating pregnancy, second trimester Mild generalized abd pain likely due to bloating.    Plan:    Initial labs drawn. Prenatal vitamins. Problem list reviewed and updated. AFP3 discussed: declined. Role of ultrasound in pregnancy discussed; fetal survey: ordered. Amniocentesis discussed: not indicated. Follow up in 1 weeks. 75% of 30 min visit spent on counseling and coordination of care.  Declined pelvic exam. Unsure of date of last Pap. Needs Pap, nutrition, SW at [place].  Preterm labor precautions.   Dorathy Kinsman, CNM

## 2012-10-13 LAB — OBSTETRIC PANEL
Antibody Screen: NEGATIVE
Basophils Absolute: 0 10*3/uL (ref 0.0–0.1)
HCT: 34 % — ABNORMAL LOW (ref 36.0–46.0)
Hemoglobin: 11.5 g/dL — ABNORMAL LOW (ref 12.0–15.0)
Lymphocytes Relative: 36 % (ref 12–46)
Lymphs Abs: 1.5 10*3/uL (ref 0.7–4.0)
MCV: 86.3 fL (ref 78.0–100.0)
Monocytes Absolute: 0.3 10*3/uL (ref 0.1–1.0)
Monocytes Relative: 7 % (ref 3–12)
Neutro Abs: 2.4 10*3/uL (ref 1.7–7.7)
RBC: 3.94 MIL/uL (ref 3.87–5.11)
Rh Type: POSITIVE
Rubella: 1.03 Index — ABNORMAL HIGH (ref <0.90)
WBC: 4.3 10*3/uL (ref 4.0–10.5)

## 2012-10-13 LAB — HIV ANTIBODY (ROUTINE TESTING W REFLEX): HIV: NONREACTIVE

## 2012-10-14 ENCOUNTER — Ambulatory Visit (INDEPENDENT_AMBULATORY_CARE_PROVIDER_SITE_OTHER): Payer: Medicaid Other | Admitting: Advanced Practice Midwife

## 2012-10-14 DIAGNOSIS — O98519 Other viral diseases complicating pregnancy, unspecified trimester: Secondary | ICD-10-CM

## 2012-10-14 NOTE — Progress Notes (Signed)
Ultrasound scheduled for 10/20/12 at 9:30 am. Left voicemail for manager for infectious disease to call patient with appointment for hepatitis B.

## 2012-10-14 NOTE — Progress Notes (Signed)
No c/o. 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 Anti-HBc and anti-HBc IgM drawn today, per Dr. Debroah Loop, pt needs ID consult - ordrered. Anatomy ultrasound ordered. Pap not done today, needs at next visit.

## 2012-10-19 ENCOUNTER — Encounter: Payer: Self-pay | Admitting: *Deleted

## 2012-10-19 ENCOUNTER — Ambulatory Visit: Payer: Medicaid Other | Admitting: Infectious Disease

## 2012-10-20 ENCOUNTER — Ambulatory Visit (HOSPITAL_COMMUNITY)
Admission: RE | Admit: 2012-10-20 | Discharge: 2012-10-20 | Disposition: A | Discharge status: Home / Self Care | Payer: Medicaid Other | Source: Ambulatory Visit | Attending: Advanced Practice Midwife | Admitting: Advanced Practice Midwife

## 2012-10-20 DIAGNOSIS — Z363 Encounter for antenatal screening for malformations: Secondary | ICD-10-CM | POA: Insufficient documentation

## 2012-10-20 DIAGNOSIS — Z1389 Encounter for screening for other disorder: Secondary | ICD-10-CM | POA: Insufficient documentation

## 2012-10-20 DIAGNOSIS — O09892 Supervision of other high risk pregnancies, second trimester: Secondary | ICD-10-CM

## 2012-10-20 DIAGNOSIS — O358XX0 Maternal care for other (suspected) fetal abnormality and damage, not applicable or unspecified: Secondary | ICD-10-CM | POA: Insufficient documentation

## 2012-10-21 ENCOUNTER — Ambulatory Visit: Payer: Medicaid Other

## 2012-10-23 ENCOUNTER — Inpatient Hospital Stay (HOSPITAL_COMMUNITY)
Admission: AD | Admit: 2012-10-23 | Discharge: 2012-10-23 | Disposition: A | Discharge status: Home / Self Care | Payer: Medicaid Other | Source: Ambulatory Visit | Attending: Obstetrics and Gynecology | Admitting: Obstetrics and Gynecology

## 2012-10-23 ENCOUNTER — Encounter (HOSPITAL_COMMUNITY): Payer: Self-pay

## 2012-10-23 DIAGNOSIS — O210 Mild hyperemesis gravidarum: Secondary | ICD-10-CM | POA: Insufficient documentation

## 2012-10-23 DIAGNOSIS — R112 Nausea with vomiting, unspecified: Secondary | ICD-10-CM

## 2012-10-23 DIAGNOSIS — R197 Diarrhea, unspecified: Secondary | ICD-10-CM | POA: Insufficient documentation

## 2012-10-23 LAB — CBC WITH DIFFERENTIAL/PLATELET
Basophils Relative: 0 % (ref 0–1)
Eosinophils Absolute: 0 10*3/uL (ref 0.0–0.7)
Eosinophils Relative: 0 % (ref 0–5)
HCT: 35.2 % — ABNORMAL LOW (ref 36.0–46.0)
Hemoglobin: 12 g/dL (ref 12.0–15.0)
Lymphs Abs: 0.6 10*3/uL — ABNORMAL LOW (ref 0.7–4.0)
MCH: 29.5 pg (ref 26.0–34.0)
MCHC: 34.1 g/dL (ref 30.0–36.0)
MCV: 86.5 fL (ref 78.0–100.0)
Monocytes Absolute: 0.3 10*3/uL (ref 0.1–1.0)
Monocytes Relative: 5 % (ref 3–12)
RBC: 4.07 MIL/uL (ref 3.87–5.11)

## 2012-10-23 LAB — URINE MICROSCOPIC-ADD ON

## 2012-10-23 LAB — URINALYSIS, ROUTINE W REFLEX MICROSCOPIC
Bilirubin Urine: NEGATIVE
Glucose, UA: NEGATIVE mg/dL
Hgb urine dipstick: NEGATIVE
Ketones, ur: >80 mg/dL — AB
Nitrite: NEGATIVE
pH: 6 (ref 5.0–8.0)

## 2012-10-23 MED ORDER — ONDANSETRON HCL 4 MG/2ML IJ SOLN
4.0000 mg | Freq: Once | INTRAMUSCULAR | Status: AC
  Administered 2012-10-23: 4 mg via INTRAVENOUS
  Filled 2012-10-23: qty 2

## 2012-10-23 MED ORDER — LACTATED RINGERS IV SOLN
25.0000 mg | Freq: Once | INTRAVENOUS | Status: AC
  Administered 2012-10-23: 25 mg via INTRAVENOUS
  Filled 2012-10-23: qty 1

## 2012-10-23 MED ORDER — PROMETHAZINE HCL 25 MG PO TABS: 25.0000 mg | ORAL_TABLET | Freq: Four times a day (QID) | ORAL | Status: DC | PRN

## 2012-10-23 MED ORDER — DEXTROSE IN LACTATED RINGERS 5 % IV SOLN
Freq: Once | INTRAVENOUS | Status: AC
  Administered 2012-10-23: 03:00:00 via INTRAVENOUS
  Filled 2012-10-23: qty 1000

## 2012-10-23 NOTE — MAU Provider Note (Signed)
History     CSN: [id number]  Arrival date and time: 10/23/12 0003   First Provider Initiated Contact with Patient 10/23/12 0019      Chief Complaint  Patient presents with  . Emesis  . Diarrhea   HPI Lauren Moss 22 y.o. [redacted]w[redacted]d  Comes to MAU tonight with vomiting and diarrhea that started at 10:30 am today.  Reports vomiting 5 times today.  Has lower abdominal pain and fever.  Reports unable to keep down food or fluids.  States father of the baby has had a virus with vomiting this week.  OB History   Grav Para Term Preterm Abortions TAB SAB Ect Mult Living   5 2 1 1 2 1  0 1 0 2      Past Medical History  Diagnosis Date  . Hepatitis B antibody positive   . Anemia   . Mental disorder     depression and bipolar  . Bipolar 1 disorder     hx cutting   . Chlamydia   . S/P dilation and curettage     for TAB and ectopic  . Asthma   . PONV (postoperative nausea and vomiting)     Past Surgical History  Procedure Laterality Date  . Liver biopsy    . No past surgeries    . Small intestine surgery      pt had intusseseption at 22 years old  . Dilation and curettage of uterus    . Skin graft      Family History  Problem Relation Age of Onset  . Adopted: Yes    History  Substance Use Topics  . Smoking status: Former Smoker    Quit date: 07/08/2012  . Smokeless tobacco: Never Used  . Alcohol Use: No    Allergies:  Allergies  Allergen Reactions  . Percocet (Oxycodone-Acetaminophen) Anaphylaxis  . Vicodin (Hydrocodone-Acetaminophen) Anaphylaxis    Prescriptions prior to admission  Medication Sig Dispense Refill  . ondansetron (ZOFRAN) 4 MG tablet Take 1 tablet (4 mg total) by mouth every 8 (eight) hours as needed for nausea.  20 tablet  2  . Prenat w/o A Vit-FeFum-FePo-FA (CONCEPT OB) 130-92.4-1 MG CAPS Take 1 tablet by mouth daily.  30 capsule  12  . Prenatal Vit-Fe Fumarate-FA (MULTIVITAMIN-PRENATAL) 27-0.8 MG TABS Take 1 tablet by mouth daily.      .  promethazine (PHENERGAN) 25 MG tablet Take 0.5 tablets (12.5 mg total) by mouth every 6 (six) hours as needed for nausea.  20 tablet  0    Review of Systems  Constitutional: Positive for fever.  Respiratory: Positive for shortness of breath. Negative for wheezing.   Cardiovascular: Positive for chest pain.  Gastrointestinal: Positive for nausea, vomiting, abdominal pain and diarrhea. Negative for constipation.  Genitourinary:       No vaginal discharge. No vaginal bleeding. No dysuria.   Physical Exam   Blood pressure 124/69, pulse 88, temperature 101.4 F (38.6 C), temperature source Oral, last menstrual period 06/07/2012, unknown if currently breastfeeding.  Physical Exam  Nursing note and vitals reviewed. Constitutional: She is oriented to person, place, and time. She appears well-developed and well-nourished. She appears distressed.  Crying with pain, rocking in bed  HENT:  Head: Normocephalic.  Eyes: EOM are normal.  Neck: Neck supple.  GI: Soft. There is tenderness. There is no rebound and no guarding.  Musculoskeletal: Normal range of motion.  Neurological: She is alert and oriented to person, place, and time.  Skin: Skin is warm  and dry.  Psychiatric: She has a normal mood and affect.    MAU Course  Procedures Results for orders placed during the hospital encounter of 10/23/12 (from the past 24 hour(s))  CBC WITH DIFFERENTIAL     Status: Abnormal   Collection Time    10/23/12 12:30 AM      Result Value Range   WBC 6.4  4.0 - 10.5 K/uL   RBC 4.07  3.87 - 5.11 MIL/uL   Hemoglobin 12.0  12.0 - 15.0 g/dL   HCT 16.1 (*) 09.6 - 04.5 %   MCV 86.5  78.0 - 100.0 fL   MCH 29.5  26.0 - 34.0 pg   MCHC 34.1  30.0 - 36.0 g/dL   RDW 40.9  81.1 - 91.4 %   Platelets 188  150 - 400 K/uL   Neutrophils Relative 86 (*) 43 - 77 %   Neutro Abs 5.5  1.7 - 7.7 K/uL   Lymphocytes Relative 9 (*) 12 - 46 %   Lymphs Abs 0.6 (*) 0.7 - 4.0 K/uL   Monocytes Relative 5  3 - 12 %    Monocytes Absolute 0.3  0.1 - 1.0 K/uL   Eosinophils Relative 0  0 - 5 %   Eosinophils Absolute 0.0  0.0 - 0.7 K/uL   Basophils Relative 0  0 - 1 %   Basophils Absolute 0.0  0.0 - 0.1 K/uL  URINALYSIS, ROUTINE W REFLEX MICROSCOPIC     Status: Abnormal   Collection Time    10/23/12  1:46 AM      Result Value Range   Color, Urine YELLOW  YELLOW   APPearance CLEAR  CLEAR   Specific Gravity, Urine >1.030 (*) 1.005 - 1.030   pH 6.0  5.0 - 8.0   Glucose, UA NEGATIVE  NEGATIVE mg/dL   Hgb urine dipstick NEGATIVE  NEGATIVE   Bilirubin Urine NEGATIVE  NEGATIVE   Ketones, ur >80 (*) NEGATIVE mg/dL   Protein, ur 30 (*) NEGATIVE mg/dL   Urobilinogen, UA 0.2  0.0 - 1.0 mg/dL   Nitrite NEGATIVE  NEGATIVE   Leukocytes, UA TRACE (*) NEGATIVE  URINE MICROSCOPIC-ADD ON     Status: Abnormal   Collection Time    10/23/12  1:46 AM      Result Value Range   Squamous Epithelial / LPF FEW (*) RARE   WBC, UA 3-6  <3 WBC/hpf   RBC / HPF 0-2  <3 RBC/hpf   Bacteria, UA FEW (*) RARE    MDM IVF LR 1000cc with Phenergan 25 mg to infuse.  Likely a GI illness since FOB has had vomiting.  Is possible this could be appendicitis.  Will give fluids and reassess. 0150  Half of fluids infused.  Client up to bathroom.  Looks much more comfortable.  No longer is crying.  Walks to bathroom.  Reports she still has nausea.  Will give Zofran 4 mg IVP. Urine specimen obtained.  Assessment and Plan  Nausea vomiting and diarrhea in pregnancy  Plan LR 1000cc with Phenergan 25 mg infused. d5LR with 10 mg of multivitamins infusing rx Phenergan 25 mg one po q 6 hours as needed for nausea Client has been resting in bed and not vomiting for the past 2 hours. BRAT client and advance slowly as tolerated.   BURLESON,TERRI 10/23/2012, 12:23 AM

## 2012-10-23 NOTE — MAU Note (Signed)
Onset of N/V/D today was exposed to stomach virus, abdominal pain which she states feels like contractions, history of Hep B.

## 2012-10-24 ENCOUNTER — Encounter: Payer: Self-pay | Admitting: Advanced Practice Midwife

## 2012-10-24 LAB — URINE CULTURE

## 2012-10-24 NOTE — MAU Provider Note (Signed)
Attestation of Attending Supervision of Advanced Practitioner: Evaluation and management procedures were performed by the PA/NP/CNM/OB Fellow under my supervision/collaboration. Chart reviewed and agree with management and plan.  Tilda Burrow 10/24/2012 9:47 PM

## 2012-10-26 ENCOUNTER — Ambulatory Visit: Payer: Medicaid Other | Admitting: Infectious Disease

## 2012-10-28 ENCOUNTER — Ambulatory Visit: Payer: Medicaid Other

## 2012-11-04 ENCOUNTER — Encounter: Payer: Medicaid Other | Admitting: Family Medicine

## 2012-11-04 ENCOUNTER — Telehealth: Payer: Self-pay | Admitting: Family Medicine

## 2012-11-04 NOTE — Telephone Encounter (Signed)
I called the patient to inform her of several missed appointments. I asked her if she was still pregnant. She stated she was. I told her I was just wondering why she was not able to make her appointment. She said let me let you talk to my advocate she will explain everything to you. Ms. Belle [place] got on the phone and proceeded to explain to me why Ms. Gainer had not been keeping her appointments. She stated she was homeless at this time, and is working with Social Services to get some help. She also stated she was working with [employer] to try to get some housing. Ms [place] stated [facility] was suppose to pick her up for her appointment this morning. Her appointment was at 10:30, and that's when they got to her house. Ms [place]il[place] stated she told the driver he should at least be there around 10:00 or 10:15. She said because things were so hectic right now, that she would call back later to make an appointment. I informed her it was important for her to keep her appointments due to the health of her baby. With Marchelle Folks Rash RN sitting next to me. She stated she knew this, but right now was just not a good time. I asked if there was any other way she could get here, and she stated she was not from here and didn't know anybody.

## 2012-12-20 ENCOUNTER — Encounter (HOSPITAL_COMMUNITY): Payer: Self-pay | Admitting: *Deleted

## 2012-12-20 ENCOUNTER — Inpatient Hospital Stay (HOSPITAL_COMMUNITY)
Admission: AD | Admit: 2012-12-20 | Discharge: 2012-12-20 | Disposition: A | Discharge status: Home / Self Care | Payer: Medicaid Other | Source: Ambulatory Visit | Attending: Obstetrics & Gynecology | Admitting: Obstetrics & Gynecology

## 2012-12-20 DIAGNOSIS — B1910 Unspecified viral hepatitis B without hepatic coma: Secondary | ICD-10-CM | POA: Insufficient documentation

## 2012-12-20 DIAGNOSIS — O26619 Liver and biliary tract disorders in pregnancy, unspecified trimester: Secondary | ICD-10-CM | POA: Insufficient documentation

## 2012-12-20 DIAGNOSIS — O4700 False labor before 37 completed weeks of gestation, unspecified trimester: Secondary | ICD-10-CM | POA: Insufficient documentation

## 2012-12-20 DIAGNOSIS — O479 False labor, unspecified: Secondary | ICD-10-CM

## 2012-12-20 LAB — WET PREP, GENITAL: Trich, Wet Prep: NONE SEEN

## 2012-12-20 LAB — URINALYSIS, ROUTINE W REFLEX MICROSCOPIC
Glucose, UA: NEGATIVE mg/dL
Hgb urine dipstick: NEGATIVE
Specific Gravity, Urine: 1.02 (ref 1.005–1.030)

## 2012-12-20 LAB — URINE MICROSCOPIC-ADD ON

## 2012-12-20 MED ORDER — METRONIDAZOLE 500 MG PO TABS: 500.0000 mg | ORAL_TABLET | Freq: Two times a day (BID) | ORAL | Status: DC

## 2012-12-20 NOTE — MAU Note (Signed)
Pt states she started to have some U/C's this morning about 0300 and doesn't know how often.  She also c/o a "brown glob" from vagina prior to the contractions starting.  Denies any vaginal bleeding at this time. No ROM.  Pt states she has not felt any fetal movement since she has been pregnant.

## 2012-12-20 NOTE — MAU Provider Note (Signed)
History     CSN: [id number]  Arrival date and time: 12/20/12 1236  Chief Complaint  Patient presents with  . Contractions   HPI Lauren Moss is a 22 y.o. [id number] at [redacted]w[redacted]d with history of preterm labor and Hep B who presents with complaints of contractions.  She reports that her contractions began this am at 0300.  They have been regular since this am but she does not know how often they are occurring.  Contractions were 7/10 in severity, but now have resolved.  No other complaints at this time.  She did, however, note some brownish discharge this am.  ROS: Denies Chest pain, SOB, fevers, chills, abdominal pain, vaginal bleeding. She reports headaches, Back pain, Right sided numbness.  She also reports normal vaginal discharge.    Past Medical History  Diagnosis Date  . Hepatitis B antibody positive   . Anemia   . Mental disorder     depression and bipolar  . Bipolar 1 disorder     hx cutting   . Chlamydia   . S/P dilation and curettage     for TAB and ectopic  . Asthma   . PONV (postoperative nausea and vomiting)     Past Surgical History  Procedure Laterality Date  . Liver biopsy    . No past surgeries    . Small intestine surgery      pt had intusseseption at 22 years old  . Dilation and curettage of uterus    . Skin graft      Family History  Problem Relation Age of Onset  . Adopted: Yes    History  Substance Use Topics  . Smoking status: Former Smoker    Quit date: 07/08/2012  . Smokeless tobacco: Never Used  . Alcohol Use: No    Allergies:  Allergies  Allergen Reactions  . Other Anaphylaxis    Curry seasoning  . Percocet (Oxycodone-Acetaminophen) Anaphylaxis  . Vicodin (Hydrocodone-Acetaminophen) Anaphylaxis    Prescriptions prior to admission  Medication Sig Dispense Refill  . ALBUTEROL SULFATE PO Take 1 puff by mouth as needed (rescue).      . calcium carbonate (TUMS - DOSED IN MG ELEMENTAL CALCIUM) 500 MG chewable tablet Chew 2 tablets  by mouth 3 (three) times daily as needed for heartburn.      . ondansetron (ZOFRAN) 4 MG tablet Take 1 tablet (4 mg total) by mouth every 8 (eight) hours as needed for nausea.  20 tablet  2  . Prenat w/o A Vit-FeFum-FePo-FA (CONCEPT OB) 130-92.4-1 MG CAPS Take 1 tablet by mouth daily.  30 capsule  12  . Prenatal Vit-Fe Fumarate-FA (MULTIVITAMIN-PRENATAL) 27-0.8 MG TABS Take 1 tablet by mouth daily.      . promethazine (PHENERGAN) 25 MG tablet Take 1 tablet (25 mg total) by mouth every 6 (six) hours as needed for nausea. May use 1/2 tablet for milder symptoms.  Sedation precautions.  20 tablet  0    ROS Physical Exam   Blood pressure 116/60, pulse 79, temperature 98.8 F (37.1 C), temperature source Oral, resp. rate 18, height 5\' 4"  (1.626 m), weight 98.068 kg (216 lb 3.2 oz), last menstrual period 06/07/2012, SpO2 99.00%, unknown if currently breastfeeding.  Physical Exam Gen: NAD Heart: RRR. No m/r/g. Lungs: CTAB.  Abd: gravid but otherwise soft, nontender to palpation Ext: no appreciable lower extremity edema bilaterally Neuro: grossly nonfocal, speech intact Pelvic Exam:        External: normal female genitalia without lesions or masses  Vagina: normal without lesions or masses.  White creamy discharge noted.        Cervix: unable to visualize.        Samples for Wet prep, GC/Chlamydia obtained   Dilation: Closed Cervical Position: Posterior Exam by:: L. Paschal. RN FHR: baseline 140, mod variability, 15X15 accels, no decels Toco: No contractions noted.    MAU Course  Procedures  Assessment and Plan  22 y.o. [id number] at [redacted]w[redacted]d with history of preterm labor and Hep B who presents with complaints of contractions.  Given history of preterm labor and lack of follow up (patient has not been receiving 17-P), will obtain further workup (GC/Chlamydia, fetal fibronectin, and wet prep).  15:30 - Fetal fibronectin negative.  Few clue cells seen on Wet prep.  Will treat with  Flagyl for BV and discharge home with close follow up (follow up arranged with Outpatient clinic).   [name] Other 12/20/2012, 1:41 PM   I saw and examined patient and agree with above resident note. I reviewed history, imaging, labs, and vitals. I personally reviewed the fetal heart tracing, and it is reactive. Napoleon Form, MD

## 2012-12-21 LAB — GC/CHLAMYDIA PROBE AMP
CT Probe RNA: NEGATIVE
GC Probe RNA: NEGATIVE

## 2012-12-28 NOTE — MAU Provider Note (Signed)
Attestation of Attending Supervision of Advanced Practitioner (CNM/NP): Evaluation and management procedures were performed by the Advanced Practitioner under my supervision and collaboration. I have reviewed the Advanced Practitioner's note and chart, and I agree with the management and plan.  Alizza Sacra H. 11:06 AM

## 2013-01-02 ENCOUNTER — Inpatient Hospital Stay (HOSPITAL_COMMUNITY)
Admission: AD | Admit: 2013-01-02 | Discharge: 2013-01-02 | Disposition: A | Discharge status: Home / Self Care | Payer: Medicaid Other | Source: Ambulatory Visit | Attending: Obstetrics and Gynecology | Admitting: Obstetrics and Gynecology

## 2013-01-02 ENCOUNTER — Encounter (HOSPITAL_COMMUNITY): Payer: Self-pay | Admitting: Family

## 2013-01-02 DIAGNOSIS — O99891 Other specified diseases and conditions complicating pregnancy: Secondary | ICD-10-CM

## 2013-01-02 DIAGNOSIS — J45909 Unspecified asthma, uncomplicated: Secondary | ICD-10-CM

## 2013-01-02 LAB — WET PREP, GENITAL
Trich, Wet Prep: NONE SEEN
Yeast Wet Prep HPF POC: NONE SEEN

## 2013-01-02 MED ORDER — IPRATROPIUM BROMIDE 0.02 % IN SOLN
0.5000 mg | RESPIRATORY_TRACT | Status: DC
  Administered 2013-01-02 (×2): 0.5 mg via RESPIRATORY_TRACT
  Filled 2013-01-02 (×2): qty 2.5

## 2013-01-02 MED ORDER — FLUCONAZOLE 150 MG PO TABS
150.0000 mg | ORAL_TABLET | Freq: Once | ORAL | Status: AC
  Administered 2013-01-02: 150 mg via ORAL
  Filled 2013-01-02: qty 1

## 2013-01-02 MED ORDER — ALBUTEROL SULFATE (2.5 MG/3ML) 0.083% IN NEBU: 2.5000 mg | INHALATION_SOLUTION | RESPIRATORY_TRACT | Status: DC | PRN

## 2013-01-02 MED ORDER — ALBUTEROL SULFATE (5 MG/ML) 0.5% IN NEBU
2.5000 mg | INHALATION_SOLUTION | RESPIRATORY_TRACT | Status: DC
  Administered 2013-01-02 (×2): 2.5 mg via RESPIRATORY_TRACT
  Filled 2013-01-02 (×2): qty 0.5

## 2013-01-02 NOTE — MAU Note (Addendum)
Patient presents to MAU with c/o asthma attack x 2 today; reports nebulizer is broken at home. Also reports she was treated for BV recently, however symptoms are worsening. Green vaginal discharge; vaginal itching & burning - reports feeling swollen in vagina.  Denies vaginal bleeding, LOF or cramping. Report +FM.

## 2013-01-02 NOTE — MAU Provider Note (Signed)
History     CSN: [id number]  Arrival date and time: 01/02/13 1944   None     No chief complaint on file.  HPI  Lauren Moss is a 22 y.o. [id number] at [redacted]w[redacted]d who presents tonight 2/2 asthma. She states that she has had an URI, and has been using her nebulizer at home. Her child has dropped it, and the machine cracked and does not work. She states that a MDI is generally not effective for her. She was also treated for BV a while back, and states that she is now having a lot of vulvar burning and swelling and itching. She denies any UCs, LOF, BV, and states that baby has been moving normally.    Truitt Merle, MD had been in to see the patient prior to shift change. She states that at that time O2 sat was WNL, and patient was no in resp distress. She had placed orders for breathing treatment.   When I was in the see the patient she reports feeling much better after breathing treatment.  Past Medical History  Diagnosis Date  . Hepatitis B antibody positive   . Anemia   . Mental disorder     depression and bipolar  . Bipolar 1 disorder     hx cutting   . Chlamydia   . S/P dilation and curettage     for TAB and ectopic  . Asthma   . PONV (postoperative nausea and vomiting)     Past Surgical History  Procedure Laterality Date  . Liver biopsy    . No past surgeries    . Small intestine surgery      pt had intusseseption at 22 years old  . Dilation and curettage of uterus    . Skin graft      Family History  Problem Relation Age of Onset  . Adopted: Yes    History  Substance Use Topics  . Smoking status: Former Smoker    Quit date: 07/08/2012  . Smokeless tobacco: Never Used  . Alcohol Use: No    Allergies:  Allergies  Allergen Reactions  . Other Anaphylaxis    Curry seasoning  . Percocet (Oxycodone-Acetaminophen) Anaphylaxis  . Vicodin (Hydrocodone-Acetaminophen) Anaphylaxis    Prescriptions prior to admission  Medication Sig Dispense Refill  . albuterol  (PROVENTIL HFA;VENTOLIN HFA) 108 (90 BASE) MCG/ACT inhaler Inhale 2 puffs into the lungs daily as needed for wheezing or shortness of breath.      . calcium carbonate (TUMS - DOSED IN MG ELEMENTAL CALCIUM) 500 MG chewable tablet Chew 2 tablets by mouth 3 (three) times daily as needed for heartburn.      . metroNIDAZOLE (FLAGYL) 500 MG tablet Take 1 tablet (500 mg total) by mouth 2 (two) times daily.  14 tablet  0  . ondansetron (ZOFRAN) 4 MG tablet Take 1 tablet (4 mg total) by mouth every 8 (eight) hours as needed for nausea.  20 tablet  2  . Prenatal Vit-Fe Fumarate-FA (PRENATAL MULTIVITAMIN) TABS Take 1 tablet by mouth daily at 12 noon.        Review of Systems  Constitutional: Negative for fever and chills.  Eyes: Negative for blurred vision.  Respiratory: Positive for wheezing.   Cardiovascular: Negative for chest pain.  Gastrointestinal: Negative for nausea, vomiting, abdominal pain, diarrhea and constipation.  Genitourinary: Negative for dysuria, urgency and frequency.  Musculoskeletal: Negative for myalgias.  Neurological: Negative for dizziness and headaches.   Physical Exam   Blood pressure  136/65, pulse 111, temperature 98.4 F (36.9 C), temperature source Oral, resp. rate 12, last menstrual period 06/07/2012, SpO2 100.00%, unknown if currently breastfeeding.  Physical Exam  Nursing note and vitals reviewed. Constitutional: She is oriented to person, place, and time. She appears well-developed and well-nourished. No distress.  Cardiovascular: Normal rate.   Respiratory: Effort normal. No respiratory distress. She has no wheezes. She has no rales. She exhibits no tenderness.  GI: She exhibits no distension.  Genitourinary:   External: no lesion Vagina:small amount of thick white discharge Cervix: pink, smooth, closed/thick/high Uterus: AGA  Neurological: She is alert and oriented to person, place, and time.  Skin: Skin is warm.  Psychiatric: She has a normal mood and  affect.   FHT: 140, moderate with 15x15 accels, no decels Toco: no UCs  MAU Course  Procedures  Results for orders placed during the hospital encounter of 01/02/13 (from the past 24 hour(s))  WET PREP, GENITAL     Status: Abnormal   Collection Time    01/02/13  8:33 PM      Result Value Range   Yeast Wet Prep HPF POC NONE SEEN  NONE SEEN   Trich, Wet Prep NONE SEEN  NONE SEEN   Clue Cells Wet Prep HPF POC FEW (*) NONE SEEN   WBC, Wet Prep HPF POC MODERATE (*) NONE SEEN     Assessment and Plan   1. Asthma complicating pregnancy, antepartum    Pt given new nebulizer by advance home care prior to DC from the unit Refilled nebulizer solution for the patient FU with Clinic as scheduled 3rd trimester danger signs reviewed.   Tawnya Crook 01/02/2013, 8:34 PM

## 2013-01-05 IMAGING — US US OB COMP LESS 14 WK
1 series · 14 of 23 positions shown · non-contrast
Comparison: none

[Series 1: us ob comp less 14 wks · 14 of 23 slices shown]
[im 1/23]
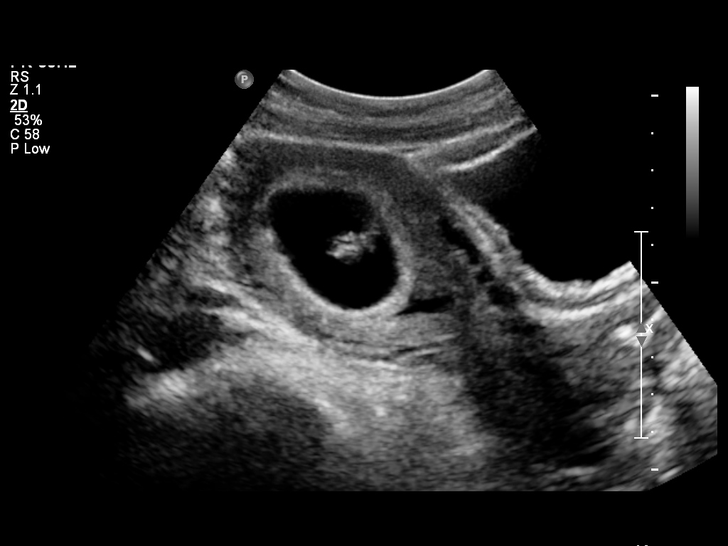
[im 3/23]
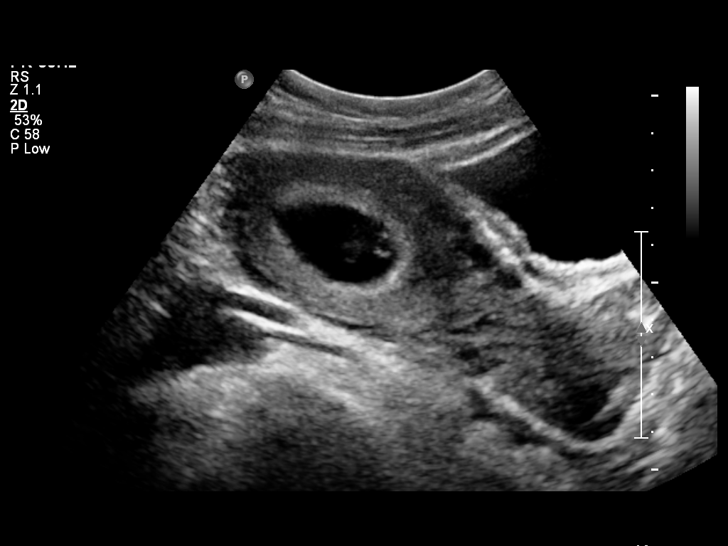
[im 5/23]
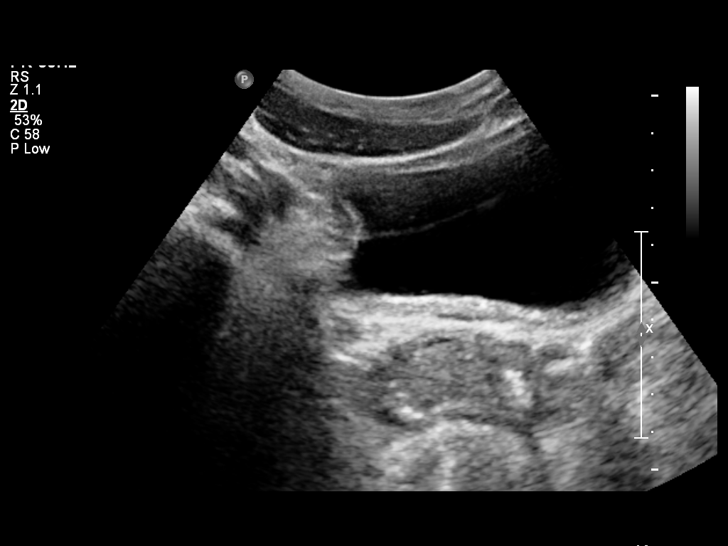
[im 6/23]
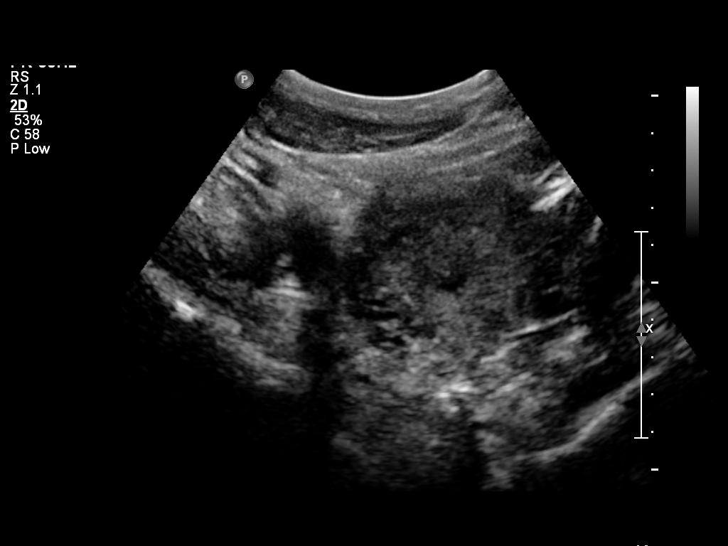
[im 8/23]
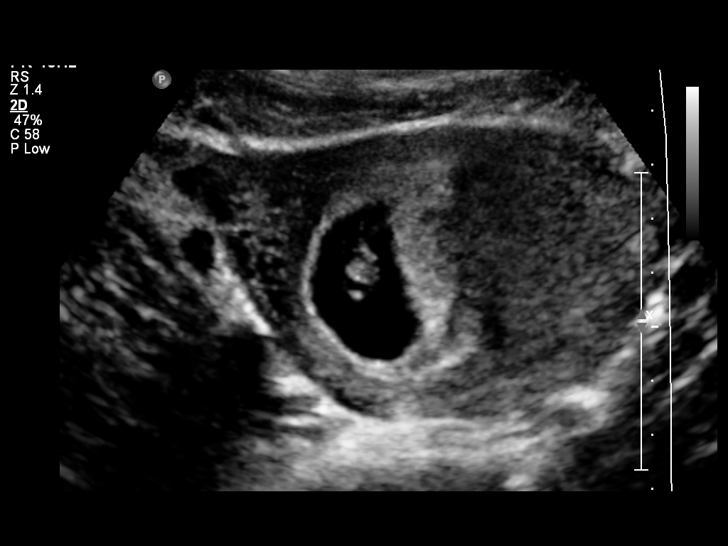
[im 10/23]
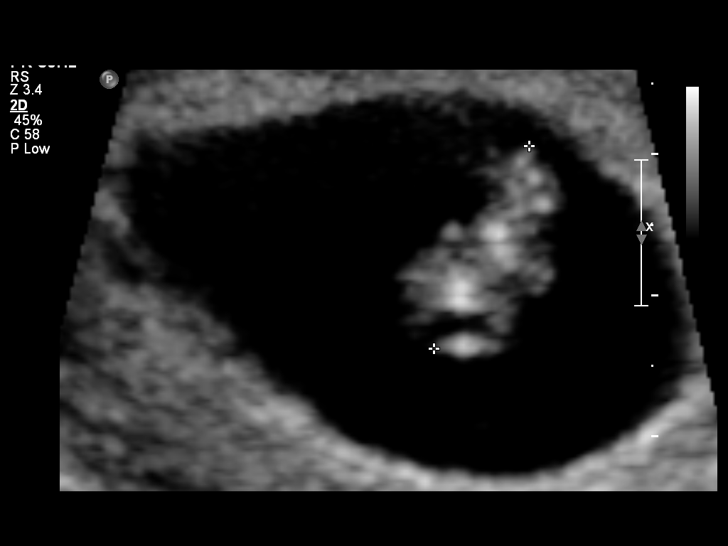
[im 11/23]
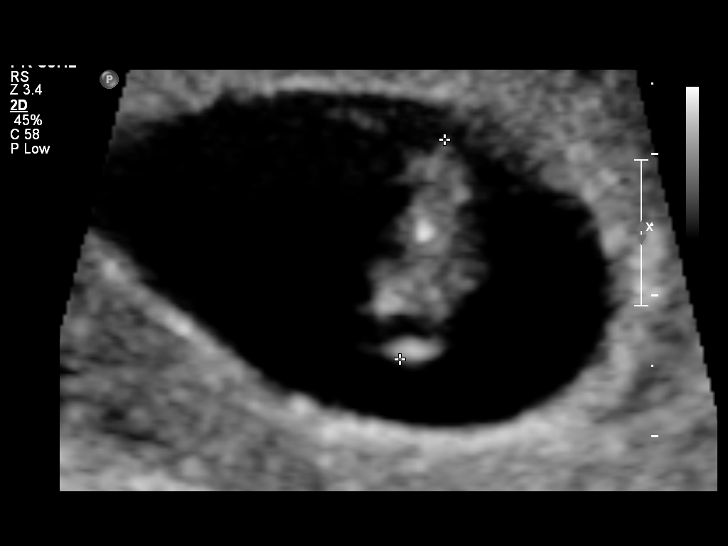
[im 13/23]
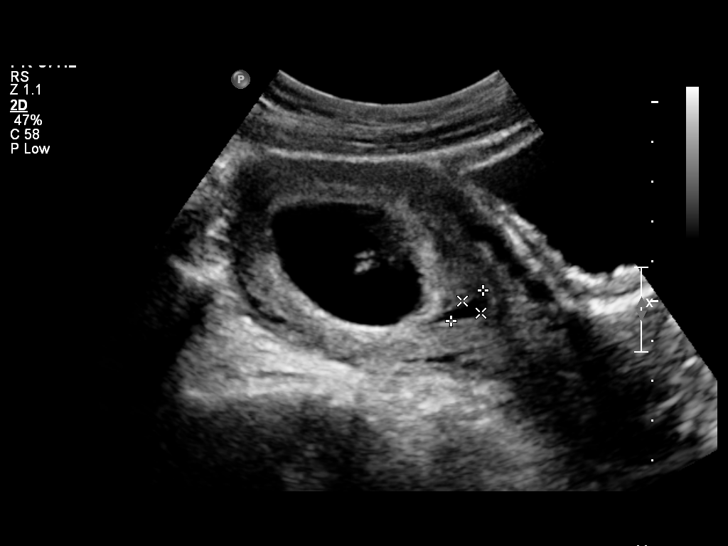
[im 14/23]
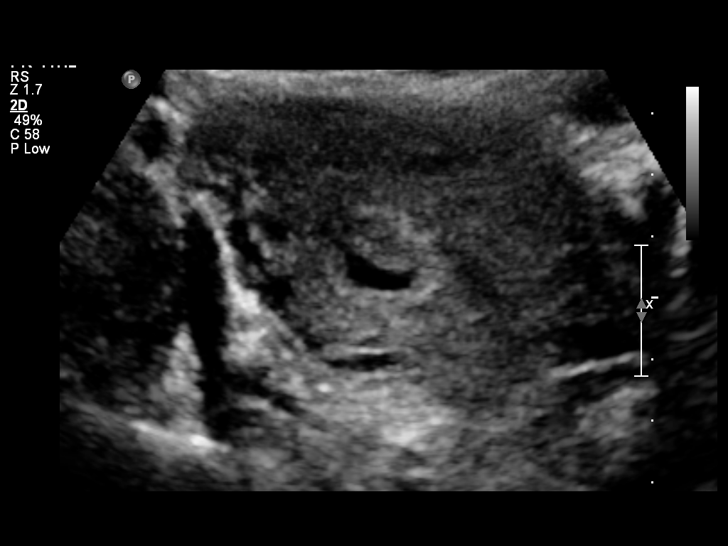
[im 16/23]
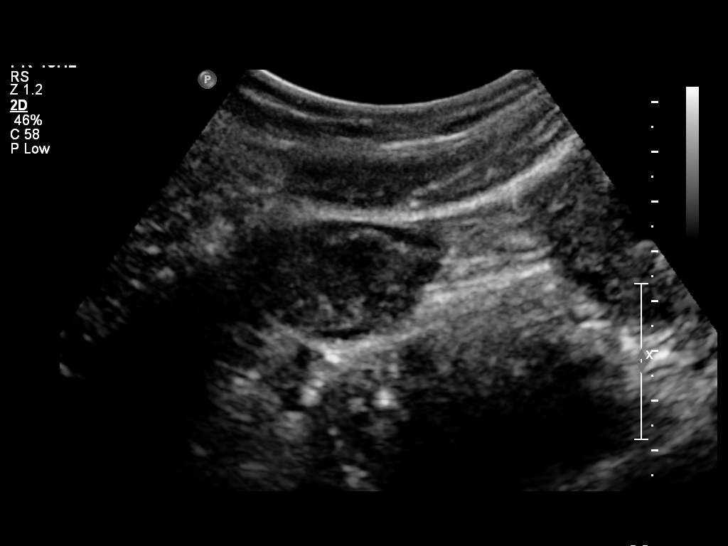
[im 18/23]
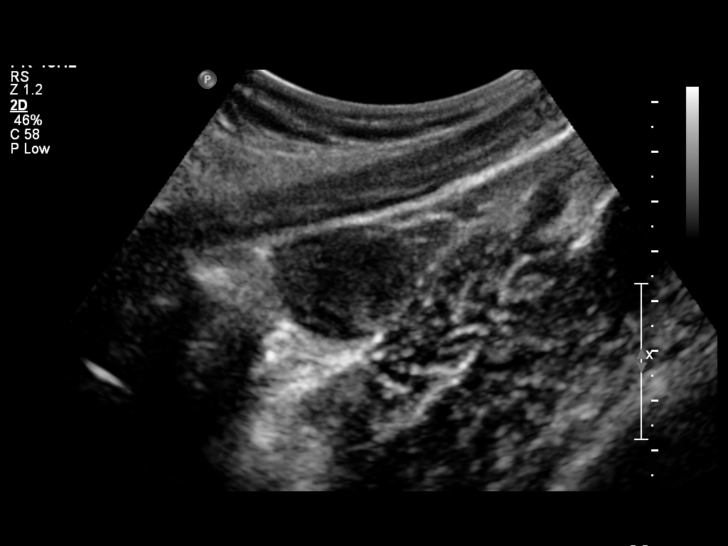
[im 19/23]
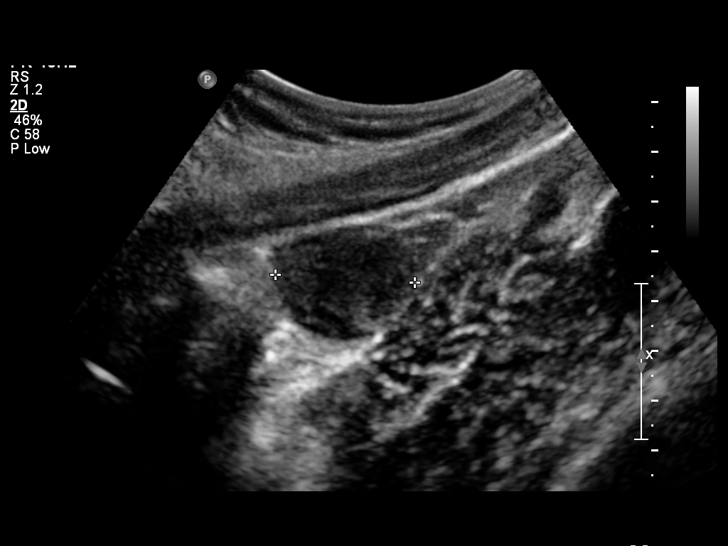
[im 21/23]
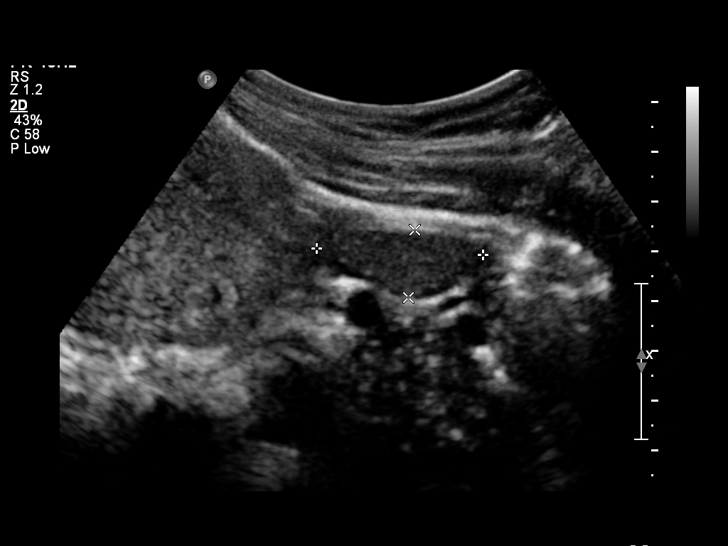
[im 23/23]
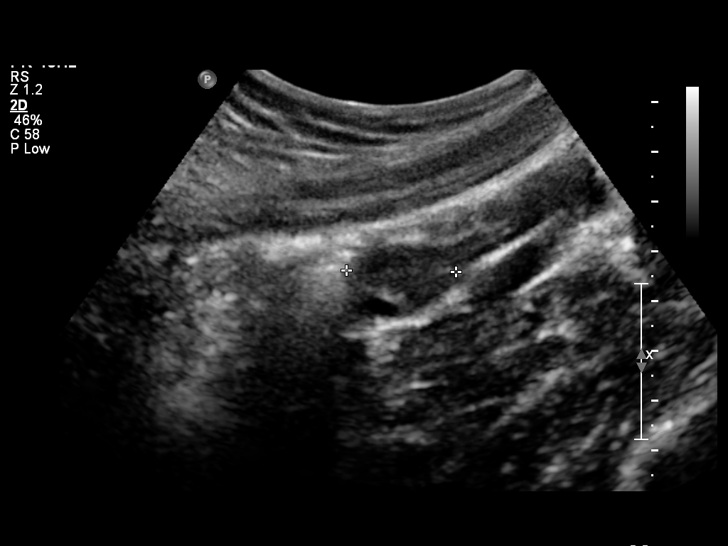

[14 of 23 positions shown; findings below may reference images not displayed]

OBSTETRICS REPORT
                      (Signed Final 10/12/2010 [DATE])

Procedures

 US OB COMP LESS 14 WKS                                76801.0
Indications

 Pain - Abdominal/Pelvic
Fetal Evaluation

 Preg. Location:    Intrauterine
 Gest. Sac:         Intrauterine, small
                    subchorionic bleed
 Yolk Sac:          Visualized
 Fetal Pole:        Visualized
 Fetal Heart Rate:  165                          bpm
 Cardiac Activity:  Observed
Biometry

 CRL:     15.8  mm     G. Age:  8w 0d                  EDD:    05/24/11
Gestational Age

 LMP:           7w 6d         Date:  08/18/10                 EDD:   05/25/11
 Best:          7w 6d      Det. By:  LMP  (08/18/10)          EDD:   05/25/11
Cervix Uterus Adnexa

 Cervix:       Normal appearance by transabdominal scan.
 Left Ovary:    Within normal limits.
 Right Ovary:   Within normal limits. Small corpus luteum noted.

 Adnexa:     No abnormality visualized.
Impression

 Intrauterine gestational sac, yolk sac, fetal pole, and cardiac
 activity noted.  Concordant GA by CRL and assigned GA by
 LMP.
 No acute abnormality.

## 2013-01-10 ENCOUNTER — Encounter: Payer: Medicaid Other | Admitting: Obstetrics & Gynecology

## 2013-01-11 NOTE — MAU Provider Note (Signed)
Attestation of Attending Supervision of Advanced Practitioner: Evaluation and management procedures were performed by the PA/NP/CNM/OB Fellow under my supervision/collaboration. Chart reviewed and agree with management and plan.  Florabelle Cardin V 01/11/2013 5:20 PM

## 2013-01-13 ENCOUNTER — Inpatient Hospital Stay (HOSPITAL_COMMUNITY)
Admission: AD | Admit: 2013-01-13 | Discharge: 2013-01-13 | Disposition: A | Discharge status: Home / Self Care | Payer: Medicaid Other | Source: Ambulatory Visit | Attending: Family Medicine | Admitting: Family Medicine

## 2013-01-13 ENCOUNTER — Encounter (HOSPITAL_COMMUNITY): Payer: Self-pay | Admitting: *Deleted

## 2013-01-13 DIAGNOSIS — O99891 Other specified diseases and conditions complicating pregnancy: Secondary | ICD-10-CM | POA: Insufficient documentation

## 2013-01-13 DIAGNOSIS — R0602 Shortness of breath: Secondary | ICD-10-CM | POA: Insufficient documentation

## 2013-01-13 DIAGNOSIS — O4700 False labor before 37 completed weeks of gestation, unspecified trimester: Secondary | ICD-10-CM | POA: Insufficient documentation

## 2013-01-13 DIAGNOSIS — O0930 Supervision of pregnancy with insufficient antenatal care, unspecified trimester: Secondary | ICD-10-CM | POA: Insufficient documentation

## 2013-01-13 HISTORY — DX: Major depressive disorder, single episode, unspecified: F32.9

## 2013-01-13 HISTORY — DX: Cardiac murmur, unspecified: R01.1

## 2013-01-13 HISTORY — DX: Depression, unspecified: F32.A

## 2013-01-13 LAB — WET PREP, GENITAL: Trich, Wet Prep: NONE SEEN

## 2013-01-13 MED ORDER — OXYCODONE-ACETAMINOPHEN 5-325 MG PO TABS: 2.0000 | ORAL_TABLET | Freq: Once | ORAL | Status: DC

## 2013-01-13 MED ORDER — FLUCONAZOLE 150 MG PO TABS
150.0000 mg | ORAL_TABLET | Freq: Once | ORAL | Status: AC
  Administered 2013-01-13: 150 mg via ORAL
  Filled 2013-01-13: qty 1

## 2013-01-13 MED ORDER — METRONIDAZOLE 500 MG PO TABS: 500.0000 mg | ORAL_TABLET | Freq: Two times a day (BID) | ORAL | Status: DC

## 2013-01-13 NOTE — MAU Note (Signed)
Pt arrived via EMS for preterm contractions and SOB.  MD at the bedside.

## 2013-01-13 NOTE — MAU Provider Note (Signed)
I examined pt and agree with documentation above and resident plan of care. Lauren Moss,Lauren Moss  

## 2013-01-13 NOTE — MAU Note (Signed)
Pt called back by [facility], need  For amnisure.

## 2013-01-13 NOTE — MAU Provider Note (Signed)
History     CSN: [id number]  Arrival date and time: 01/13/13 1258   First Provider Initiated Contact with Patient 01/13/13 1327      No chief complaint on file.  HPI KYLA DUFFY is a 22 y.o. [id number] at [redacted]w[redacted]d who presents with contractions.  Patient arrived via EMS after having contractions and SOB at pediatricians office today.   Patient reports severe contractions began earlier today while she was at her child's pediatrician's office.  Contractions were very severe and associated with SOB.  Patient also notes leaking of fluid x 2 yesterday.  No vaginal bleeding.  No other complaints currently.   *Of note, patient receives her care in the [facility].  She has had prior preterm delivery and has had very limited prenatal care.  She has not received 17-P and has attended any prenatal appointments since initial visit.  Past Medical History  Diagnosis Date  . Hepatitis B antibody positive   . Anemia   . Mental disorder     depression and bipolar  . Bipolar 1 disorder     hx cutting   . Chlamydia   . S/P dilation and curettage     for TAB and ectopic  . Asthma   . PONV (postoperative nausea and vomiting)   . Heart murmur   . Depression   . Bipolar disorder     Past Surgical History  Procedure Laterality Date  . Liver biopsy    . No past surgeries    . Small intestine surgery      pt had intusseseption at 22 years old  . Dilation and curettage of uterus    . Skin graft      Family History  Problem Relation Age of Onset  . Adopted: Yes    History  Substance Use Topics  . Smoking status: Former Smoker    Quit date: 07/08/2012  . Smokeless tobacco: Never Used  . Alcohol Use: No    Allergies:  Allergies  Allergen Reactions  . Other Anaphylaxis    Curry seasoning  . Percocet (Oxycodone-Acetaminophen) Anaphylaxis  . Vicodin (Hydrocodone-Acetaminophen) Anaphylaxis    Prescriptions prior to admission  Medication Sig Dispense Refill  . albuterol (PROVENTIL) (2.5  MG/3ML) 0.083% nebulizer solution Take 3 mLs (2.5 mg total) by nebulization every 4 (four) hours as needed for wheezing.  75 mL  12  . calcium carbonate (TUMS - DOSED IN MG ELEMENTAL CALCIUM) 500 MG chewable tablet Chew 2 tablets by mouth 3 (three) times daily as needed for heartburn.      . metroNIDAZOLE (FLAGYL) 500 MG tablet Take 1 tablet (500 mg total) by mouth 2 (two) times daily.  14 tablet  0  . ondansetron (ZOFRAN) 4 MG tablet Take 1 tablet (4 mg total) by mouth every 8 (eight) hours as needed for nausea.  20 tablet  2  . Prenatal Vit-Fe Fumarate-FA (PRENATAL MULTIVITAMIN) TABS Take 1 tablet by mouth daily at 12 noon.      Marland Kitchen albuterol (PROVENTIL) (2.5 MG/3ML) 0.083% nebulizer solution Take 2.5 mg by nebulization every 6 (six) hours as needed (asthma).        ROS Per HPI Physical Exam   Blood pressure 110/76, pulse 96, temperature 98.3 F (36.8 C), temperature source Oral, resp. rate 24, last menstrual period 06/07/2012, SpO2 100.00%, unknown if currently breastfeeding.  Physical Exam Gen: appears anxious and in mild distress Heart: RRR. No murmurs, rubs, or gallops. Lungs: CTAB. Abd: gravid but otherwise soft, nontender to palpation Ext:  no appreciable lower extremity edema bilaterally Neuro: no focal deficits. GU: normal appearing external genitalia Speculum exam (performed by [facility]) - questionable pooling, watery discharge noted.  No visible bleeding. Cervix: Internal os - closed (performed by [facility])  FHR: baseline 135, mod  variability, 15x15 accels, no decels Toco: occasional contractions     MAU Course  Procedures  Assessment and Plan  EARNESTEEN BIRNIE is a 22 y.o. [id number] at [redacted]w[redacted]d who presents with contractions and associated SOB - Cervix closed; physical exam benign. - Given reports of leaking fluid, fern test was done and was negative.  Patient had negative fetal fibronectin on 12/20/12. - Given limited prenatal care will obtain  GC/Chlamydia and Wet prep. - Will monitor patient closely via Toco and Continuous fetal monitoring.  1430 - Occasional contractions on Toco, Cervix closed, pain under control. Wet prep revealed BV and Yeast.  Will treat with Diflucan x 1 and 7 day course of flagyl.  Will obtain anmisure and discharge home.  1600 - Patient discharged prior to collection of amnisure.  Patient called and was still in waiting area waiting for ride. Patient brought back and amnisure collected.  Amnisure was negative.  Discharged home.  [name] Other 01/13/2013, 1:28 PM

## 2013-01-14 LAB — GC/CHLAMYDIA PROBE AMP
CT Probe RNA: NEGATIVE
GC Probe RNA: NEGATIVE

## 2013-02-06 ENCOUNTER — Inpatient Hospital Stay (HOSPITAL_COMMUNITY)
Admission: AD | Admit: 2013-02-06 | Discharge: 2013-02-06 | Disposition: A | Discharge status: Home / Self Care | Payer: Medicaid Other | Source: Ambulatory Visit | Attending: Obstetrics & Gynecology | Admitting: Obstetrics & Gynecology

## 2013-02-06 ENCOUNTER — Encounter (HOSPITAL_COMMUNITY): Payer: Self-pay | Admitting: *Deleted

## 2013-02-06 DIAGNOSIS — O26859 Spotting complicating pregnancy, unspecified trimester: Secondary | ICD-10-CM

## 2013-02-06 DIAGNOSIS — N949 Unspecified condition associated with female genital organs and menstrual cycle: Secondary | ICD-10-CM | POA: Insufficient documentation

## 2013-02-06 DIAGNOSIS — O4690 Antepartum hemorrhage, unspecified, unspecified trimester: Secondary | ICD-10-CM | POA: Insufficient documentation

## 2013-02-06 DIAGNOSIS — O99820 Streptococcus B carrier state complicating pregnancy: Secondary | ICD-10-CM

## 2013-02-06 LAB — URINALYSIS, ROUTINE W REFLEX MICROSCOPIC
Ketones, ur: NEGATIVE mg/dL
Nitrite: NEGATIVE
Specific Gravity, Urine: 1.015 (ref 1.005–1.030)
pH: 7 (ref 5.0–8.0)

## 2013-02-06 LAB — URINE MICROSCOPIC-ADD ON

## 2013-02-06 NOTE — MAU Provider Note (Signed)
History     CSN: [id number]  Arrival date and time: 02/06/13 1316   None     No chief complaint on file.  HPI In with c/o painless bleeding this a.m. That resolved but wanted to be checked to make sure baby is ok.  OB History   Grav Para Term Preterm Abortions TAB SAB Ect Mult Living   5 2 1 1 2 1  0 1 0 2      Past Medical History  Diagnosis Date  . Hepatitis B antibody positive   . Anemia   . Mental disorder     depression and bipolar  . Bipolar 1 disorder     hx cutting   . Chlamydia   . S/P dilation and curettage     for TAB and ectopic  . Asthma   . PONV (postoperative nausea and vomiting)   . Heart murmur   . Depression   . Bipolar disorder     Past Surgical History  Procedure Laterality Date  . Liver biopsy    . No past surgeries    . Small intestine surgery      pt had intusseseption at 22 years old  . Dilation and curettage of uterus    . Skin graft      Family History  Problem Relation Age of Onset  . Adopted: Yes    History  Substance Use Topics  . Smoking status: Former Smoker    Quit date: 07/08/2012  . Smokeless tobacco: Never Used  . Alcohol Use: No    Allergies:  Allergies  Allergen Reactions  . Other Anaphylaxis    Curry seasoning  . Percocet (Oxycodone-Acetaminophen) Anaphylaxis  . Vicodin (Hydrocodone-Acetaminophen) Anaphylaxis    Prescriptions prior to admission  Medication Sig Dispense Refill  . albuterol (PROVENTIL) (2.5 MG/3ML) 0.083% nebulizer solution Take 3 mLs (2.5 mg total) by nebulization every 4 (four) hours as needed for wheezing.  75 mL  12  . calcium carbonate (TUMS - DOSED IN MG ELEMENTAL CALCIUM) 500 MG chewable tablet Chew 2 tablets by mouth 3 (three) times daily as needed for heartburn.      . metroNIDAZOLE (FLAGYL) 500 MG tablet Take 1 tablet (500 mg total) by mouth 2 (two) times daily.  14 tablet  0  . ondansetron (ZOFRAN) 4 MG tablet Take 1 tablet (4 mg total) by mouth every 8 (eight) hours as needed for  nausea.  20 tablet  2  . Prenatal Vit-Fe Fumarate-FA (PRENATAL MULTIVITAMIN) TABS Take 1 tablet by mouth daily at 12 noon.        Review of Systems  Constitutional: Negative.   HENT: Negative.   Eyes: Negative.   Respiratory: Negative.   Cardiovascular: Negative.   Gastrointestinal: Negative.   Genitourinary:       Vag bleeding this a.m.  Musculoskeletal: Negative.   Skin: Negative.   Neurological: Negative.   Endo/Heme/Allergies: Negative.   Psychiatric/Behavioral: Negative.    Physical Exam   Blood pressure 122/69, pulse 88, temperature 98.1 F (36.7 C), temperature source Oral, resp. rate 18, height 5\' 3"  (1.6 m), last menstrual period 06/07/2012.  Physical Exam  Constitutional: She is oriented to person, place, and time. She appears well-developed and well-nourished. No distress.  HENT:  Head: Normocephalic.  Neck: Normal range of motion.  Cardiovascular: Normal rate, regular rhythm, normal heart sounds and intact distal pulses.   Respiratory: Effort normal and breath sounds normal.  GI: Soft.  Gravid and non tender.  Genitourinary: Vagina normal and  uterus normal.  Musculoskeletal: Normal range of motion.  Neurological: She is alert and oriented to person, place, and time. She has normal reflexes.  Skin: Skin is warm and dry. She is not diaphoretic.  Psychiatric: She has a normal mood and affect. Her behavior is normal. Judgment and thought content normal.    MAU Course  Procedures  MDM Stable maternal-fetal unit not in labor.  Assessment and Plan  SVE ft/th/high, no active bleeding with exam or noted on glove.  LAWSON,MARIE DARLENE 02/06/2013, 2:01 PM

## 2013-02-06 NOTE — MAU Provider Note (Signed)
Pt is term and no problems. Attestation of Attending Supervision of Advanced Practitioner (CNM/NP): Evaluation and management procedures were performed by the Advanced Practitioner under my supervision and collaboration. I have reviewed the Advanced Practitioner's note and chart, and I agree with the management and plan.  Jossilyn Benda H. 3:42 PM

## 2013-02-06 NOTE — MAU Note (Signed)
Pt reports she has been having vaginal bleeding since this morning. Was "thick" like a period but a little less now. Reports increased pelvic pressure especially when she uses the BR.

## 2013-02-07 LAB — URINE CULTURE

## 2013-02-15 ENCOUNTER — Inpatient Hospital Stay (HOSPITAL_COMMUNITY)
Admission: AD | Admit: 2013-02-15 | Discharge: 2013-02-15 | Disposition: A | Discharge status: Home / Self Care | Payer: Medicaid Other | Source: Ambulatory Visit | Attending: Family Medicine | Admitting: Family Medicine

## 2013-02-15 ENCOUNTER — Encounter (HOSPITAL_COMMUNITY): Payer: Self-pay | Admitting: *Deleted

## 2013-02-15 DIAGNOSIS — O4700 False labor before 37 completed weeks of gestation, unspecified trimester: Secondary | ICD-10-CM | POA: Insufficient documentation

## 2013-02-15 DIAGNOSIS — B951 Streptococcus, group B, as the cause of diseases classified elsewhere: Secondary | ICD-10-CM

## 2013-02-15 DIAGNOSIS — O26893 Other specified pregnancy related conditions, third trimester: Secondary | ICD-10-CM

## 2013-02-15 DIAGNOSIS — N949 Unspecified condition associated with female genital organs and menstrual cycle: Secondary | ICD-10-CM | POA: Insufficient documentation

## 2013-02-15 DIAGNOSIS — O99820 Streptococcus B carrier state complicating pregnancy: Secondary | ICD-10-CM

## 2013-02-15 DIAGNOSIS — N898 Other specified noninflammatory disorders of vagina: Secondary | ICD-10-CM

## 2013-02-15 DIAGNOSIS — O99891 Other specified diseases and conditions complicating pregnancy: Secondary | ICD-10-CM | POA: Insufficient documentation

## 2013-02-15 DIAGNOSIS — M549 Dorsalgia, unspecified: Secondary | ICD-10-CM | POA: Insufficient documentation

## 2013-02-15 MED ORDER — CYCLOBENZAPRINE HCL 10 MG PO TABS: 10.0000 mg | ORAL_TABLET | Freq: Once | ORAL | Status: DC

## 2013-02-15 MED ORDER — ACETAMINOPHEN 325 MG PO TABS
650.0000 mg | ORAL_TABLET | Freq: Four times a day (QID) | ORAL | Status: DC | PRN
  Administered 2013-02-15: 650 mg via ORAL
  Filled 2013-02-15: qty 2

## 2013-02-15 MED ORDER — NALBUPHINE SYRINGE 5 MG/0.5 ML
5.0000 mg | INJECTION | Freq: Once | INTRAMUSCULAR | Status: AC
  Administered 2013-02-15: 5 mg via SUBCUTANEOUS
  Filled 2013-02-15: qty 0.5

## 2013-02-15 NOTE — Progress Notes (Signed)
Pt states she had blurry vision earlier

## 2013-02-15 NOTE — MAU Note (Signed)
Patient states she had a gush of clear fluid about 20 minutes ago with pelvic pressure. No leaking at this time. Reports good fetal movement, states no contractions.

## 2013-02-15 NOTE — MAU Note (Signed)
Pt states she felt water pressure after she voided and had bowel movement. Pt states her boyfriend told her she was dripping water.Pt states she checked it and it did not 'smell' like urine

## 2013-02-15 NOTE — MAU Provider Note (Signed)
History     CSN: [id number]  Arrival date and time: 02/15/13 1532   First Provider Initiated Contact with Patient 02/15/13 1629      Chief Complaint  Patient presents with  . Rupture of Membranes  . Pelvic Pain   HPI This is a 22 y.o. female at [redacted]w[redacted]d who presents with c/o leaking fluid once today. Her boyfriend relates the story, she does not.  He states she bent over in pain with a contraction and when she stood up fluid leaked out. He states it did not smell like urine.  She has had no further leaking since that one episode.  No bleeding.   History is remarkable for preterm labor.  [facility] but has not been there "in a long time" and does not plan to go back "to be honest".  States we don't do anything for her there except tell her she has BV.  States she does not see the point in going back. Reports + GBS in urine   OB History   Grav Para Term Preterm Abortions TAB SAB Ect Mult Living   5 2 1 1 2 1  0 1 0 2      Past Medical History  Diagnosis Date  . Hepatitis B antibody positive   . Anemia   . Mental disorder     depression and bipolar  . Bipolar 1 disorder     hx cutting   . Chlamydia   . S/P dilation and curettage     for TAB and ectopic  . Asthma   . PONV (postoperative nausea and vomiting)   . Heart murmur   . Depression   . Bipolar disorder     Past Surgical History  Procedure Laterality Date  . Liver biopsy    . No past surgeries    . Small intestine surgery      pt had intusseseption at 22 years old  . Dilation and curettage of uterus    . Skin graft      Family History  Problem Relation Age of Onset  . Adopted: Yes    History  Substance Use Topics  . Smoking status: Former Smoker    Quit date: 07/08/2012  . Smokeless tobacco: Never Used  . Alcohol Use: No    Allergies:  Allergies  Allergen Reactions  . Other Anaphylaxis    Curry seasoning  . Percocet (Oxycodone-Acetaminophen) Anaphylaxis  . Vicodin  (Hydrocodone-Acetaminophen) Anaphylaxis    Prescriptions prior to admission  Medication Sig Dispense Refill  . albuterol (PROVENTIL) (2.5 MG/3ML) 0.083% nebulizer solution Take 3 mLs (2.5 mg total) by nebulization every 4 (four) hours as needed for wheezing.  75 mL  12  . calcium carbonate (TUMS - DOSED IN MG ELEMENTAL CALCIUM) 500 MG chewable tablet Chew 2 tablets by mouth 3 (three) times daily as needed for heartburn.      . Prenatal Vit-Fe Fumarate-FA (PRENATAL MULTIVITAMIN) TABS Take 1 tablet by mouth daily.         Review of Systems  Constitutional: Negative for fever and chills.  Gastrointestinal: Positive for abdominal pain. Negative for nausea, vomiting, diarrhea and constipation.  Genitourinary: Negative for dysuria.  Neurological: Negative for dizziness and headaches.   Physical Exam   Blood pressure 129/73, pulse 99, temperature 97.3 F (36.3 C), temperature source Oral, resp. rate 20, height 5\' 3"  (1.6 m), weight 99.791 kg (220 lb), last menstrual period 06/07/2012, SpO2 98.00%.  Physical Exam  Constitutional: She is oriented  to person, place, and time. She appears well-developed and well-nourished. No distress.  HENT:  Head: Normocephalic.  Cardiovascular: Normal rate.   Respiratory: Effort normal.  GI: Soft. She exhibits no distension and no mass. There is no tenderness. There is no rebound and no guarding.  Genitourinary: Uterus normal. Vaginal discharge (thick white, no pooling, no ferning) found.  Cervix long and closed/soft/ ballot.  Musculoskeletal: Normal range of motion.  Neurological: She is alert and oriented to person, place, and time.  Skin: Skin is warm and dry.  Psychiatric: She has a normal mood and affect.   FHR reactive Uterine irritability MAU Course  Procedures  MDM After telling patient test was negative, she developed sudden pain in lower back. States has never had pain like that before. Several mild contractions noted on monitor. Nubain  given for pain and Flexeril ordered. Later, pain suddenly disappeared and pt wanted to go home. Cervix rechecked, still closed and soft/ballotable.  Assessment and Plan  A:  SIUP at [redacted]w[redacted]d       No evidence of ruptured membranes      Back pain, probably related to UCs  P:  Discharge home       Labor precautions         Medication List         albuterol (2.5 MG/3ML) 0.083% nebulizer solution  Commonly known as:  PROVENTIL  Take 3 mLs (2.5 mg total) by nebulization every 4 (four) hours as needed for wheezing.     calcium carbonate 500 MG chewable tablet  Commonly known as:  TUMS - dosed in mg elemental calcium  Chew 2 tablets by mouth 3 (three) times daily as needed for heartburn.     cyclobenzaprine 10 MG tablet  Commonly known as:  FLEXERIL  Take 1 tablet (10 mg total) by mouth once.     prenatal multivitamin Tabs  Take 1 tablet by mouth daily.         [facility] 02/15/2013, 5:04 PM

## 2013-02-16 ENCOUNTER — Encounter (HOSPITAL_COMMUNITY): Payer: Self-pay | Admitting: Advanced Practice Midwife

## 2013-02-17 NOTE — MAU Provider Note (Signed)
Chart reviewed and agree with management and plan.  

## 2013-02-25 ENCOUNTER — Encounter (HOSPITAL_COMMUNITY): Payer: Self-pay | Admitting: *Deleted

## 2013-02-25 ENCOUNTER — Inpatient Hospital Stay (HOSPITAL_COMMUNITY)
Admission: AD | Admit: 2013-02-25 | Discharge: 2013-02-25 | Disposition: A | Discharge status: Home / Self Care | Payer: Medicaid Other | Source: Ambulatory Visit | Attending: Obstetrics & Gynecology | Admitting: Obstetrics & Gynecology

## 2013-02-25 DIAGNOSIS — R197 Diarrhea, unspecified: Secondary | ICD-10-CM | POA: Insufficient documentation

## 2013-02-25 DIAGNOSIS — M545 Low back pain, unspecified: Secondary | ICD-10-CM | POA: Insufficient documentation

## 2013-02-25 DIAGNOSIS — O0930 Supervision of pregnancy with insufficient antenatal care, unspecified trimester: Secondary | ICD-10-CM

## 2013-02-25 DIAGNOSIS — M549 Dorsalgia, unspecified: Secondary | ICD-10-CM

## 2013-02-25 DIAGNOSIS — O2390 Unspecified genitourinary tract infection in pregnancy, unspecified trimester: Secondary | ICD-10-CM | POA: Insufficient documentation

## 2013-02-25 DIAGNOSIS — O99820 Streptococcus B carrier state complicating pregnancy: Secondary | ICD-10-CM

## 2013-02-25 DIAGNOSIS — N390 Urinary tract infection, site not specified: Secondary | ICD-10-CM | POA: Insufficient documentation

## 2013-02-25 LAB — URINALYSIS, ROUTINE W REFLEX MICROSCOPIC
Ketones, ur: NEGATIVE mg/dL
Nitrite: NEGATIVE
Protein, ur: NEGATIVE mg/dL
pH: 6 (ref 5.0–8.0)

## 2013-02-25 LAB — URINE MICROSCOPIC-ADD ON

## 2013-02-25 LAB — WET PREP, GENITAL
Clue Cells Wet Prep HPF POC: NONE SEEN
Trich, Wet Prep: NONE SEEN

## 2013-02-25 LAB — OB RESULTS CONSOLE GBS: GBS: POSITIVE

## 2013-02-25 MED ORDER — TRAMADOL HCL 50 MG PO TABS
50.0000 mg | ORAL_TABLET | Freq: Once | ORAL | Status: AC
  Administered 2013-02-25: 50 mg via ORAL
  Filled 2013-02-25: qty 1

## 2013-02-25 MED ORDER — SODIUM CHLORIDE 0.9 % IV SOLN
Freq: Once | INTRAVENOUS | Status: AC
  Administered 2013-02-25: 17:00:00 via INTRAVENOUS

## 2013-02-25 MED ORDER — CEPHALEXIN 500 MG PO CAPS: 500.0000 mg | ORAL_CAPSULE | Freq: Two times a day (BID) | ORAL | Status: DC

## 2013-02-25 MED ORDER — TRAMADOL HCL 50 MG PO TABS: 50.0000 mg | ORAL_TABLET | Freq: Four times a day (QID) | ORAL | Status: DC | PRN

## 2013-02-25 MED ORDER — SODIUM CHLORIDE 0.9 % IV SOLN
INTRAVENOUS | Status: DC
  Administered 2013-02-25: 18:00:00 via INTRAVENOUS

## 2013-02-25 NOTE — MAU Provider Note (Signed)
History     CSN: [id number]  Arrival date and time: 02/25/13 1507   First Provider Initiated Contact with Patient 02/25/13 1620      Chief Complaint  Patient presents with  . Back Pain   HPI SARANDA LEGRANDE is a 22 y.o. [id number] at [redacted]w[redacted]d presents with complaints of severe low back pain radiating to bilateral legs. 10/10 sharp shooting pain. Pt reports slowly worsening over the last 3 days. Pt also has associated diarrhea for last 2 days. Pt states that she has not had BM in last 2weeks.  OB History   Grav Para Term Preterm Abortions TAB SAB Ect Mult Living   5 2 1 1 2 1  0 1 0 2      Past Medical History  Diagnosis Date  . Hepatitis B antibody positive   . Anemia   . Mental disorder     depression and bipolar  . Bipolar 1 disorder     hx cutting   . Chlamydia   . S/P dilation and curettage     for TAB and ectopic  . Asthma   . PONV (postoperative nausea and vomiting)   . Heart murmur   . Depression   . Bipolar disorder     Past Surgical History  Procedure Laterality Date  . Liver biopsy    . No past surgeries    . Small intestine surgery      pt had intusseseption at 22 years old  . Dilation and curettage of uterus    . Skin graft      Family History  Problem Relation Age of Onset  . Adopted: Yes    History  Substance Use Topics  . Smoking status: Former Smoker    Quit date: 07/08/2012  . Smokeless tobacco: Never Used  . Alcohol Use: No    Allergies:  Allergies  Allergen Reactions  . Other Anaphylaxis    Curry seasoning  . Percocet (Oxycodone-Acetaminophen) Anaphylaxis  . Vicodin (Hydrocodone-Acetaminophen) Anaphylaxis    Prescriptions prior to admission  Medication Sig Dispense Refill  . albuterol (PROVENTIL) (2.5 MG/3ML) 0.083% nebulizer solution Take 3 mLs (2.5 mg total) by nebulization every 4 (four) hours as needed for wheezing.  75 mL  12  . calcium carbonate (TUMS - DOSED IN MG ELEMENTAL CALCIUM) 500 MG chewable tablet Chew 2 tablets  by mouth 3 (three) times daily as needed for heartburn.      . cyclobenzaprine (FLEXERIL) 10 MG tablet Take 10 mg by mouth daily as needed for muscle spasms.      . Prenatal Vit-Fe Fumarate-FA (PRENATAL MULTIVITAMIN) TABS Take 1 tablet by mouth daily.       . [DISCONTINUED] cyclobenzaprine (FLEXERIL) 10 MG tablet Take 1 tablet (10 mg total) by mouth once.  30 tablet  0    Review of Systems  Constitutional: Negative for fever, chills, weight loss, malaise/fatigue and diaphoresis.  Eyes: Negative for blurred vision.  Respiratory: Negative for cough.   Cardiovascular: Negative for chest pain.  Gastrointestinal: Positive for constipation. Negative for heartburn and blood in stool.  Genitourinary: Positive for dysuria. Negative for urgency, frequency and hematuria.  Musculoskeletal: Positive for back pain and joint pain. Negative for falls.  Neurological: Negative for weakness and headaches.   Physical Exam   Blood pressure 125/80, pulse 86, temperature 98 F (36.7 C), temperature source Oral, resp. rate 19, height 5\' 3"  (1.6 m), weight 220 lb (99.791 kg), last menstrual period 06/07/2012, SpO2 99.00%.  Physical Exam  Constitutional:  She appears well-developed and well-nourished. Distressed: mild distress 2/2 pain.  HENT:  Head: Normocephalic and atraumatic.  Neck: Normal range of motion. Neck supple.  Cardiovascular: Normal rate, regular rhythm and normal heart sounds.   Respiratory: Effort normal and breath sounds normal. No respiratory distress. She has no wheezes. She has no rales. She exhibits no tenderness.  GI: Soft. Bowel sounds are normal. She exhibits no distension (gravid). There is no tenderness.  Genitourinary: Vagina normal. No breast swelling, tenderness, discharge or bleeding. No labial fusion. There is no rash, tenderness, lesion or injury on the right labia. There is no rash, tenderness, lesion or injury on the left labia. No erythema around the vagina. No signs of injury  around the vagina. Vaginal discharge: normal discharge of pregnancy.  SVE: 1/20/-3  [id number] + accels, no decls, mod variability irreg ctx   MAU Course  Procedures  MDM Showed patient stretching in exam room and had significant improvement in pain. Showed stretches to boyfriend as well.  Assessment and Plan  IVELIS NORGARD is a 22 y.o. [id number] at [redacted]w[redacted]d presents for actue back pain. Improved with stretching and fluids. Diarrhea is not uncommon for pt's constipation. Pt is significantly improved. # Back pain: showed stretching to partner. Will perform PRN at home. Will give Rx for tramadol as well # UTI: Urine + LE, + Bact - will give Rx for keflex 500mg  PO BID for 10days  Dispo Discharge home, encouraged pt be seen in [facility] clinic for follow up.  Jolyn Lent, RYAN 02/25/2013, 5:03 PM

## 2013-02-25 NOTE — MAU Note (Addendum)
Patient Lauren Moss presents with severe back pain and pressure that started yesterday afternoon; unsure if this may be contractions. Has taken Flexeril yesterday for pain but "did not work". Denies LOF, bleeding. Also states "feels dehydrated" and "so shaky I can't walk well".

## 2013-02-27 LAB — URINE CULTURE: Colony Count: >=100000

## 2013-02-28 ENCOUNTER — Encounter (HOSPITAL_COMMUNITY): Payer: Self-pay

## 2013-02-28 ENCOUNTER — Inpatient Hospital Stay (HOSPITAL_COMMUNITY)
Admission: AD | Admit: 2013-02-28 | Discharge: 2013-02-28 | Disposition: A | Discharge status: Home / Self Care | Payer: Medicaid Other | Source: Ambulatory Visit | Attending: Family Medicine | Admitting: Family Medicine

## 2013-02-28 DIAGNOSIS — O368190 Decreased fetal movements, unspecified trimester, not applicable or unspecified: Secondary | ICD-10-CM | POA: Insufficient documentation

## 2013-02-28 DIAGNOSIS — O368130 Decreased fetal movements, third trimester, not applicable or unspecified: Secondary | ICD-10-CM

## 2013-02-28 DIAGNOSIS — O479 False labor, unspecified: Secondary | ICD-10-CM | POA: Insufficient documentation

## 2013-02-28 DIAGNOSIS — O0933 Supervision of pregnancy with insufficient antenatal care, third trimester: Secondary | ICD-10-CM

## 2013-02-28 NOTE — MAU Note (Signed)
Patient is in with c/o no fetal movement since Friday. Patient states that her aunt "made me come in". She denies any pain or vaginal bleeding or lof.

## 2013-02-28 NOTE — MAU Provider Note (Signed)
Chart reviewed and agree with management and plan.  

## 2013-02-28 NOTE — MAU Provider Note (Signed)
Chief Complaint:  Decreased Fetal Movement   Lauren Moss is a 22 y.o.  [id number] with IUP at [redacted]w[redacted]d presenting for Decreased Fetal Movement . Patient states she has been having  Irregular sporadic contractions, none vaginal bleeding, intact membranes, with decreased  fetal movement.  States hasn't felt the baby move in 2 days.  wqas telling her Aunt who made her come and get checked out. She is not concerned as she thinks he is sleeping.  Pt has had no office visits since March as she is unable to get transportation.  States she does come to the MAU when something is wrong. She is seen in [facility] due to hepB status.      Menstrual History: OB History   Grav Para Term Preterm Abortions TAB SAB Ect Mult Living   5 2 1 1 2 1  0 1 0 2       Patient's last menstrual period was 06/07/2012.      Past Medical History  Diagnosis Date  . Hepatitis B antibody positive   . Anemia   . Mental disorder     depression and bipolar  . Bipolar 1 disorder     hx cutting   . Chlamydia   . S/P dilation and curettage     for TAB and ectopic  . Asthma   . PONV (postoperative nausea and vomiting)   . Heart murmur   . Depression   . Bipolar disorder     Past Surgical History  Procedure Laterality Date  . Liver biopsy    . No past surgeries    . Small intestine surgery      pt had intusseseption at 22 years old  . Dilation and curettage of uterus    . Skin graft      Family History  Problem Relation Age of Onset  . Adopted: Yes    History  Substance Use Topics  . Smoking status: Former Smoker    Quit date: 07/08/2012  . Smokeless tobacco: Never Used  . Alcohol Use: No      Allergies  Allergen Reactions  . Other Anaphylaxis    Curry seasoning  . Percocet (Oxycodone-Acetaminophen) Anaphylaxis  . Vicodin (Hydrocodone-Acetaminophen) Anaphylaxis    Prescriptions prior to admission  Medication Sig Dispense Refill  . albuterol (PROVENTIL) (2.5 MG/3ML) 0.083% nebulizer solution Take  3 mLs (2.5 mg total) by nebulization every 4 (four) hours as needed for wheezing.  75 mL  12  . calcium carbonate (TUMS - DOSED IN MG ELEMENTAL CALCIUM) 500 MG chewable tablet Chew 2 tablets by mouth 3 (three) times daily as needed for heartburn.      . cyclobenzaprine (FLEXERIL) 10 MG tablet Take 10 mg by mouth daily as needed for muscle spasms.      . Prenatal Vit-Fe Fumarate-FA (PRENATAL MULTIVITAMIN) TABS Take 1 tablet by mouth daily.       . cephALEXin (KEFLEX) 500 MG capsule Take 1 capsule (500 mg total) by mouth 2 (two) times daily.  20 capsule  0  . traMADol (ULTRAM) 50 MG tablet Take 1 tablet (50 mg total) by mouth every 6 (six) hours as needed for pain.  15 tablet  0    Review of Systems - Negative except for what is mentioned in HPI.  Physical Exam  Blood pressure 136/77, pulse 94, temperature 98.4 F (36.9 C), resp. rate 20, height 5\' 3"  (1.6 m), weight 101.969 kg (224 lb 12.8 oz), last menstrual period 06/07/2012. GENERAL: Well-developed, well-nourished female  in no acute distress.  LUNGS: Clear to auscultation bilaterally.  HEART: Regular rate and rhythm. ABDOMEN: Soft, nontender, nondistended, gravid.  EXTREMITIES: Nontender, no edema, 2+ distal pulses. Cervical Exam: Dilatation 1cm   Effacement 20%   Station ballotable   Presentation: likely vertex by leopolds FHT:  Baseline rate 140 bpm   Variability moderate  Accelerations present   Decelerations none Contractions: quiet   Labs: No results found for this or any previous visit (from the past 24 hour(s)).  Imaging Studies:  No results found.  Assessment: Lauren Moss is  22 y.o. [id number] at [redacted]w[redacted]d presents with Decreased Fetal Movement .  Plan:  Placed on NST and beautifully reactive with no decels- category I Pt reassured by the sound of movements - discussed difficulty with [facility] and options for rides. Pt states she will try and get her aunt to take her this week.  - kick counts reviewed - GBS and gc/chl done  at last visit - handout provided - d/c to home with strict return precautions    Braylei Totino L 7/28/20144:54 PM

## 2013-02-28 NOTE — MAU Note (Signed)
No FM for 2 days, uc's continue, denies bleeding or LOF.

## 2013-03-01 LAB — CULTURE, BETA STREP (GROUP B ONLY)

## 2013-03-10 ENCOUNTER — Inpatient Hospital Stay (HOSPITAL_COMMUNITY)
Admission: AD | Admit: 2013-03-10 | Discharge: 2013-03-13 | DRG: 775 | Disposition: A | Discharge status: Home / Self Care | Payer: Medicaid Other | Source: Ambulatory Visit | Attending: Family Medicine | Admitting: Family Medicine

## 2013-03-10 ENCOUNTER — Encounter (HOSPITAL_COMMUNITY): Payer: Self-pay | Admitting: *Deleted

## 2013-03-10 ENCOUNTER — Inpatient Hospital Stay (HOSPITAL_COMMUNITY)
Admission: AD | Admit: 2013-03-10 | Discharge: 2013-03-10 | Disposition: A | Discharge status: Home / Self Care | Payer: Medicaid Other | Source: Ambulatory Visit | Attending: Obstetrics & Gynecology | Admitting: Obstetrics & Gynecology

## 2013-03-10 ENCOUNTER — Encounter (HOSPITAL_COMMUNITY): Payer: Self-pay

## 2013-03-10 DIAGNOSIS — O0930 Supervision of pregnancy with insufficient antenatal care, unspecified trimester: Secondary | ICD-10-CM

## 2013-03-10 DIAGNOSIS — O99892 Other specified diseases and conditions complicating childbirth: Principal | ICD-10-CM | POA: Diagnosis present

## 2013-03-10 DIAGNOSIS — R21 Rash and other nonspecific skin eruption: Secondary | ICD-10-CM | POA: Diagnosis not present

## 2013-03-10 DIAGNOSIS — Z22330 Carrier of Group B streptococcus: Secondary | ICD-10-CM

## 2013-03-10 DIAGNOSIS — O479 False labor, unspecified: Secondary | ICD-10-CM | POA: Insufficient documentation

## 2013-03-10 DIAGNOSIS — O0933 Supervision of pregnancy with insufficient antenatal care, third trimester: Secondary | ICD-10-CM

## 2013-03-10 DIAGNOSIS — O99820 Streptococcus B carrier state complicating pregnancy: Secondary | ICD-10-CM

## 2013-03-10 MED ORDER — FENTANYL CITRATE 0.05 MG/ML IJ SOLN
100.0000 ug | INTRAMUSCULAR | Status: DC | PRN
  Administered 2013-03-10 – 2013-03-11 (×2): 100 ug via INTRAVENOUS
  Filled 2013-03-10 (×2): qty 2

## 2013-03-10 MED ORDER — LACTATED RINGERS IV SOLN
INTRAVENOUS | Status: DC
  Administered 2013-03-10: via INTRAVENOUS

## 2013-03-10 NOTE — MAU Note (Addendum)
Having contractions, started around 1030,   irreg 5-8min.  No bleeding or leaking, no problems with preg, was 1 cm when last checked. +GBS

## 2013-03-10 NOTE — MAU Note (Signed)
Contractions earlier tonight. Denies leaking of fluid or vaginal bleeding. Positive fetal movement.

## 2013-03-11 ENCOUNTER — Encounter (HOSPITAL_COMMUNITY): Payer: Self-pay | Admitting: *Deleted

## 2013-03-11 DIAGNOSIS — R21 Rash and other nonspecific skin eruption: Secondary | ICD-10-CM

## 2013-03-11 DIAGNOSIS — O0930 Supervision of pregnancy with insufficient antenatal care, unspecified trimester: Secondary | ICD-10-CM

## 2013-03-11 DIAGNOSIS — O9989 Other specified diseases and conditions complicating pregnancy, childbirth and the puerperium: Secondary | ICD-10-CM

## 2013-03-11 LAB — RPR: RPR Ser Ql: NONREACTIVE

## 2013-03-11 LAB — CBC
Hemoglobin: 11.6 g/dL — ABNORMAL LOW (ref 12.0–15.0)
MCH: 27.6 pg (ref 26.0–34.0)
MCHC: 32.9 g/dL (ref 30.0–36.0)
Platelets: 218 10*3/uL (ref 150–400)
RDW: 15.2 % (ref 11.5–15.5)

## 2013-03-11 LAB — TYPE AND SCREEN
ABO/RH(D): A POS
Antibody Screen: NEGATIVE

## 2013-03-11 MED ORDER — ONDANSETRON HCL 4 MG/2ML IJ SOLN: 4.0000 mg | INTRAMUSCULAR | Status: DC | PRN

## 2013-03-11 MED ORDER — ONDANSETRON HCL 4 MG PO TABS: 4.0000 mg | ORAL_TABLET | ORAL | Status: DC | PRN

## 2013-03-11 MED ORDER — OXYTOCIN 40 UNITS IN LACTATED RINGERS INFUSION - SIMPLE MED
62.5000 mL/h | INTRAVENOUS | Status: DC
  Filled 2013-03-11: qty 1000

## 2013-03-11 MED ORDER — SIMETHICONE 80 MG PO CHEW: 80.0000 mg | CHEWABLE_TABLET | ORAL | Status: DC | PRN

## 2013-03-11 MED ORDER — BENZOCAINE-MENTHOL 20-0.5 % EX AERO
1.0000 "application " | INHALATION_SPRAY | CUTANEOUS | Status: DC | PRN
  Filled 2013-03-11: qty 56

## 2013-03-11 MED ORDER — DIBUCAINE 1 % RE OINT: 1.0000 "application " | TOPICAL_OINTMENT | RECTAL | Status: DC | PRN

## 2013-03-11 MED ORDER — OXYTOCIN BOLUS FROM INFUSION
500.0000 mL | INTRAVENOUS | Status: DC
  Administered 2013-03-11: 500 mL via INTRAVENOUS

## 2013-03-11 MED ORDER — IBUPROFEN 600 MG PO TABS: 600.0000 mg | ORAL_TABLET | Freq: Four times a day (QID) | ORAL | Status: DC | PRN

## 2013-03-11 MED ORDER — ONDANSETRON HCL 4 MG/2ML IJ SOLN: 4.0000 mg | Freq: Four times a day (QID) | INTRAMUSCULAR | Status: DC | PRN

## 2013-03-11 MED ORDER — LACTATED RINGERS IV SOLN: INTRAVENOUS | Status: DC

## 2013-03-11 MED ORDER — PRENATAL MULTIVITAMIN CH
1.0000 | ORAL_TABLET | Freq: Every day | ORAL | Status: DC
  Administered 2013-03-11 – 2013-03-12 (×2): 1 via ORAL
  Filled 2013-03-11 (×3): qty 1

## 2013-03-11 MED ORDER — LACTATED RINGERS IV SOLN: 500.0000 mL | INTRAVENOUS | Status: DC | PRN

## 2013-03-11 MED ORDER — DIPHENHYDRAMINE HCL 25 MG PO CAPS: 25.0000 mg | ORAL_CAPSULE | Freq: Four times a day (QID) | ORAL | Status: DC | PRN

## 2013-03-11 MED ORDER — CITRIC ACID-SODIUM CITRATE 334-500 MG/5ML PO SOLN: 30.0000 mL | ORAL | Status: DC | PRN

## 2013-03-11 MED ORDER — ZOLPIDEM TARTRATE 5 MG PO TABS: 5.0000 mg | ORAL_TABLET | Freq: Every evening | ORAL | Status: DC | PRN

## 2013-03-11 MED ORDER — LANOLIN HYDROUS EX OINT: TOPICAL_OINTMENT | CUTANEOUS | Status: DC | PRN

## 2013-03-11 MED ORDER — SODIUM CHLORIDE 0.9 % IV SOLN
2.0000 g | Freq: Once | INTRAVENOUS | Status: DC
  Filled 2013-03-11: qty 2000

## 2013-03-11 MED ORDER — SENNOSIDES-DOCUSATE SODIUM 8.6-50 MG PO TABS
2.0000 | ORAL_TABLET | Freq: Every day | ORAL | Status: DC
  Administered 2013-03-11 – 2013-03-12 (×2): 2 via ORAL

## 2013-03-11 MED ORDER — WITCH HAZEL-GLYCERIN EX PADS: 1.0000 "application " | MEDICATED_PAD | CUTANEOUS | Status: DC | PRN

## 2013-03-11 MED ORDER — IBUPROFEN 600 MG PO TABS
600.0000 mg | ORAL_TABLET | Freq: Four times a day (QID) | ORAL | Status: DC
  Administered 2013-03-11 – 2013-03-13 (×8): 600 mg via ORAL
  Filled 2013-03-11 (×9): qty 1

## 2013-03-11 MED ORDER — TETANUS-DIPHTH-ACELL PERTUSSIS 5-2.5-18.5 LF-MCG/0.5 IM SUSP: 0.5000 mL | Freq: Once | INTRAMUSCULAR | Status: DC

## 2013-03-11 MED ORDER — LIDOCAINE HCL (PF) 1 % IJ SOLN
30.0000 mL | INTRAMUSCULAR | Status: DC | PRN
  Filled 2013-03-11: qty 30

## 2013-03-11 NOTE — H&P (Signed)
Lauren Moss is a 22 y.o. female [id number] at 39.4wks presenting via EMS for contractions. Denies leak or bldg. Denies dysuria, H/A, or N/V. Her pregnancy was followed for two visits only by [facility], both prior to 20wks. She was noted to be at risk for 1) hx of PTD at 36wks 2) bipolar/depression 3) +HBsAG 4) +GBS in urine History OB History   Grav Para Term Preterm Abortions TAB SAB Ect Mult Living   5 2 1 1 2 1  0 1 0 2     Past Medical History  Diagnosis Date  . Hepatitis B antibody positive   . Anemia   . Mental disorder     depression and bipolar  . Bipolar 1 disorder     hx cutting   . Chlamydia   . S/P dilation and curettage     for TAB and ectopic  . Asthma   . PONV (postoperative nausea and vomiting)   . Heart murmur   . Depression   . Bipolar disorder    Past Surgical History  Procedure Laterality Date  . Liver biopsy    . No past surgeries    . Small intestine surgery      pt had intusseseption at 22 years old  . Dilation and curettage of uterus    . Skin graft     Family History: family history is not on file. She is adopted. Social History:  reports that she quit smoking about 8 months ago. She has never used smokeless tobacco. She reports that she does not drink alcohol or use illicit drugs.   Prenatal Transfer Tool  Maternal Diabetes: No- had early glucola only Genetic Screening: Declined Maternal Ultrasounds/Referrals: Normal Fetal Ultrasounds or other Referrals:  None Maternal Substance Abuse:  No Significant Maternal Medications:  None Significant Maternal Lab Results:  Lab values include: Group B Strep positive, HBsAG positive Other Comments:  very limited PNC- two visits both prior to 20wks  ROS  Dilation: 6.5 Effacement (%): 90 Station: -1 Exam by:: Surveyor, quantity  Blood pressure 138/58, pulse 91, resp. rate 20, last menstrual period 06/07/2012.  Initial cx exam: 3/90  Maternal Exam:  Uterine Assessment: Ctx q 4-6 mins     Fetal  Exam Fetal Monitor Review: Baseline rate: 145.  Variability: moderate (6-25 bpm).   Pattern: accelerations present and no decelerations.       Physical Exam  Constitutional: She is oriented to person, place, and time. She appears well-developed.  HENT:  Head: Normocephalic.  Neck: Normal range of motion.  Cardiovascular: Normal rate.   Respiratory: Effort normal.  Genitourinary: Vagina normal.  Musculoskeletal: Normal range of motion.  Neurological: She is alert and oriented to person, place, and time.  Skin: Skin is warm and dry.  Psychiatric: She has a normal mood and affect. Her behavior is normal. Thought content normal.    Prenatal labs: ABO, Rh: A/POS/-- (03/06 1219) Antibody: NEG (03/06 1219) Rubella: 1.03 (03/06 1219) RPR: NON REAC (03/06 1219)  HBsAg: POSITIVE (03/06 1219)  HIV: NON REACTIVE (03/06 1219)  GBS: Positive (07/25 0000)   Assessment/Plan: IUP at 39.4wks Active labor GBS pos  Admit to [facility] Expectant management Anticipate SVD Amp for GBS prophylaxis due to precipitous labor   Akasia Ahmad 03/11/2013, 2:15 AM

## 2013-03-11 NOTE — Progress Notes (Signed)
L&D RN unable to trace FHR and UC with TOCO during labor due to pt movement and pt refusal of monitoring.  Shaw CNM at bedside at [phone].

## 2013-03-11 NOTE — Progress Notes (Signed)
Patient told by day shift RN to collect urine sample and call RN to send, patient re-reminded at 1515 and 1815 to collect urine sample and call RN to send as MD ordered this.

## 2013-03-11 NOTE — Progress Notes (Signed)
UR completed 

## 2013-03-11 NOTE — Progress Notes (Signed)
Infant not placed skin to skin within 5 min of delivery due to maternal condition and preference.  Mother refused to get out of hands and knees position after delivery, so infant was taken to warmer until placenta delivered, and mother was cleaned up.  Infant was placed skin to skin with mother at 0115 and remained skin to skin until 0205.   

## 2013-03-11 NOTE — Progress Notes (Deleted)
Post Partum Day 1 Subjective: no complaints, up ad lib, voiding, tolerating PO and + flatus She reports having some difficulty breast feeding.  Outpatient circumcision.  Objective: Blood pressure 116/62, pulse 91, temperature 98.9 F (37.2 C), temperature source Oral, resp. rate 20, last menstrual period 06/07/2012, unknown if currently breastfeeding.  Physical Exam:  General: alert, cooperative and appears stated age Lochia: appropriate Uterine Fundus: firmDVT Evaluation: No evidence of DVT seen on physical exam. Negative Homan's sign.   Recent Labs  03/11/13 0045  HGB 11.6*  HCT 35.3*    Assessment/Plan: Room in.   LOS: 1 day   Tishanna Dunford L 03/11/2013, 7:35 AM

## 2013-03-11 NOTE — H&P (Signed)
Chart reviewed and agree with management and plan.  

## 2013-03-12 LAB — RAPID URINE DRUG SCREEN, HOSP PERFORMED: Opiates: NOT DETECTED

## 2013-03-12 MED ORDER — IBUPROFEN 600 MG PO TABS: 600.0000 mg | ORAL_TABLET | Freq: Four times a day (QID) | ORAL | Status: DC

## 2013-03-12 NOTE — Progress Notes (Signed)
CSW has assessed MOB and spoke with DSS.  No barriers to discharge at this time.  Full consult report to follow.  [phone] 

## 2013-03-12 NOTE — Progress Notes (Signed)
Post Partum Day 1 Subjective: no complaints, up ad lib, voiding and tolerating PO, small lochia, plans to breastfeed, Nexplanon. Desires D/C but had inadequate tx of GBS, so peds won't release baby Objective: Blood pressure 122/78, pulse 73, temperature 98.7 F (37.1 C), temperature source Oral, resp. rate 18, last menstrual period 06/07/2012, SpO2 98.00%, unknown if currently breastfeeding.  Physical Exam:  General: alert, cooperative and no distress Lochia:normal flow Chest: CTAB Heart: RRR no m/r/g Abdomen: +BS, soft, nontender,  Uterine Fundus: firm DVT Evaluation: No evidence of DVT seen on physical exam. Extremities: no edema   Recent Labs  03/11/13 0045  HGB 11.6*  HCT 35.3*    Assessment/Plan: Plan for discharge tomorrow   LOS: 2 days   Moss,Lauren Lewin 03/12/2013, 7:29 AM
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
## 2013-03-13 NOTE — Discharge Summary (Signed)
Attestation of Attending Supervision of Advanced Practitioner: Evaluation and management procedures were performed by the PA/NP/CNM/OB Fellow under my supervision/collaboration. Chart reviewed and agree with management and plan.  Jasaiah Karwowski V 03/13/2013 12:18 PM

## 2013-03-13 NOTE — Progress Notes (Signed)
CSW spoke with DSS, MOB and infant okay to discharge with follow-up at home with MOB.  No barriers to discharge at this time.   [phone] 

## 2013-03-13 NOTE — Discharge Summary (Signed)
Obstetric Discharge Summary Reason for Admission: onset of labor Prenatal Procedures: none Intrapartum Procedures: spontaneous vaginal delivery Postpartum Procedures: none Complications-Operative and Postpartum: none Hemoglobin  Date Value Range Status  03/11/2013 11.6* 12.0 - 15.0 g/dL Final     HCT  Date Value Range Status  03/11/2013 35.3* 36.0 - 46.0 % Final    Physical Exam:  Please see progress note by Dr. Ike Bene this AM for exam. Per his report, uterus was firm, lochia was appropriate and no signs of DVT.   Discharge Diagnoses: Term Pregnancy-delivered  Discharge Information: Date: 03/13/2013 Activity: pelvic rest Diet: routine Medications: PNV and Ibuprofen Condition: stable Instructions: refer to practice specific booklet Discharge to: home Follow-up Information   Follow up with [facility]. Schedule an appointment as soon as possible for a visit in 4 weeks.   Contact information:   [address] [place] [address] [phone]      Newborn Data: Live born female  Birth Weight: 7 lb (3175 g) APGAR: 9, 9  Home with mother. Social work is involved and planning a home visit with them later this week.  Pt is breast feedingand desires a nexplanon which she will get at her postpartum visit.   Lauren Moss L 03/13/2013, 11:45 AM

## 2013-03-13 NOTE — Progress Notes (Addendum)
Post Partum Day 2 Of note pt was positive for cannibus on UDS. CSW request holding discharge until reevaluated by CSW.  Subjective: no complaints, up ad lib, voiding, tolerating PO and + flatus  Objective: Blood pressure 96/50, pulse 59, temperature 97.9 F (36.6 C), temperature source Oral, resp. rate 18, last menstrual period 06/07/2012, SpO2 98.00%, unknown if currently breastfeeding.  Physical Exam:  General: alert, cooperative, appears stated age and no distress Lochia: appropriate Uterine Fundus: Firm @ Umbilicus Incision: NA DVT Evaluation: No evidence of DVT seen on physical exam. Negative Homan's sign. No cords or calf tenderness. No significant calf/ankle edema. CTAB no w/r/c RRR no mgt Erythematous papular rash on bilateral cheeks, reports improving. Bilateral conjunctival hemorrages as well. Reports normal vision  Recent Labs  03/11/13 0045  HGB 11.6*  HCT 35.3*    Assessment/Plan: Discharge home today after discussion with CSW. Meeting milestones MOC: nexplanon MOF: bottle Pain: PO meds /motrin  Facial rash: discuss with attending for 2nd opinion. Improving per pt  LOS: 3 days   Lauren Moss, RYAN 03/13/2013, 6:46 AM

## 2013-04-15 ENCOUNTER — Ambulatory Visit: Payer: Medicaid Other | Admitting: Advanced Practice Midwife

## 2013-06-12 ENCOUNTER — Encounter (HOSPITAL_COMMUNITY): Payer: Self-pay | Admitting: Emergency Medicine

## 2013-06-12 ENCOUNTER — Emergency Department (HOSPITAL_COMMUNITY)
Admission: EM | Admit: 2013-06-12 | Discharge: 2013-06-12 | Disposition: A | Discharge status: Home / Self Care | Payer: Medicaid Other | Attending: Emergency Medicine | Admitting: Emergency Medicine

## 2013-06-12 DIAGNOSIS — Z87891 Personal history of nicotine dependence: Secondary | ICD-10-CM | POA: Insufficient documentation

## 2013-06-12 DIAGNOSIS — D649 Anemia, unspecified: Secondary | ICD-10-CM | POA: Insufficient documentation

## 2013-06-12 DIAGNOSIS — S01409A Unspecified open wound of unspecified cheek and temporomandibular area, initial encounter: Secondary | ICD-10-CM | POA: Insufficient documentation

## 2013-06-12 DIAGNOSIS — M795 Residual foreign body in soft tissue: Secondary | ICD-10-CM

## 2013-06-12 DIAGNOSIS — F319 Bipolar disorder, unspecified: Secondary | ICD-10-CM | POA: Insufficient documentation

## 2013-06-12 DIAGNOSIS — Z79899 Other long term (current) drug therapy: Secondary | ICD-10-CM | POA: Insufficient documentation

## 2013-06-12 DIAGNOSIS — R894 Abnormal immunological findings in specimens from other organs, systems and tissues: Secondary | ICD-10-CM | POA: Insufficient documentation

## 2013-06-12 MED ORDER — IBUPROFEN 800 MG PO TABS
800.0000 mg | ORAL_TABLET | Freq: Once | ORAL | Status: AC
  Administered 2013-06-12: 800 mg via ORAL
  Filled 2013-06-12: qty 1
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
## 2013-06-12 NOTE — ED Provider Notes (Signed)
Medical screening examination/treatment/procedure(s) were performed by non-physician practitioner and as supervising physician I was immediately available for consultation/collaboration.  EKG Interpretation   None         Madyn Ivins, MD 06/12/13 2235 

## 2014-02-06 ENCOUNTER — Encounter (HOSPITAL_COMMUNITY): Payer: Self-pay

## 2014-02-06 ENCOUNTER — Inpatient Hospital Stay (HOSPITAL_COMMUNITY)
Admission: AD | Admit: 2014-02-06 | Discharge: 2014-02-06 | Disposition: A | Discharge status: Home / Self Care | Payer: Medicaid Other | Source: Ambulatory Visit | Attending: Family Medicine | Admitting: Family Medicine

## 2014-02-06 DIAGNOSIS — Z87891 Personal history of nicotine dependence: Secondary | ICD-10-CM | POA: Insufficient documentation

## 2014-02-06 DIAGNOSIS — M545 Low back pain, unspecified: Secondary | ICD-10-CM | POA: Insufficient documentation

## 2014-02-06 DIAGNOSIS — B1910 Unspecified viral hepatitis B without hepatic coma: Secondary | ICD-10-CM | POA: Diagnosis not present

## 2014-02-06 DIAGNOSIS — O09899 Supervision of other high risk pregnancies, unspecified trimester: Secondary | ICD-10-CM

## 2014-02-06 DIAGNOSIS — O99891 Other specified diseases and conditions complicating pregnancy: Secondary | ICD-10-CM

## 2014-02-06 DIAGNOSIS — O9989 Other specified diseases and conditions complicating pregnancy, childbirth and the puerperium: Secondary | ICD-10-CM

## 2014-02-06 DIAGNOSIS — O212 Late vomiting of pregnancy: Secondary | ICD-10-CM | POA: Diagnosis not present

## 2014-02-06 DIAGNOSIS — F319 Bipolar disorder, unspecified: Secondary | ICD-10-CM | POA: Diagnosis not present

## 2014-02-06 DIAGNOSIS — R110 Nausea: Secondary | ICD-10-CM

## 2014-02-06 DIAGNOSIS — M549 Dorsalgia, unspecified: Secondary | ICD-10-CM

## 2014-02-06 DIAGNOSIS — J45909 Unspecified asthma, uncomplicated: Secondary | ICD-10-CM | POA: Insufficient documentation

## 2014-02-06 LAB — URINALYSIS, ROUTINE W REFLEX MICROSCOPIC
BILIRUBIN URINE: NEGATIVE
Glucose, UA: NEGATIVE mg/dL
Hgb urine dipstick: NEGATIVE
KETONES UR: NEGATIVE mg/dL
Leukocytes, UA: NEGATIVE
NITRITE: NEGATIVE
PROTEIN: NEGATIVE mg/dL
Specific Gravity, Urine: 1.02 (ref 1.005–1.030)
UROBILINOGEN UA: 0.2 mg/dL (ref 0.0–1.0)
pH: 6.5 (ref 5.0–8.0)

## 2014-02-06 LAB — GLUCOSE, CAPILLARY: GLUCOSE-CAPILLARY: 79 mg/dL (ref 70–99)

## 2014-02-06 MED ORDER — VITAMIN B-6 25 MG PO TABS: 25.0000 mg | ORAL_TABLET | Freq: Three times a day (TID) | ORAL | Status: DC | PRN

## 2014-02-06 MED ORDER — ALBUTEROL SULFATE HFA 108 (90 BASE) MCG/ACT IN AERS: 2.0000 | INHALATION_SPRAY | Freq: Four times a day (QID) | RESPIRATORY_TRACT | Status: DC | PRN

## 2014-02-06 MED ORDER — CYCLOBENZAPRINE HCL 5 MG PO TABS: 5.0000 mg | ORAL_TABLET | Freq: Three times a day (TID) | ORAL | Status: DC | PRN

## 2014-02-06 MED ORDER — ONDANSETRON 4 MG PO TBDP
4.0000 mg | ORAL_TABLET | Freq: Once | ORAL | Status: AC
Start: 2014-02-06 — End: 2014-02-06
  Administered 2014-02-06: 4 mg via ORAL
  Filled 2014-02-06: qty 1

## 2014-02-06 MED ORDER — ALBUTEROL SULFATE (2.5 MG/3ML) 0.083% IN NEBU: 2.5000 mg | INHALATION_SOLUTION | RESPIRATORY_TRACT | Status: DC | PRN

## 2014-02-06 NOTE — Discharge Instructions (Signed)
Discontinue albuterol nebulizer solution. Do not use albuterol nebulizer if you are using rescue inhaler at the same time. Use the albuterol rescue inhaler every 4 hours as needed for asthma exacerbation. See a general practitioner as soon as possible to have your asthma evaluated, and appropriate long term therapy initiate.

## 2014-02-06 NOTE — MAU Note (Signed)
Patient states she has had no prenatal care. States she has had vomiting since the beginning of the pregnancy. Has been having contractions for 3 days, has been before that off and on. Denies bleeding, leaking or discharge. States she has felt fetal movement only twice.

## 2014-02-06 NOTE — MAU Note (Addendum)
Patient states that she feels better and is resting quietly on her left side following administration of Zofran. Dr. Jaquita RectorMelancon into room and notified.

## 2014-02-06 NOTE — MAU Provider Note (Signed)
First Provider Initiated Contact with Patient 02/06/14 1150      Chief Complaint:  Emesis and Contractions   Lauren Moss is  23 y.o. [id number] at 4053w3d presents complaining of Emesis and Contractions .  She states none contractions are associated with none vaginal bleeding, intact membranes, along with active fetal movement. Pt. Is complaining of mild low back pain. No bleeding, LOF. + FM. She states hx of preterm birth in her first pregnancy, and GDM. [facility] but [facility]. She complains of ongoing nausea / periodic emesis with this pregnancy. She is also complaining of some mild wheezing 2/2 uncontrolled asthma for which she takes albuterol nebulizer at home. She has run out of her albuterol solution. She additionally, has some mild back pain, not associated with dysuria, polyuria, or hematuria. She has a hx of sciatica from her first pregnancy that has worsened with this visit. She denies fever, chills, chest pain, SOB. Her nausea is currently better. She has no other complaints.    Obstetrical/Gynecological History: OB History   Grav Para Term Preterm Abortions TAB SAB Ect Mult Living   6 3 2 1 2 1  0 1 0 3     Past Medical History: Past Medical History  Diagnosis Date  . Hepatitis B antibody positive   . Anemia   . Mental disorder     depression and bipolar  . Bipolar 1 disorder     hx cutting   . Chlamydia   . S/P dilation and curettage     for TAB and ectopic  . Asthma   . Heart murmur   . Depression   . Bipolar disorder   . PONV (postoperative nausea and vomiting)     Past Surgical History: Past Surgical History  Procedure Laterality Date  . Liver biopsy    . Small intestine surgery      pt had intusseseption at 23 years old  . Dilation and curettage of uterus    . Skin graft      Family History: Family History  Problem Relation Age of Onset  . Adopted: Yes    Social  History: History  Substance Use Topics  . Smoking status: Former Smoker    Quit date: 07/08/2012  . Smokeless tobacco: Never Used  . Alcohol Use: No    Allergies: No Known Allergies  Meds:  Prescriptions prior to admission  Medication Sig Dispense Refill  . Prenatal Vit-Fe Fumarate-FA (PRENATAL MULTIVITAMIN) TABS tablet Take 1 tablet by mouth daily at 12 noon.      . [DISCONTINUED] albuterol (PROVENTIL) (2.5 MG/3ML) 0.083% nebulizer solution Take 3 mLs (2.5 mg total) by nebulization every 4 (four) hours as needed for wheezing.  75 mL  12    Review of Systems -  per HPI above.    Physical Exam  Blood pressure 126/66, pulse 83, temperature 98.1 F (36.7 C), temperature source Oral, resp. rate 18, height 5' 3.5" (1.613 m), weight 91.989 kg (202 lb 12.8 oz), last menstrual period 08/19/2013, SpO2 100.00%, unknown if currently breastfeeding. GENERAL: Well-developed, well-nourished female in no acute distress.  LUNGS: Clear to auscultation bilaterally.  HEART: Regular rate and rhythm. ABDOMEN: Soft, nontender, nondistended, gravid. Abdomen appropriate for gestational age.  EXTREMITIES: Nontender, no edema, 2+ distal pulses. DTR's 2+ CERVICAL EXAM: Dilatation 0cm   Effacement 0%   Station High Presentation: Unable to detect.  FHT:  Baseline rate 120'sbpm   Variability  moderate  Accelerations present   Decelerations none Contractions None   Labs: Results for orders placed during the hospital encounter of 02/06/14 (from the past 24 hour(s))  URINALYSIS, ROUTINE W REFLEX MICROSCOPIC   Collection Time    02/06/14 10:40 AM      Result Value Ref Range   Color, Urine YELLOW  YELLOW   APPearance CLEAR  CLEAR   Specific Gravity, Urine 1.020  1.005 - 1.030   pH 6.5  5.0 - 8.0   Glucose, UA NEGATIVE  NEGATIVE mg/dL   Hgb urine dipstick NEGATIVE  NEGATIVE   Bilirubin Urine NEGATIVE  NEGATIVE   Ketones, ur NEGATIVE  NEGATIVE mg/dL   Protein, ur NEGATIVE  NEGATIVE mg/dL   Urobilinogen,  UA 0.2  0.0 - 1.0 mg/dL   Nitrite NEGATIVE  NEGATIVE   Leukocytes, UA NEGATIVE  NEGATIVE   Imaging Studies:  No results found.  Assessment: Lauren BallerDanielle T Moss is  23 y.o. [phone] at 701w3d presents [facility], low back pain consistent with hx of sciatica, nausea, and needs albuterol refill.   Plan: 1. [facility] to low risk clinic to establish care. Referral for initial prenatal ultrasound.  2. Flexeril for low back pain.  3. Advised of diet modifications for nausea, prescription for pyridoxine prn as well.  4. Prescribed albuterol inhaler for asthma symptoms. Advised pt. Of need to establish care with general practitioner for management of asthma.  5. Return to the ER if you have any acute concerns or complaints.   Thank you for letting us take care of you!  Melancon, Hillery HunterCaleb G 7/6/201512:26 PM  Evaluation and management procedures were performed by Resident physician under my supervision/collaboration. Chart reviewed, patient examined by me and I agree with management and plan.

## 2014-02-07 NOTE — MAU Provider Note (Signed)
Attestation of Attending Supervision of Advanced Practitioner (PA/CNM/NP): Evaluation and management procedures were performed by the Advanced Practitioner under my supervision and collaboration.  I have reviewed the Advanced Practitioner's note and chart, and I agree with the management and plan.  Thang Flett S, MD [facility] Attending 02/07/2014 8:26 AM   

## 2014-02-27 ENCOUNTER — Encounter: Payer: Medicaid Other | Admitting: Obstetrics and Gynecology

## 2014-06-05 ENCOUNTER — Encounter (HOSPITAL_COMMUNITY): Payer: Self-pay

## 2014-12-12 ENCOUNTER — Encounter (HOSPITAL_COMMUNITY): Payer: Self-pay | Admitting: *Deleted

## 2016-03-03 ENCOUNTER — Encounter (HOSPITAL_COMMUNITY): Payer: Self-pay | Admitting: *Deleted

## 2016-03-03 ENCOUNTER — Inpatient Hospital Stay (HOSPITAL_COMMUNITY)
Admission: AD | Admit: 2016-03-03 | Discharge: 2016-03-03 | Disposition: A | Discharge status: Home / Self Care | Payer: Medicaid Other | Source: Ambulatory Visit | Attending: Obstetrics and Gynecology | Admitting: Obstetrics and Gynecology

## 2016-03-03 DIAGNOSIS — O219 Vomiting of pregnancy, unspecified: Secondary | ICD-10-CM | POA: Insufficient documentation

## 2016-03-03 DIAGNOSIS — Z87891 Personal history of nicotine dependence: Secondary | ICD-10-CM | POA: Diagnosis not present

## 2016-03-03 DIAGNOSIS — Z9889 Other specified postprocedural states: Secondary | ICD-10-CM | POA: Diagnosis not present

## 2016-03-03 DIAGNOSIS — O26892 Other specified pregnancy related conditions, second trimester: Secondary | ICD-10-CM | POA: Insufficient documentation

## 2016-03-03 DIAGNOSIS — Z79899 Other long term (current) drug therapy: Secondary | ICD-10-CM | POA: Insufficient documentation

## 2016-03-03 DIAGNOSIS — O99512 Diseases of the respiratory system complicating pregnancy, second trimester: Secondary | ICD-10-CM | POA: Insufficient documentation

## 2016-03-03 DIAGNOSIS — Z3A14 14 weeks gestation of pregnancy: Secondary | ICD-10-CM | POA: Insufficient documentation

## 2016-03-03 DIAGNOSIS — A5901 Trichomonal vulvovaginitis: Secondary | ICD-10-CM

## 2016-03-03 DIAGNOSIS — O23591 Infection of other part of genital tract in pregnancy, first trimester: Secondary | ICD-10-CM

## 2016-03-03 DIAGNOSIS — M549 Dorsalgia, unspecified: Secondary | ICD-10-CM | POA: Insufficient documentation

## 2016-03-03 DIAGNOSIS — O99342 Other mental disorders complicating pregnancy, second trimester: Secondary | ICD-10-CM | POA: Diagnosis not present

## 2016-03-03 LAB — URINALYSIS, ROUTINE W REFLEX MICROSCOPIC
Glucose, UA: NEGATIVE mg/dL
Hgb urine dipstick: NEGATIVE
Ketones, ur: >80 mg/dL — AB
NITRITE: NEGATIVE
Protein, ur: 100 mg/dL — AB
Specific Gravity, Urine: 1.02 (ref 1.005–1.030)
pH: 6.5 (ref 5.0–8.0)

## 2016-03-03 LAB — URINE MICROSCOPIC-ADD ON

## 2016-03-03 LAB — POCT PREGNANCY, URINE: PREG TEST UR: POSITIVE — AB

## 2016-03-03 MED ORDER — PROMETHAZINE HCL 25 MG PO TABS
25.0000 mg | ORAL_TABLET | Freq: Once | ORAL | Status: AC
  Administered 2016-03-03: 25 mg via ORAL
  Filled 2016-03-03: qty 1

## 2016-03-03 MED ORDER — PROMETHAZINE HCL 25 MG PO TABS: 25.0000 mg | ORAL_TABLET | Freq: Once | ORAL | 0 refills | Status: DC

## 2016-03-03 MED ORDER — METRONIDAZOLE 500 MG PO TABS
2000.0000 mg | ORAL_TABLET | Freq: Once | ORAL | 0 refills | Status: AC
Start: 2016-03-03 — End: 2016-03-03
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
## 2016-03-03 NOTE — Discharge Instructions (Signed)
Morning Sickness °Morning sickness is when you feel sick to your stomach (nauseous) during pregnancy. This nauseous feeling may or may not come with vomiting. It often occurs in the morning but can be a problem any time of day. Morning sickness is most common during the first trimester, but it may continue throughout pregnancy. While morning sickness is unpleasant, it is usually harmless unless you develop severe and continual vomiting (hyperemesis gravidarum). This condition requires more intense treatment.  °CAUSES  °The cause of morning sickness is not completely known but seems to be related to normal hormonal changes that occur in pregnancy. °RISK FACTORS °You are at greater risk if you: °· Experienced nausea or vomiting before your pregnancy. °· Had morning sickness during a previous pregnancy. °· Are pregnant with more than one baby, such as twins. °TREATMENT  °Do not use any medicines (prescription, over-the-counter, or herbal) for morning sickness without first talking to your health care provider. Your health care provider may prescribe or recommend: °· Vitamin B6 supplements. °· Anti-nausea medicines. °· The herbal medicine ginger. °HOME CARE INSTRUCTIONS  °· Only take over-the-counter or prescription medicines as directed by your health care provider. °· Taking multivitamins before getting pregnant can prevent or decrease the severity of morning sickness in most women. °· Eat a piece of dry toast or unsalted crackers before getting out of bed in the morning. °· Eat five or six small meals a day. °· Eat dry and bland foods (rice, baked potato). Foods high in carbohydrates are often helpful. °· Do not drink liquids with your meals. Drink liquids between meals. °· Avoid greasy, fatty, and spicy foods. °· Get someone to cook for you if the smell of any food causes nausea and vomiting. °· If you feel nauseous after taking prenatal vitamins, take the vitamins at night or with a snack.  °· Snack on protein  foods (nuts, yogurt, cheese) between meals if you are hungry. °· Eat unsweetened gelatins for desserts. °· Wearing an acupressure wristband (worn for sea sickness) may be helpful. °· Acupuncture may be helpful. °· Do not smoke. °· Get a humidifier to keep the air in your house free of odors. °· Get plenty of fresh air. °SEEK MEDICAL CARE IF:  °· Your home remedies are not working, and you need medicine. °· You feel dizzy or lightheaded. °· You are losing weight. °SEEK IMMEDIATE MEDICAL CARE IF:  °· You have persistent and uncontrolled nausea and vomiting. °· You pass out (faint). °MAKE SURE YOU: °· Understand these instructions. °· Will watch your condition. °· Will get help right away if you are not doing well or get worse. °  °This information is not intended to replace advice given to you by your health care provider. Make sure you discuss any questions you have with your health care provider. °  °Document Released: 09/11/2006 Document Revised: 07/26/2013 Document Reviewed: 01/05/2013 °Elsevier Interactive Patient Education ©2016 Elsevier Inc. ° °Trichomoniasis °Trichomoniasis is an infection caused by an organism called Trichomonas. The infection can affect both women and men. In women, the outer female genitalia and the vagina are affected. In men, the penis is mainly affected, but the prostate and other reproductive organs can also be involved. Trichomoniasis is a sexually transmitted infection (STI) and is most often passed to another person through sexual contact.  °RISK FACTORS °· Having unprotected sexual intercourse. °· Having sexual intercourse with an infected partner. °SIGNS AND SYMPTOMS  °Symptoms of trichomoniasis in women include: °· Abnormal gray-green frothy vaginal discharge. °·   Itching and irritation of the vagina. °· Itching and irritation of the area outside the vagina. °Symptoms of trichomoniasis in men include:  °· Penile discharge with or without pain. °· Pain during urination. This results  from inflammation of the urethra. °DIAGNOSIS  °Trichomoniasis may be found during a Pap test or physical exam. Your health care provider may use one of the following methods to help diagnose this infection: °· Testing the pH of the vagina with a test tape. °· Using a vaginal swab test that checks for the Trichomonas organism. A test is available that provides results within a few minutes. °· Examining a urine sample. °· Testing vaginal secretions. °Your health care provider may test you for other STIs, including HIV. °TREATMENT  °· You may be given medicine to fight the infection. Women should inform their health care provider if they could be or are pregnant. Some medicines used to treat the infection should not be taken during pregnancy. °· Your health care provider may recommend over-the-counter medicines or creams to decrease itching or irritation. °· Your sexual partner will need to be treated if infected. °· Your health care provider may test you for infection again 3 months after treatment. °HOME CARE INSTRUCTIONS  °· Take medicines only as directed by your health care provider. °· Take over-the-counter medicine for itching or irritation as directed by your health care provider. °· Do not have sexual intercourse while you have the infection. °· Women should not douche or wear tampons while they have the infection. °· Discuss your infection with your partner. Your partner may have gotten the infection from you, or you may have gotten it from your partner. °· Have your sex partner get examined and treated if necessary. °· Practice safe, informed, and protected sex. °· See your health care provider for other STI testing. °SEEK MEDICAL CARE IF:  °· You still have symptoms after you finish your medicine. °· You develop abdominal pain. °· You have pain when you urinate. °· You have bleeding after sexual intercourse. °· You develop a rash. °· Your medicine makes you sick or makes you throw up (vomit). °MAKE SURE  YOU: °· Understand these instructions. °· Will watch your condition. °· Will get help right away if you are not doing well or get worse. °  °This information is not intended to replace advice given to you by your health care provider. Make sure you discuss any questions you have with your health care provider. °  °Document Released: 01/14/2001 Document Revised: 08/11/2014 Document Reviewed: 05/02/2013 °Elsevier Interactive Patient Education ©2016 Elsevier Inc. ° °Expedited Partner Therapy:  °Information Sheet for Patients and Partners  °            ° °You have been offered expedited partner therapy (EPT). This information sheet contains important information and warnings you need to be aware of, so please read it carefully.  ° °Expedited Partner Therapy (EPT) is the clinical practice of treating the sexual partners of persons who receive chlamydia, gonorrhea, or trichomoniasis diagnoses by providing medications or prescriptions to the patient. Patients then provide partners with these therapies without the health-care provider having examined the partner. In other words, EPT is a convenient, fast and private way for patients to help their sexual partners get treated.  ° °Chlamydia and gonorrhea are bacterial infections you get from having sex with a person who is already infected. Trichomoniasis (or “trich”) is a very common sexually transmitted infection (STI) that is caused by infection with a protozoan   parasite called Trichomonas vaginalis.  Many people with these infections don’t know it because they feel fine, but without treatment these infections can cause serious health problems, such as pelvic inflammatory disease, ectopic pregnancy, infertility and increased risk of HIV.  ° °It is important to get treated as soon as possible to protect your health, to avoid spreading these infections to others, and to prevent yourself from becoming re-infected. The good news is these infections can be easily cured with  proper antibiotic medicine. The best way to take care of your self is to see a doctor or go to your local health department. If you are not able to see a doctor or other medical provider, you should take EPT.  ° ° °Recommended Medication: °EPT for Chlamydia:  Azithromycin (Zithromax) 1 gram orally in a single dose °EPT for Gonorrhea:  Cefixime (Suprax) 400 milligrams orally in a single dose PLUS azithromycin (Zithromax) 1 gram orally in a single dose °EPT for Trichomoniasis:  Metronidazole (Flagyl) 2 grams orally in a single dose ° ° °These medicines are very safe. However, you should not take them if you have ever had an allergic reaction (like a rash) to any of these medicines: azithromycin (Zithromax), erythromycin, clarithromycin (Biaxin), metronidazole (Flagyl), tinidazole (Tindimax). If you are uncertain about whether you have an allergy, call your medical provider or pharmacist before taking this medicine. If you have a serious, long-term illness like kidney, liver or heart disease, colitis or stomach problems, or you are currently taking other prescription medication, talk to your provider before taking this medication.  ° °Women: If you have lower belly pain, pain during sex, vomiting, or a fever, do not take this medicine. Instead, you should see a medical provider to be certain you do not have pelvic inflammatory disease (PID). PID can be serious and lead to infertility, pregnancy problems or chronic pelvic pain.  ° °Pregnant Women: It is very important for you to see a doctor to get pregnancy services and pre-natal care. These antibiotics for EPT are safe for pregnant women, but you still need to see a medical provider as soon as possible. It is also important to note that Doxycycline is an alternative therapy for chlamydia, but it should not be taken by someone who is pregnant.  ° °Men: If you have pain or swelling in the testicles or a fever, do not take this medicine and see a medical provider.     ° °Men who have sex with men (MSM): MSM in [place] continue to experience high rates of syphilis and HIV. Many MSM with gonorrhea or chlamydia could also have syphilis and/or HIV and not know it. If you are a man who has sex with other men, it is very important that you see a medical provider and are tested for HIV and syphilis. EPT is not recommended for gonorrhea for MSM.  Recommended treatment for gonorrhea for MSM is Rocephin (shot) AND azithromycin due to decreased cure rate.  Please see your medical provider if this is the case.   ° °Along with this information sheet is a prescription for the medicine. If you receive a prescription it will be in your name and will indicate your date of birth, or it will be in the name of “Expedited Partner Therapy”.   In either case, you can have the prescription filled at a pharmacy. You will be responsible for the cost of the medicine, unless you have prescription drug coverage. In that case, you could provide your name   so the pharmacy could bill your health plan.  ° °Take the medication as directed. Some people will have a mild, upset stomach, which does not last long. AVOID alcohol 24 hours after taking metronidazole (Flagyl) to reduce the possibility of a disulfiram-like reaction (severe vomiting and abdominal pain).  After taking the medicine, do not have sex for 7 days. Do not share this medicine or give it to anyone else. It is important to tell everyone you have had sex with in the last 60 days that they need to go and get tested for sexually transmitted infections.  ° °Ways to prevent these and other sexually transmitted infections (STIs):  ° °• Abstain from sex. This is the only sure way to avoid getting an STI.  °• Use barrier methods, such as condoms, consistently and correctly.  °• Limit the number of sexual partners.  °• Have regular physical exams, including testing for STIs.  ° °For more information about EPT or other issues pertaining to an STI,  please contact your medical provider or the [place] [facility] at [phone] or [email or web address]   ° °

## 2016-03-03 NOTE — MAU Note (Signed)
Severe pain started last night, shooting down her rt  leg. Happened about a wk ago, was the first time Also having severe vomiting, can't keep anything down- even water.

## 2016-03-24 ENCOUNTER — Other Ambulatory Visit (HOSPITAL_COMMUNITY)
Admission: RE | Admit: 2016-03-24 | Discharge: 2016-03-24 | Disposition: A | Discharge status: Home / Self Care | Payer: Medicaid Other | Source: Ambulatory Visit | Attending: Medical | Admitting: Medical

## 2016-03-24 ENCOUNTER — Encounter: Payer: Self-pay | Admitting: Medical

## 2016-03-24 ENCOUNTER — Ambulatory Visit (INDEPENDENT_AMBULATORY_CARE_PROVIDER_SITE_OTHER): Payer: Medicaid Other | Admitting: Medical

## 2016-03-24 VITALS — BP 132/77 | HR 97 | Wt 201.8 lb

## 2016-03-24 DIAGNOSIS — O26892 Other specified pregnancy related conditions, second trimester: Secondary | ICD-10-CM | POA: Diagnosis present

## 2016-03-24 DIAGNOSIS — O219 Vomiting of pregnancy, unspecified: Secondary | ICD-10-CM

## 2016-03-24 DIAGNOSIS — Z113 Encounter for screening for infections with a predominantly sexual mode of transmission: Secondary | ICD-10-CM | POA: Insufficient documentation

## 2016-03-24 DIAGNOSIS — N898 Other specified noninflammatory disorders of vagina: Secondary | ICD-10-CM

## 2016-03-24 DIAGNOSIS — O0992 Supervision of high risk pregnancy, unspecified, second trimester: Secondary | ICD-10-CM

## 2016-03-24 LAB — POCT URINALYSIS DIP (DEVICE)
Glucose, UA: NEGATIVE mg/dL
KETONES UR: NEGATIVE mg/dL
Nitrite: NEGATIVE
PH: 6.5 (ref 5.0–8.0)
Protein, ur: 30 mg/dL — AB
SPECIFIC GRAVITY, URINE: 1.02 (ref 1.005–1.030)
Urobilinogen, UA: 1 mg/dL (ref 0.0–1.0)

## 2016-03-24 MED ORDER — ONDANSETRON HCL 4 MG PO TABS: 4.0000 mg | ORAL_TABLET | Freq: Four times a day (QID) | ORAL | 0 refills | Status: DC

## 2016-03-24 MED ORDER — METRONIDAZOLE 500 MG PO TABS: 2000.0000 mg | ORAL_TABLET | Freq: Once | ORAL | 0 refills | Status: AC

## 2016-03-24 NOTE — Progress Notes (Signed)
Subjective:  Lauren Moss is a 25 y.o. [id number] at 6946w6d being seen today for initial prenatal care visit.  She is currently monitored for the following issues for this high-risk pregnancy and has Depression; Bipolar disorder (HCC); Hep B complicating pregnancy; History of gestational diabetes in prior pregnancy, currently pregnant in second trimester; and Supervision of high risk pregnancy in second trimester on her problem list.  Patient reports no complaints.  Contractions: Not present. Vag. Bleeding: None.  Movement: Present. Denies leaking of fluid.   The following portions of the patient's history were reviewed and updated as appropriate: allergies, current medications, past family history, past medical history, past social history, past surgical history and problem list. Problem list updated.  Objective:   Vitals:   03/24/16 1542  BP: 132/77  Pulse: 97  Weight: 201 lb 12.8 oz (91.5 kg)    Fetal Status: Fetal Heart Rate (bpm): 142   Movement: Present     General:  Alert, oriented and cooperative. Patient is in no acute distress.  Skin: Skin is warm and dry. No rash noted.   Cardiovascular: Normal heart rate noted  Respiratory: Normal respiratory effort, no problems with respiration noted  Abdomen: Soft, gravid, appropriate for gestational age. Pain/Pressure: Absent     Pelvic:  Cervical exam deferred        Extremities: Normal range of motion.  Edema: None  Mental Status: Normal mood and affect. Normal behavior. Normal judgment and thought content.   Urinalysis: Urine Protein: 1+ Urine Glucose: Negative  Assessment and Plan:  Pregnancy: [id number] at 2746w6d  1. Supervision of high risk pregnancy in second trimester - US MFM OB COMP + 14 WK; scheduled - Glucose Tolerance, 1 HR (50g) - Prenatal Profile - Hemoglobinopathy Evaluation - GC/Chlamydia probe amp ([place])not at [facility] - Culture, OB Urine - Pain Mgmt, Profile 6 Conf w/o mM, U - Pap results from last year  requested from Dr. Shawnie Ponsorn  2. Vaginal discharge during pregnancy in second trimester - metroNIDAZOLE (FLAGYL) 500 MG tablet; Take 4 tablets (2,000 mg total) by mouth once.  Dispense: 4 tablet; Refill: 0  3. Nausea and vomiting in pregnancy prior to [redacted] weeks gestation - Phenergan not helping - ondansetron (ZOFRAN) 4 MG tablet; Take 1 tablet (4 mg total) by mouth every 6 (six) hours.  Dispense: 20 tablet; Refill: 0  second trimester warning signs/ symptoms and general obstetric precautions including but not limited to vaginal bleeding, contractions, leaking of fluid and fetal movement were reviewed in detail with the patient. Please refer to After Visit Summary for other counseling recommendations.  Return in about 4 weeks (around 04/21/2016) for HROB.   Marny LowensteinJulie N Koal Eslinger, PA-C

## 2016-03-24 NOTE — Patient Instructions (Signed)
Second Trimester of Pregnancy  The second trimester is from week 13 through week 28, month 4 through 6. This is often the time in pregnancy that you feel your best. Often times, morning sickness has lessened or quit. You may have more energy, and you may get hungry more often. Your unborn baby (fetus) is growing rapidly. At the end of the sixth month, he or she is about 9 inches long and weighs about 1½ pounds. You will likely feel the baby move (quickening) between 18 and 20 weeks of pregnancy.  HOME CARE   · Avoid all smoking, herbs, and alcohol. Avoid drugs not approved by your doctor.  · Do not use any tobacco products, including cigarettes, chewing tobacco, and electronic cigarettes. If you need help quitting, ask your doctor. You may get counseling or other support to help you quit.  · Only take medicine as told by your doctor. Some medicines are safe and some are not during pregnancy.  · Exercise only as told by your doctor. Stop exercising if you start having cramps.  · Eat regular, healthy meals.  · Wear a good support bra if your breasts are tender.  · Do not use hot tubs, steam rooms, or saunas.  · Wear your seat belt when driving.  · Avoid raw meat, uncooked cheese, and liter boxes and soil used by cats.  · Take your prenatal vitamins.  · Take 1500-2000 milligrams of calcium daily starting at the 20th week of pregnancy until you deliver your baby.  · Try taking medicine that helps you poop (stool softener) as needed, and if your doctor approves. Eat more fiber by eating fresh fruit, vegetables, and whole grains. Drink enough fluids to keep your pee (urine) clear or pale yellow.  · Take warm water baths (sitz baths) to soothe pain or discomfort caused by hemorrhoids. Use hemorrhoid cream if your doctor approves.  · If you have puffy, bulging veins (varicose veins), wear support hose. Raise (elevate) your feet for 15 minutes, 3-4 times a day. Limit salt in your diet.  · Avoid heavy lifting, wear low heals,  and sit up straight.  · Rest with your legs raised if you have leg cramps or low back pain.  · Visit your dentist if you have not gone during your pregnancy. Use a soft toothbrush to brush your teeth. Be gentle when you floss.  · You can have sex (intercourse) unless your doctor tells you not to.  · Go to your doctor visits.  GET HELP IF:   · You feel dizzy.  · You have mild cramps or pressure in your lower belly (abdomen).  · You have a nagging pain in your belly area.  · You continue to feel sick to your stomach (nauseous), throw up (vomit), or have watery poop (diarrhea).  · You have bad smelling fluid coming from your vagina.  · You have pain with peeing (urination).  GET HELP RIGHT AWAY IF:   · You have a fever.  · You are leaking fluid from your vagina.  · You have spotting or bleeding from your vagina.  · You have severe belly cramping or pain.  · You lose or gain weight rapidly.  · You have trouble catching your breath and have chest pain.  · You notice sudden or extreme puffiness (swelling) of your face, hands, ankles, feet, or legs.  · You have not felt the baby move in over an hour.  · You have severe headaches that do   not go away with medicine.  · You have vision changes.     This information is not intended to replace advice given to you by your health care provider. Make sure you discuss any questions you have with your health care provider.     Document Released: 10/15/2009 Document Revised: 08/11/2014 Document Reviewed: 09/21/2012  Elsevier Interactive Patient Education ©2016 Elsevier Inc.

## 2016-03-25 LAB — PRENATAL PROFILE (SOLSTAS)
Antibody Screen: NEGATIVE
BASOS PCT: 0 %
Basophils Absolute: 0 cells/uL (ref 0–200)
EOS ABS: 0 {cells}/uL — AB (ref 15–500)
Eosinophils Relative: 0 %
HCT: 34.5 % — ABNORMAL LOW (ref 35.0–45.0)
HEP B S AG: POSITIVE — AB
HIV: NONREACTIVE
Hemoglobin: 11.8 g/dL (ref 11.7–15.5)
LYMPHS PCT: 35 %
Lymphs Abs: 1610 cells/uL (ref 850–3900)
MCH: 30 pg (ref 27.0–33.0)
MCHC: 34.2 g/dL (ref 32.0–36.0)
MCV: 87.8 fL (ref 80.0–100.0)
MONO ABS: 368 {cells}/uL (ref 200–950)
MPV: 10.4 fL (ref 7.5–12.5)
Monocytes Relative: 8 %
NEUTROS PCT: 57 %
Neutro Abs: 2622 cells/uL (ref 1500–7800)
PLATELETS: 212 10*3/uL (ref 140–400)
RBC: 3.93 MIL/uL (ref 3.80–5.10)
RDW: 13.1 % (ref 11.0–15.0)
RH TYPE: POSITIVE
Rubella: <0.9 Index (ref <0.90)
WBC: 4.6 10*3/uL (ref 3.8–10.8)

## 2016-03-25 LAB — GLUCOSE TOLERANCE, 1 HOUR (50G) W/O FASTING: Glucose, 1 Hr, gestational: 111 mg/dL (ref <140)

## 2016-03-25 LAB — GC/CHLAMYDIA PROBE AMP (~~LOC~~) NOT AT ARMC
CHLAMYDIA, DNA PROBE: NEGATIVE
Neisseria Gonorrhea: NEGATIVE

## 2016-03-25 LAB — HEPATITIS B SURF AG CONFIRMATION: Hepatitis B Surf Ag Confirmation: POSITIVE — AB

## 2016-03-25 LAB — CULTURE, OB URINE: Organism ID, Bacteria: NO GROWTH

## 2016-03-26 ENCOUNTER — Encounter (HOSPITAL_COMMUNITY): Payer: Self-pay | Admitting: Medical

## 2016-03-26 LAB — HEMOGLOBINOPATHY EVALUATION
HCT: 34.5 % — ABNORMAL LOW (ref 35.0–45.0)
HEMOGLOBIN: 11.8 g/dL (ref 11.7–15.5)
Hgb A2 Quant: 2.7 % (ref 1.8–3.5)
Hgb A: 96.3 % (ref >96.0)
MCH: 30 pg (ref 27.0–33.0)
MCV: 87.8 fL (ref 80.0–100.0)
RBC: 3.93 MIL/uL (ref 3.80–5.10)
RDW: 13.1 % (ref 11.0–15.0)

## 2016-03-28 ENCOUNTER — Encounter: Payer: Self-pay | Admitting: Medical

## 2016-03-28 DIAGNOSIS — F1290 Cannabis use, unspecified, uncomplicated: Secondary | ICD-10-CM | POA: Insufficient documentation

## 2016-03-28 LAB — PAIN MGMT, PROFILE 6 CONF W/O MM, U
6 ACETYLMORPHINE: NEGATIVE ng/mL (ref <10)
Alcohol Metabolites: NEGATIVE ng/mL (ref <500)
Amphetamines: NEGATIVE ng/mL (ref <500)
BARBITURATES: NEGATIVE ng/mL (ref <300)
Benzodiazepines: NEGATIVE ng/mL (ref <100)
Cocaine Metabolite: NEGATIVE ng/mL (ref <150)
Creatinine: 336.9 mg/dL (ref >=20.0)
METHADONE METABOLITE: NEGATIVE ng/mL (ref <100)
Marijuana Metabolite: 249 ng/mL — ABNORMAL HIGH (ref <5)
Marijuana Metabolite: POSITIVE ng/mL — AB (ref <20)
Opiates: NEGATIVE ng/mL (ref <100)
Oxidant: NEGATIVE ug/mL (ref <200)
Oxycodone: NEGATIVE ng/mL (ref <100)
PHENCYCLIDINE: NEGATIVE ng/mL (ref <25)
PLEASE NOTE: 0
pH: 7.14 (ref 4.5–9.0)

## 2016-04-02 ENCOUNTER — Encounter (HOSPITAL_COMMUNITY): Payer: Self-pay

## 2016-04-02 ENCOUNTER — Other Ambulatory Visit: Payer: Self-pay | Admitting: Medical

## 2016-04-02 ENCOUNTER — Ambulatory Visit (HOSPITAL_COMMUNITY)
Admission: RE | Admit: 2016-04-02 | Discharge: 2016-04-02 | Disposition: A | Discharge status: Home / Self Care | Payer: Medicaid Other | Source: Ambulatory Visit | Attending: Medical | Admitting: Medical

## 2016-04-02 DIAGNOSIS — O09292 Supervision of pregnancy with other poor reproductive or obstetric history, second trimester: Secondary | ICD-10-CM

## 2016-04-02 DIAGNOSIS — O09892 Supervision of other high risk pregnancies, second trimester: Secondary | ICD-10-CM

## 2016-04-02 DIAGNOSIS — Z3A19 19 weeks gestation of pregnancy: Secondary | ICD-10-CM

## 2016-04-02 DIAGNOSIS — Z36 Encounter for antenatal screening of mother: Secondary | ICD-10-CM | POA: Insufficient documentation

## 2016-04-02 DIAGNOSIS — Z1389 Encounter for screening for other disorder: Secondary | ICD-10-CM

## 2016-04-02 DIAGNOSIS — Z8632 Personal history of gestational diabetes: Secondary | ICD-10-CM

## 2016-04-02 DIAGNOSIS — O0992 Supervision of high risk pregnancy, unspecified, second trimester: Secondary | ICD-10-CM

## 2016-04-02 DIAGNOSIS — O09212 Supervision of pregnancy with history of pre-term labor, second trimester: Secondary | ICD-10-CM | POA: Insufficient documentation

## 2016-04-03 ENCOUNTER — Other Ambulatory Visit (HOSPITAL_COMMUNITY): Payer: Self-pay | Admitting: *Deleted

## 2016-04-03 DIAGNOSIS — O358XX0 Maternal care for other (suspected) fetal abnormality and damage, not applicable or unspecified: Secondary | ICD-10-CM

## 2016-04-03 DIAGNOSIS — O35EXX0 Maternal care for other (suspected) fetal abnormality and damage, fetal genitourinary anomalies, not applicable or unspecified: Secondary | ICD-10-CM

## 2016-04-24 ENCOUNTER — Encounter: Payer: Medicaid Other | Admitting: Obstetrics and Gynecology

## 2016-05-08 ENCOUNTER — Encounter: Payer: Self-pay | Admitting: Obstetrics and Gynecology

## 2016-05-08 ENCOUNTER — Encounter: Payer: Medicaid Other | Admitting: Obstetrics and Gynecology

## 2016-05-08 NOTE — Progress Notes (Signed)
Patient did not keep OB appointment for 05/08/2016.  Lauren Moss, Jr MD Attending [facility] Mid[facility](Faculty Practice)

## 2016-06-04 ENCOUNTER — Encounter (HOSPITAL_COMMUNITY): Payer: Self-pay

## 2016-06-04 ENCOUNTER — Ambulatory Visit (HOSPITAL_COMMUNITY)
Admission: RE | Admit: 2016-06-04 | Discharge: 2016-06-04 | Disposition: A | Discharge status: Home / Self Care | Payer: Medicaid Other | Source: Ambulatory Visit | Attending: Medical | Admitting: Medical

## 2016-08-04 NOTE — L&D Delivery Note (Signed)
26 y.o. [id number] at 3261w5d delivered a viable female infant in cephalic, OA position. No nuchal cord. Right anterior shoulder delivered with ease. 60 sec delayed cord clamping. Cord clamped x2 and cut. Placenta delivered spontaneously intact, with 3VC. Fundus firm on exam with massage and pitocin. Good hemostasis noted.  Anesthesia: Epidural Laceration: None Suture: N/A Good hemostasis noted. EBL: 200 cc  Mom and baby recovering in LDR.    Apgars: APGAR (1 MIN): 8   APGAR (5 MINS): 9   Weight: Pending skin to skin  Patient is hepatitis B+, water broke shortly before delivery (about 30 min), clear fluid.  It was noted that patient initial OB ultrasound at 17 weeks showed signs of fetal pyelectasis, patient never followed up for any further visits with OB provider for repeat US.     Jen MowElizabeth Mumaw, DO OB Fellow [facility], [facility] 08/24/2016, 1:59 AM

## 2016-08-23 ENCOUNTER — Inpatient Hospital Stay (HOSPITAL_COMMUNITY): Payer: Medicaid Other | Admitting: Anesthesiology

## 2016-08-23 ENCOUNTER — Encounter (HOSPITAL_COMMUNITY): Payer: Self-pay | Admitting: General Practice

## 2016-08-23 ENCOUNTER — Inpatient Hospital Stay (HOSPITAL_COMMUNITY)
Admission: AD | Admit: 2016-08-23 | Discharge: 2016-08-26 | DRG: 775 | Disposition: A | Discharge status: Home / Self Care | Payer: Medicaid Other | Source: Ambulatory Visit | Attending: Obstetrics and Gynecology | Admitting: Obstetrics and Gynecology

## 2016-08-23 ENCOUNTER — Encounter (HOSPITAL_COMMUNITY): Payer: Self-pay | Admitting: *Deleted

## 2016-08-23 ENCOUNTER — Inpatient Hospital Stay (HOSPITAL_COMMUNITY)
Admission: AD | Admit: 2016-08-23 | Discharge: 2016-08-23 | Disposition: A | Discharge status: Home / Self Care | Payer: Medicaid Other | Source: Ambulatory Visit | Attending: Obstetrics and Gynecology | Admitting: Obstetrics and Gynecology

## 2016-08-23 DIAGNOSIS — F1290 Cannabis use, unspecified, uncomplicated: Secondary | ICD-10-CM | POA: Diagnosis present

## 2016-08-23 DIAGNOSIS — Z6838 Body mass index (BMI) 38.0-38.9, adult: Secondary | ICD-10-CM | POA: Diagnosis not present

## 2016-08-23 DIAGNOSIS — O9842 Viral hepatitis complicating childbirth: Secondary | ICD-10-CM | POA: Diagnosis not present

## 2016-08-23 DIAGNOSIS — B1910 Unspecified viral hepatitis B without hepatic coma: Secondary | ICD-10-CM | POA: Diagnosis not present

## 2016-08-23 DIAGNOSIS — O99214 Obesity complicating childbirth: Secondary | ICD-10-CM | POA: Diagnosis present

## 2016-08-23 DIAGNOSIS — O99324 Drug use complicating childbirth: Secondary | ICD-10-CM | POA: Diagnosis present

## 2016-08-23 DIAGNOSIS — O9952 Diseases of the respiratory system complicating childbirth: Secondary | ICD-10-CM | POA: Diagnosis present

## 2016-08-23 DIAGNOSIS — J45909 Unspecified asthma, uncomplicated: Secondary | ICD-10-CM | POA: Diagnosis present

## 2016-08-23 DIAGNOSIS — Z3A39 39 weeks gestation of pregnancy: Secondary | ICD-10-CM | POA: Diagnosis not present

## 2016-08-23 DIAGNOSIS — Z87891 Personal history of nicotine dependence: Secondary | ICD-10-CM | POA: Diagnosis not present

## 2016-08-23 DIAGNOSIS — Z3493 Encounter for supervision of normal pregnancy, unspecified, third trimester: Secondary | ICD-10-CM | POA: Diagnosis present

## 2016-08-23 LAB — URINALYSIS, ROUTINE W REFLEX MICROSCOPIC
Bilirubin Urine: NEGATIVE
GLUCOSE, UA: 50 mg/dL — AB
Hgb urine dipstick: NEGATIVE
KETONES UR: NEGATIVE mg/dL
Leukocytes, UA: NEGATIVE
NITRITE: NEGATIVE
Protein, ur: NEGATIVE mg/dL
Specific Gravity, Urine: 1.014 (ref 1.005–1.030)
pH: 7 (ref 5.0–8.0)

## 2016-08-23 LAB — CBC
HCT: 33.7 % — ABNORMAL LOW (ref 36.0–46.0)
Hemoglobin: 11.2 g/dL — ABNORMAL LOW (ref 12.0–15.0)
MCH: 28.1 pg (ref 26.0–34.0)
MCHC: 33.2 g/dL (ref 30.0–36.0)
MCV: 84.5 fL (ref 78.0–100.0)
PLATELETS: 211 10*3/uL (ref 150–400)
RBC: 3.99 MIL/uL (ref 3.87–5.11)
RDW: 16.4 % — ABNORMAL HIGH (ref 11.5–15.5)
WBC: 6.1 10*3/uL (ref 4.0–10.5)

## 2016-08-23 LAB — RAPID URINE DRUG SCREEN, HOSP PERFORMED
Amphetamines: NOT DETECTED
BARBITURATES: NOT DETECTED
Benzodiazepines: NOT DETECTED
COCAINE: NOT DETECTED
Opiates: NOT DETECTED
Tetrahydrocannabinol: POSITIVE — AB

## 2016-08-23 MED ORDER — OXYCODONE-ACETAMINOPHEN 5-325 MG PO TABS: 2.0000 | ORAL_TABLET | ORAL | Status: DC | PRN

## 2016-08-23 MED ORDER — FENTANYL 2.5 MCG/ML BUPIVACAINE 1/10 % EPIDURAL INFUSION (WH - ANES)
14.0000 mL/h | INTRAMUSCULAR | Status: DC | PRN
  Administered 2016-08-23: 14 mL/h via EPIDURAL
  Filled 2016-08-23: qty 100

## 2016-08-23 MED ORDER — EPHEDRINE 5 MG/ML INJ
10.0000 mg | INTRAVENOUS | Status: DC | PRN
  Filled 2016-08-23: qty 4

## 2016-08-23 MED ORDER — DIPHENHYDRAMINE HCL 50 MG/ML IJ SOLN: 12.5000 mg | INTRAMUSCULAR | Status: DC | PRN

## 2016-08-23 MED ORDER — OXYTOCIN BOLUS FROM INFUSION
500.0000 mL | Freq: Once | INTRAVENOUS | Status: AC
  Administered 2016-08-24: 500 mL via INTRAVENOUS

## 2016-08-23 MED ORDER — SOD CITRATE-CITRIC ACID 500-334 MG/5ML PO SOLN: 30.0000 mL | ORAL | Status: DC | PRN

## 2016-08-23 MED ORDER — FLEET ENEMA 7-19 GM/118ML RE ENEM: 1.0000 | ENEMA | RECTAL | Status: DC | PRN

## 2016-08-23 MED ORDER — FENTANYL CITRATE (PF) 100 MCG/2ML IJ SOLN
100.0000 ug | Freq: Once | INTRAMUSCULAR | Status: AC
  Administered 2016-08-23: 100 ug via INTRAVENOUS
  Filled 2016-08-23: qty 2

## 2016-08-23 MED ORDER — LACTATED RINGERS IV SOLN: 500.0000 mL | INTRAVENOUS | Status: DC | PRN

## 2016-08-23 MED ORDER — ONDANSETRON HCL 4 MG/2ML IJ SOLN: 4.0000 mg | Freq: Four times a day (QID) | INTRAMUSCULAR | Status: DC | PRN

## 2016-08-23 MED ORDER — PHENYLEPHRINE 40 MCG/ML (10ML) SYRINGE FOR IV PUSH (FOR BLOOD PRESSURE SUPPORT)
80.0000 ug | PREFILLED_SYRINGE | INTRAVENOUS | Status: DC | PRN
  Filled 2016-08-23: qty 5

## 2016-08-23 MED ORDER — LIDOCAINE HCL (PF) 1 % IJ SOLN
30.0000 mL | INTRAMUSCULAR | Status: DC | PRN
  Filled 2016-08-23: qty 30

## 2016-08-23 MED ORDER — LACTATED RINGERS IV SOLN: INTRAVENOUS | Status: DC

## 2016-08-23 MED ORDER — ACETAMINOPHEN 325 MG PO TABS: 650.0000 mg | ORAL_TABLET | ORAL | Status: DC | PRN

## 2016-08-23 MED ORDER — LIDOCAINE HCL (PF) 1 % IJ SOLN
INTRAMUSCULAR | Status: DC | PRN
  Administered 2016-08-23 (×2): 4 mL via EPIDURAL

## 2016-08-23 MED ORDER — OXYCODONE-ACETAMINOPHEN 5-325 MG PO TABS: 1.0000 | ORAL_TABLET | ORAL | Status: DC | PRN

## 2016-08-23 MED ORDER — LACTATED RINGERS IV SOLN
500.0000 mL | Freq: Once | INTRAVENOUS | Status: AC
  Administered 2016-08-23: 500 mL via INTRAVENOUS

## 2016-08-23 MED ORDER — FENTANYL CITRATE (PF) 100 MCG/2ML IJ SOLN: 50.0000 ug | INTRAMUSCULAR | Status: DC | PRN

## 2016-08-23 MED ORDER — OXYTOCIN 40 UNITS IN LACTATED RINGERS INFUSION - SIMPLE MED
2.5000 [IU]/h | INTRAVENOUS | Status: DC
  Filled 2016-08-23: qty 1000

## 2016-08-23 MED ORDER — PHENYLEPHRINE 40 MCG/ML (10ML) SYRINGE FOR IV PUSH (FOR BLOOD PRESSURE SUPPORT)
80.0000 ug | PREFILLED_SYRINGE | INTRAVENOUS | Status: DC | PRN
  Filled 2016-08-23: qty 10
  Filled 2016-08-23: qty 5

## 2016-08-23 NOTE — Anesthesia Preprocedure Evaluation (Signed)
Anesthesia Evaluation  Patient identified by MRN, date of birth, ID band Patient awake    Reviewed: Allergy & Precautions, Patient's Chart, lab work & pertinent test results  History of Anesthesia Complications (+) PONV and history of anesthetic complications  Airway Mallampati: II  TM Distance: >3 FB Neck ROM: Full    Dental no notable dental hx.    Pulmonary asthma , former smoker,    Pulmonary exam normal breath sounds clear to auscultation       Cardiovascular hypertension, Normal cardiovascular exam+ Valvular Problems/Murmurs  Rhythm:Regular Rate:Normal     Neuro/Psych PSYCHIATRIC DISORDERS Anxiety Depression Bipolar Disorder    GI/Hepatic (+) Hepatitis -, B  Endo/Other  diabetes, Well Controlled, GestationalMorbid obesity  Renal/GU   negative genitourinary   Musculoskeletal negative musculoskeletal ROS (+)   Abdominal (+) + obese,   Peds  Hematology  (+) anemia ,   Anesthesia Other Findings   Reproductive/Obstetrics (+) Pregnancy                             Anesthesia Physical Anesthesia Plan  ASA: III  Anesthesia Plan: Epidural   Post-op Pain Management:    Induction:   Airway Management Planned: Natural Airway  Additional Equipment:   Intra-op Plan:   Post-operative Plan:   Informed Consent: I have reviewed the patients History and Physical, chart, labs and discussed the procedure including the risks, benefits and alternatives for the proposed anesthesia with the patient or authorized representative who has indicated his/her understanding and acceptance.     Plan Discussed with: Anesthesiologist  Anesthesia Plan Comments:         Anesthesia Quick Evaluation

## 2016-08-23 NOTE — MAU Note (Signed)
Pt left earlier was 2 cm. Pt is very uncomfrotable feels like she needs to push. Assisted pt to bed and SVE  5/80/-1. Intact

## 2016-08-23 NOTE — Discharge Instructions (Signed)
Third Trimester of Pregnancy °The third trimester is from week 29 through week 40 (months 7 through 9). The third trimester is a time when the unborn baby (fetus) is growing rapidly. At the end of the ninth month, the fetus is about 20 inches in length and weighs 6-10 pounds. °Body changes during your third trimester °Your body goes through many changes during pregnancy. The changes vary from woman to woman. During the third trimester: °· Your weight will continue to increase. You can expect to gain 25-35 pounds (11-16 kg) by the end of the pregnancy. °· You may begin to get stretch marks on your hips, abdomen, and breasts. °· You may urinate more often because the fetus is moving lower into your pelvis and pressing on your bladder. °· You may develop or continue to have heartburn. This is caused by increased hormones that slow down muscles in the digestive tract. °· You may develop or continue to have constipation because increased hormones slow digestion and cause the muscles that push waste through your intestines to relax. °· You may develop hemorrhoids. These are swollen veins (varicose veins) in the rectum that can itch or be painful. °· You may develop swollen, bulging veins (varicose veins) in your legs. °· You may have increased body aches in the pelvis, back, or thighs. This is due to weight gain and increased hormones that are relaxing your joints. °· You may have changes in your hair. These can include thickening of your hair, rapid growth, and changes in texture. Some women also have hair loss during or after pregnancy, or hair that feels dry or thin. Your hair will most likely return to normal after your baby is born. °· Your breasts will continue to grow and they will continue to become tender. A yellow fluid (colostrum) may leak from your breasts. This is the first milk you are producing for your baby. °· Your belly button may stick out. °· You may notice more swelling in your hands, face, or  ankles. °· You may have increased tingling or numbness in your hands, arms, and legs. The skin on your belly may also feel numb. °· You may feel short of breath because of your expanding uterus. °· You may have more problems sleeping. This can be caused by the size of your belly, increased need to urinate, and an increase in your body's metabolism. °· You may notice the fetus "dropping," or moving lower in your abdomen. °· You may have increased vaginal discharge. °· Your cervix becomes thin and soft (effaced) near your due date. °What to expect at prenatal visits °You will have prenatal exams every 2 weeks until week 36. Then you will have weekly prenatal exams. During a routine prenatal visit: °· You will be weighed to make sure you and the fetus are growing normally. °· Your blood pressure will be taken. °· Your abdomen will be measured to track your baby's growth. °· The fetal heartbeat will be listened to. °· Any test results from the previous visit will be discussed. °· You may have a cervical check near your due date to see if you have effaced. °At around 36 weeks, your health care provider will check your cervix. At the same time, your health care provider will also perform a test on the secretions of the vaginal tissue. This test is to determine if a type of bacteria, Group B streptococcus, is present. Your health care provider will explain this further. °Your health care provider may ask you: °·   What your birth plan is. °· How you are feeling. °· If you are feeling the baby move. °· If you have had any abnormal symptoms, such as leaking fluid, bleeding, severe headaches, or abdominal cramping. °· If you are using any tobacco products, including cigarettes, chewing tobacco, and electronic cigarettes. °· If you have any questions. °Other tests or screenings that may be performed during your third trimester include: °· Blood tests that check for low iron levels (anemia). °· Fetal testing to check the health,  activity level, and growth of the fetus. Testing is done if you have certain medical conditions or if there are problems during the pregnancy. °· Nonstress test (NST). This test checks the health of your baby to make sure there are no signs of problems, such as the baby not getting enough oxygen. During this test, a belt is placed around your belly. The baby is made to move, and its heart rate is monitored during movement. °What is false labor? °False labor is a condition in which you feel small, irregular tightenings of the muscles in the womb (contractions) that eventually go away. These are called Braxton Hicks contractions. Contractions may last for hours, days, or even weeks before true labor sets in. If contractions come at regular intervals, become more frequent, increase in intensity, or become painful, you should see your health care provider. °What are the signs of labor? °· Abdominal cramps. °· Regular contractions that start at 10 minutes apart and become stronger and more frequent with time. °· Contractions that start on the top of the uterus and spread down to the lower abdomen and back. °· Increased pelvic pressure and dull back pain. °· A watery or bloody mucus discharge that comes from the vagina. °· Leaking of amniotic fluid. This is also known as your "water breaking." It could be a slow trickle or a gush. Let your doctor know if it has a color or strange odor. °If you have any of these signs, call your health care provider right away, even if it is before your due date. °Follow these instructions at home: °Eating and drinking °· Continue to eat regular, healthy meals. °· Do not eat: °¨ Raw meat or meat spreads. °¨ Unpasteurized milk or cheese. °¨ Unpasteurized juice. °¨ Store-made salad. °¨ Refrigerated smoked seafood. °¨ Hot dogs or deli meat, unless they are piping hot. °¨ More than 6 ounces of albacore tuna a week. °¨ Shark, swordfish, king mackerel, or tile fish. °¨ Store-made salads. °¨ Raw  sprouts, such as mung bean or alfalfa sprouts. °· Take prenatal vitamins as told by your health care provider. °· Take 1000 mg of calcium daily as told by your health care provider. °· If you develop constipation: °¨ Take over-the-counter or prescription medicines. °¨ Drink enough fluid to keep your urine clear or pale yellow. °¨ Eat foods that are high in fiber, such as fresh fruits and vegetables, whole grains, and beans. °¨ Limit foods that are high in fat and processed sugars, such as fried and sweet foods. °Activity °· Exercise only as directed by your health care provider. Healthy pregnant women should aim for 2 hours and 30 minutes of moderate exercise per week. If you experience any pain or discomfort while exercising, stop. °· Avoid heavy lifting. °· Do not exercise in extreme heat or humidity, or at high altitudes. °· Wear low-heel, comfortable shoes. °· Practice good posture. °· Do not travel far distances unless it is absolutely necessary and only with the approval   of your health care provider. °· Wear your seat belt at all times while in a car, on a bus, or on a plane. °· Take frequent breaks and rest with your legs elevated if you have leg cramps or low back pain. °· Do not use hot tubs, steam rooms, or saunas. °· You may continue to have sex unless your health care provider tells you otherwise. °Lifestyle °· Do not use any products that contain nicotine or tobacco, such as cigarettes and e-cigarettes. If you need help quitting, ask your health care provider. °· Do not drink alcohol. °· Do not use any medicinal herbs or unprescribed drugs. These chemicals affect the formation and growth of the baby. °· If you develop varicose veins: °¨ Wear support pantyhose or compression stockings as told by your healthcare provider. °¨ Elevate your feet for 15 minutes, 3-4 times a day. °· Wear a supportive maternity bra to help with breast tenderness. °General instructions °· Take over-the-counter and prescription  medicines only as told by your health care provider. There are medicines that are either safe or unsafe to take during pregnancy. °· Take warm sitz baths to soothe any pain or discomfort caused by hemorrhoids. Use hemorrhoid cream or witch hazel if your health care provider approves. °· Avoid cat litter boxes and soil used by cats. These carry germs that can cause birth defects in the baby. If you have a cat, ask someone to clean the litter box for you. °· To prepare for the arrival of your baby: °¨ Take prenatal classes to understand, practice, and ask questions about the labor and delivery. °¨ Make a trial run to the hospital. °¨ Visit the hospital and tour the maternity area. °¨ Arrange for maternity or paternity leave through employers. °¨ Arrange for family and friends to take care of pets while you are in the hospital. °¨ Purchase a rear-facing car seat and make sure you know how to install it in your car. °¨ Pack your hospital bag. °¨ Prepare the baby’s nursery. Make sure to remove all pillows and stuffed animals from the baby's crib to prevent suffocation. °· Visit your dentist if you have not gone during your pregnancy. Use a soft toothbrush to brush your teeth and be gentle when you floss. °· Keep all prenatal follow-up visits as told by your health care provider. This is important. °Contact a health care provider if: °· You are unsure if you are in labor or if your water has broken. °· You become dizzy. °· You have mild pelvic cramps, pelvic pressure, or nagging pain in your abdominal area. °· You have lower back pain. °· You have persistent nausea, vomiting, or diarrhea. °· You have an unusual or bad smelling vaginal discharge. °· You have pain when you urinate. °Get help right away if: °· You have a fever. °· You are leaking fluid from your vagina. °· You have spotting or bleeding from your vagina. °· You have severe abdominal pain or cramping. °· You have rapid weight loss or weight gain. °· You have  shortness of breath with chest pain. °· You notice sudden or extreme swelling of your face, hands, ankles, feet, or legs. °· Your baby makes fewer than 10 movements in 2 hours. °· You have severe headaches that do not go away with medicine. °· You have vision changes. °Summary °· The third trimester is from week 29 through week 40, months 7 through 9. The third trimester is a time when the unborn baby (fetus)   is growing rapidly. °· During the third trimester, your discomfort may increase as you and your baby continue to gain weight. You may have abdominal, leg, and back pain, sleeping problems, and an increased need to urinate. °· During the third trimester your breasts will keep growing and they will continue to become tender. A yellow fluid (colostrum) may leak from your breasts. This is the first milk you are producing for your baby. °· False labor is a condition in which you feel small, irregular tightenings of the muscles in the womb (contractions) that eventually go away. These are called Braxton Hicks contractions. Contractions may last for hours, days, or even weeks before true labor sets in. °· Signs of labor can include: abdominal cramps; regular contractions that start at 10 minutes apart and become stronger and more frequent with time; watery or bloody mucus discharge that comes from the vagina; increased pelvic pressure and dull back pain; and leaking of amniotic fluid. °This information is not intended to replace advice given to you by your health care provider. Make sure you discuss any questions you have with your health care provider. °Document Released: 07/15/2001 Document Revised: 12/27/2015 Document Reviewed: 09/21/2012 °Elsevier Interactive Patient Education © 2017 [employer] °Introduction °Patient Name: ________________________________________________ Patient Due Date: ____________________ °What is a fetal movement count? °A fetal movement count is the number of times that you feel your baby  move during a certain amount of time. This may also be called a fetal kick count. A fetal movement count is recommended for every pregnant woman. You may be asked to start counting fetal movements as early as week 28 of your pregnancy. °Pay attention to when your baby is most active. You may notice your baby's sleep and wake cycles. You may also notice things that make your baby move more. You should do a fetal movement count: °· When your baby is normally most active. °· At the same time each day. °A good time to count movements is while you are resting, after having something to eat and drink. °How do I count fetal movements? °1. Find a quiet, comfortable area. Sit, or lie down on your side. °2. Write down the date, the start time and stop time, and the number of movements that you felt between those two times. Take this information with you to your health care visits. °3. For 2 hours, count kicks, flutters, swishes, rolls, and jabs. You should feel at least 10 movements during 2 hours. °4. You may stop counting after you have felt 10 movements. °5. If you do not feel 10 movements in 2 hours, have something to eat and drink. Then, keep resting and counting for 1 hour. If you feel at least 4 movements during that hour, you may stop counting. °Contact a health care provider if: °· You feel fewer than 4 movements in 2 hours. °· Your baby is not moving like he or she usually does. °Date: ____________ Start time: ____________ Stop time: ____________ Movements: ____________ °Date: ____________ Start time: ____________ Stop time: ____________ Movements: ____________ °Date: ____________ Start time: ____________ Stop time: ____________ Movements: ____________ °Date: ____________ Start time: ____________ Stop time: ____________ Movements: ____________ °Date: ____________ Start time: ____________ Stop time: ____________ Movements: ____________ °Date: ____________ Start time: ____________ Stop time: ____________ Movements:  ____________ °Date: ____________ Start time: ____________ Stop time: ____________ Movements: ____________ °Date: ____________ Start time: ____________ Stop time: ____________ Movements: ____________ °Date: ____________ Start time: ____________ Stop time: ____________ Movements: ____________ °This information is not intended to replace   advice given to you by your health care provider. Make sure you discuss any questions you have with your health care provider. °Document Released: 08/20/2006 Document Revised: 03/19/2016 Document Reviewed: 08/30/2015 °Elsevier Interactive Patient Education © 2017 [employer] Inc[employer] ° °

## 2016-08-23 NOTE — MAU Note (Signed)
Pt presents to MAU with complaints of contractions that started around 4pm today. Denies any vaginal bleeding or LOF

## 2016-08-23 NOTE — Anesthesia Procedure Notes (Signed)
Epidural Patient location during procedure: OB Start time: 08/23/2016 11:42 PM  Staffing Anesthesiologist: Mal AmabileFOSTER, Selenne Coggin Performed: anesthesiologist   Preanesthetic Checklist Completed: patient identified, site marked, surgical consent, pre-op evaluation, timeout performed, IV checked, risks and benefits discussed and monitors and equipment checked  Epidural Patient position: sitting Prep: site prepped and draped and DuraPrep Patient monitoring: continuous pulse ox and blood pressure Approach: midline Location: L3-L4 Injection technique: LOR air  Needle:  Needle type: Tuohy  Needle gauge: 17 G Needle length: 9 cm and 9 Needle insertion depth: 6 cm Catheter type: closed end flexible Catheter size: 19 Gauge Catheter at skin depth: 11 cm Test dose: negative and Other  Assessment Events: blood not aspirated, injection not painful, no injection resistance, negative IV test and no paresthesia  Additional Notes Patient identified. Risks and benefits discussed including failed block, incomplete  Pain control, post dural puncture headache, nerve damage, paralysis, blood pressure Changes, nausea, vomiting, reactions to medications-both toxic and allergic and post Partum back pain. All questions were answered. Patient expressed understanding and wished to proceed. Sterile technique was used throughout procedure. Epidural site was Dressed with sterile barrier dressing. No paresthesias, signs of intravascular injection Or signs of intrathecal spread were encountered.  Patient was more comfortable after the epidural was dosed. Please see RN's note for documentation of vital signs and FHR which are stable.

## 2016-08-24 ENCOUNTER — Encounter (HOSPITAL_COMMUNITY): Payer: Self-pay | Admitting: General Practice

## 2016-08-24 DIAGNOSIS — B1910 Unspecified viral hepatitis B without hepatic coma: Secondary | ICD-10-CM

## 2016-08-24 DIAGNOSIS — O9842 Viral hepatitis complicating childbirth: Secondary | ICD-10-CM

## 2016-08-24 DIAGNOSIS — Z3A39 39 weeks gestation of pregnancy: Secondary | ICD-10-CM

## 2016-08-24 LAB — HIV ANTIBODY (ROUTINE TESTING W REFLEX): HIV SCREEN 4TH GENERATION: NONREACTIVE

## 2016-08-24 LAB — CBC
HCT: 28.6 % — ABNORMAL LOW (ref 36.0–46.0)
Hemoglobin: 9.7 g/dL — ABNORMAL LOW (ref 12.0–15.0)
MCH: 28.4 pg (ref 26.0–34.0)
MCHC: 33.9 g/dL (ref 30.0–36.0)
MCV: 83.9 fL (ref 78.0–100.0)
PLATELETS: 176 10*3/uL (ref 150–400)
RBC: 3.41 MIL/uL — ABNORMAL LOW (ref 3.87–5.11)
RDW: 16.5 % — AB (ref 11.5–15.5)
WBC: 8.2 10*3/uL (ref 4.0–10.5)

## 2016-08-24 LAB — GLUCOSE, CAPILLARY: GLUCOSE-CAPILLARY: 100 mg/dL — AB (ref 65–99)

## 2016-08-24 LAB — TYPE AND SCREEN
ABO/RH(D): A POS
Antibody Screen: NEGATIVE

## 2016-08-24 LAB — GROUP B STREP BY PCR: GROUP B STREP BY PCR: NEGATIVE

## 2016-08-24 LAB — RPR: RPR: NONREACTIVE

## 2016-08-24 MED ORDER — FENTANYL CITRATE (PF) 100 MCG/2ML IJ SOLN
INTRAMUSCULAR | Status: AC
  Filled 2016-08-24: qty 2

## 2016-08-24 MED ORDER — PRENATAL MULTIVITAMIN CH
1.0000 | ORAL_TABLET | Freq: Every day | ORAL | Status: DC
  Administered 2016-08-24 – 2016-08-26 (×3): 1 via ORAL
  Filled 2016-08-24 (×3): qty 1

## 2016-08-24 MED ORDER — ZOLPIDEM TARTRATE 5 MG PO TABS: 5.0000 mg | ORAL_TABLET | Freq: Every evening | ORAL | Status: DC | PRN

## 2016-08-24 MED ORDER — ACETAMINOPHEN 325 MG PO TABS: 650.0000 mg | ORAL_TABLET | ORAL | Status: DC | PRN

## 2016-08-24 MED ORDER — BUPIVACAINE HCL (PF) 0.25 % IJ SOLN
INTRAMUSCULAR | Status: DC | PRN
  Administered 2016-08-24 (×2): 5 mL via EPIDURAL

## 2016-08-24 MED ORDER — DIBUCAINE 1 % RE OINT
1.0000 | TOPICAL_OINTMENT | RECTAL | Status: DC | PRN
Start: 2016-08-24 — End: 2016-08-26

## 2016-08-24 MED ORDER — OXYTOCIN 40 UNITS IN LACTATED RINGERS INFUSION - SIMPLE MED: 2.5000 [IU]/h | INTRAVENOUS | Status: DC | PRN

## 2016-08-24 MED ORDER — SENNOSIDES-DOCUSATE SODIUM 8.6-50 MG PO TABS
2.0000 | ORAL_TABLET | ORAL | Status: DC
  Administered 2016-08-25 (×2): 2 via ORAL
  Filled 2016-08-24 (×2): qty 2

## 2016-08-24 MED ORDER — ONDANSETRON HCL 4 MG PO TABS: 4.0000 mg | ORAL_TABLET | ORAL | Status: DC | PRN

## 2016-08-24 MED ORDER — ONDANSETRON HCL 4 MG/2ML IJ SOLN: 4.0000 mg | INTRAMUSCULAR | Status: DC | PRN

## 2016-08-24 MED ORDER — COCONUT OIL OIL: 1.0000 "application " | TOPICAL_OIL | Status: DC | PRN

## 2016-08-24 MED ORDER — TETANUS-DIPHTH-ACELL PERTUSSIS 5-2.5-18.5 LF-MCG/0.5 IM SUSP
0.5000 mL | Freq: Once | INTRAMUSCULAR | Status: AC
  Administered 2016-08-25: 0.5 mL via INTRAMUSCULAR

## 2016-08-24 MED ORDER — IBUPROFEN 600 MG PO TABS
600.0000 mg | ORAL_TABLET | Freq: Four times a day (QID) | ORAL | Status: DC
  Administered 2016-08-24 – 2016-08-26 (×10): 600 mg via ORAL
  Filled 2016-08-24 (×10): qty 1

## 2016-08-24 MED ORDER — BENZOCAINE-MENTHOL 20-0.5 % EX AERO
1.0000 | INHALATION_SPRAY | CUTANEOUS | Status: DC | PRN
Start: 2016-08-24 — End: 2016-08-26

## 2016-08-24 MED ORDER — SIMETHICONE 80 MG PO CHEW: 80.0000 mg | CHEWABLE_TABLET | ORAL | Status: DC | PRN

## 2016-08-24 MED ORDER — SODIUM CHLORIDE 0.9 % IV SOLN: 250.0000 mL | INTRAVENOUS | Status: DC | PRN

## 2016-08-24 MED ORDER — SODIUM CHLORIDE 0.9% FLUSH: 3.0000 mL | Freq: Two times a day (BID) | INTRAVENOUS | Status: DC

## 2016-08-24 MED ORDER — WITCH HAZEL-GLYCERIN EX PADS: 1.0000 "application " | MEDICATED_PAD | CUTANEOUS | Status: DC | PRN

## 2016-08-24 MED ORDER — FENTANYL CITRATE (PF) 100 MCG/2ML IJ SOLN
INTRAMUSCULAR | Status: DC | PRN
  Administered 2016-08-24 (×2): 50 ug via EPIDURAL

## 2016-08-24 MED ORDER — SODIUM CHLORIDE 0.9% FLUSH: 3.0000 mL | INTRAVENOUS | Status: DC | PRN

## 2016-08-24 MED ORDER — DIPHENHYDRAMINE HCL 25 MG PO CAPS: 25.0000 mg | ORAL_CAPSULE | Freq: Four times a day (QID) | ORAL | Status: DC | PRN

## 2016-08-24 NOTE — Clinical Social Work Maternal (Signed)
CLINICAL SOCIAL WORK MATERNAL/CHILD NOTE  Patient Details  Name: Lauren Moss MRN: [id number] Date of Birth: 1990/12/30  Date:  08/24/2016  Clinical Social Worker Initiating Note:   (Odin Mariani lcsw) Date/ Time Initiated:  08/24/16/      Child's Name:      Legal Guardian:  Mother   Need for Interpreter:  None   Date of Referral:  08/23/16     Reason for Referral:  Current Substance Use/Substance Use During Pregnancy , Behavioral Health Issues, including SI , Late or No Prenatal Care    Referral Source:  Physician   Address:   ([address] [place] [address])  Phone number:  [phone]   Household Members:  Self, Domestic Warden/ranger (not living in the home):  Extended Family, Friends   Chiropodist: None   Employment:     Type of Work:  (Pt works for [employer] [place] [employer]   Education:  Database administrator Resources:  Medicaid   Other Resources:      Cultural/Religious Considerations Which May Impact Care:  None noted.  Strengths:  Home prepared for child , Ability to meet basic needs , Pediatrician chosen    Risk Factors/Current Problems:  Substance Use , Mental Health Concerns    Cognitive State:  Able to Concentrate , Alert , Insightful    Mood/Affect:  Calm , Relaxed , Happy , Animated   CSW Assessment: CSW met with pt and FOB.  Pt was breastfeeding her baby and commented that this was going well for her/baby.  CSW explained that social work was consulted due to her lack of prenatal care, and THC use.  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  Pt further stated that she needs her job in order to care for her baby.  One the topic of THC use, FOB was quick to take responsibility for pt's positive drug test, statting that he and friends have smoked around pt and her  positive test was the result of second-hand smoke.  Pt also denies smoking marijuana, as well as doing any other kind of drugs.   CSW counseled the couple re: smoking in the house when the baby comes home, and both agreed that this would not happen, as FOB plans on quitting.  CSW explained the need for cord blood testing, noting that any positive results would be reported to CPS with f/u coming from their office.  Pt/FOB indicated that they understood the necessity of this and agreed to be cooperative with the process.    Pt admitted to having been diagnosed with Bipolar D/O and severe depression.  Pt further admitted to filing for disability due to her mental health issues, but believes that the [employer] would deny her claim because she was working. Pt denies any current concerns re: her MH, stating that she does not take any medications, not does she participate in therapy.  Pt did not experience any s/s of PPD with her other four children (none of whom reside with her) but was receptive to receiving PPD information packet.   Pt is agreeable to f/u with her MD re: any changes in mood/behavior, post d/c.    Pt reports having a strong support system, including the FOB, her mother, co-workers and other friends.  Pt believes that she has all  necessary supplies to care for her baby and states that her home is prepared to receive baby.  No other CSW needs identified.  CSW will follow for cord testing results and will f/u appropriately.   CSW Plan/Description:  Other (Comment) (CSW will follow for cord blood testing results.)    Mischele Detter M, LCSW 08/24/2016, 2:00 PM

## 2016-08-24 NOTE — Anesthesia Postprocedure Evaluation (Signed)
Anesthesia Post Note  Patient: Lauren Moss  Procedure(s) Performed: * No procedures listed *  Patient location during evaluation: Mother Baby Anesthesia Type: Epidural Level of consciousness: awake and alert Pain management: pain level controlled Vital Signs Assessment: post-procedure vital signs reviewed and stable Respiratory status: spontaneous breathing and nonlabored ventilation Cardiovascular status: stable Postop Assessment: no headache, patient able to bend at knees, no backache, no signs of nausea or vomiting, epidural receding and adequate PO intake Anesthetic complications: no        Last Vitals:  Vitals:   08/24/16 0520 08/24/16 0940  BP: 128/67 (!) 139/54  Pulse: 76 89  Resp: 18 20  Temp: 37.4 C 37.4 C    Last Pain:  Vitals:   08/24/16 0940  TempSrc: Oral  PainSc:    Pain Goal: Patients Stated Pain Goal: 2 (08/23/16 2359)               Laban EmperorMalinova,Wanita Derenzo Hristova

## 2016-08-24 NOTE — Progress Notes (Signed)
Progress Notes Date of Service: 08/24/2016 4:01 PM Roanna Raider, LCSW  Clinical Social Work    [] Hide copied text [] Hover for attribution information CSW met briefly with pt to discuss the custody arrangement of her other 4 children, ages 30-7.  Initially, pt was reluctant to discuss the issue, stating "it's complicated" and remarking that she didn't really want to discuss it.  Pt then  confirmed with pt that her older children were adopted through a closed adoption and that she doesn't have any contact with them.  Pt believes that the children were adopted into the same family, to the best of her knowledge, and she is still trying to cope with the situation.  Pt denies abuse or neglect as reasons for her children being adopted, but did not wish discuss the reason (s) for the adoption.  Emotional support provided.  Creta Levin, LCSW Weekend Coverage [phone]    Electronically signed by Roanna Raider, LCSW at 08/24/2016 4:13 PM      Admission (Current) on 08/24/2016        Detailed Report

## 2016-08-24 NOTE — Lactation Note (Signed)
This note was copied from a baby's chart. Lactation Consultation Note  Patient Name: Lauren Moss CoastDanielle Schmelzle ZOXWR'UToday's Date: 08/24/2016 Reason for consult: Initial assessment   With this mom of a term baby, now 7512 hours old. Mom has been trying to formula feed him, but he kept spitting up or refused to suckle. 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 His tongue has an anterior  tight frenulum, causing a cleft in the front of his tongue, and humping of his tongue in the back of his throat, and with elevation, he cups his tongue. I tried finger sucking on a gloved finger. He tries to suckle, but did not move my finger into his mouth at all.  Surprisingly though, he appeared to well with breast feeding. I showed mom how to latch him in cross cradle hold, with pillow supports, and he latched deeply, and suckled for 17 minutes. He unlatched by himself, then was rooting, 2 times more relatched and suckled. He fed for about 30 minutes total, and I did hear swallows, hopefully it was colostrum, and not saliva! Mom denies and sdiscomfort with latch, and her nipples did not appear pinched after feeding, at this time.  I showed mom how to hand express, and she was pleased to see she does have colostrum to feed him. I also set up a DEP, and instructed her to pump in initiation setting, and try to pump every 3 hours, followed with hand expression.  Mom has WIC, so a fax was sent to say that mom will probably need a DEP at discharge. Mom knows to call for questions/conerns.    Maternal Data Formula Feeding for Exclusion: Yes Reason for exclusion: Mother's choice to formula and breast feed on admission Has patient been taught Hand Expression?: Yes  Feeding Feeding Type: Breast Fed Length of feed: 30 min (baby suckled for 17 mins, self unlatch, the relatched and suckled 5 minutes, unlatched and was still feeding when I left  the room)  LATCH Score/Interventions Latch: Grasps breast easily, tongue down, lips flanged, rhythmical sucking. Intervention(s): Adjust position;Assist with latch  Audible Swallowing: Spontaneous and intermittent  Type of Nipple: Everted at rest and after stimulation (very soft . compressible breast tissue)  Comfort (Breast/Nipple): Soft / non-tender     Hold (Positioning): Assistance needed to correctly position infant at breast and maintain latch. Intervention(s): Breastfeeding basics reviewed;Support Pillows;Position options;Skin to skin  LATCH Score: 9  Lactation Tools Discussed/Used WIC Program: Yes Initiated by:: Bettymae Yott. Rn IBCLC Date initiated:: 08/24/16   Consult Status Consult Status: Follow-up Date: 08/25/16 Follow-up type: In-patient    Alfred LevinsLee, Ziaire Bieser Anne 08/24/2016, 3:17 PM

## 2016-08-24 NOTE — H&P (Signed)
LABOR AND DELIVERY ADMISSION HISTORY AND PHYSICAL NOTE  Lauren Moss is a 26 y.o. female [phone] with IUP at 76w5dby LMP presenting for SOL.   She reports positive fetal movement. She denies leakage of fluid or vaginal bleeding.  Prenatal History/Complications:  Limited to no prenatal care;  Pyelectasis (never had repeat UKoreato reassess); ?oligohydramnios (per patient, from [place] couple weeks ago)  Past Medical History: Past Medical History:  Diagnosis Date  . Anemia   . Asthma   . Bipolar 1 disorder (HCC)    hx cutting   . Bipolar disorder ([place]   . Chlamydia   . Depression   . Gestational diabetes   . Heart murmur   . Hepatitis B antibody positive   . Mental disorder    depression and bipolar  . PONV (postoperative nausea and vomiting)   . Pregnancy induced hypertension   . Preterm labor   . S/P dilation and curettage    for TAB and ectopic    Past Surgical History: Past Surgical History:  Procedure Laterality Date  . DILATION AND CURETTAGE OF UTERUS    . LIVER BIOPSY    . SKIN GRAFT    . SMALL INTESTINE SURGERY     pt had intusseseption at 26years old  . WISDOM TOOTH EXTRACTION      Obstetrical History: OB History    Gravida Para Term Preterm AB Living   6 4 3 1 1 4    SAB TAB Ectopic Multiple Live Births   0 0 1 0 4      Social History: Social History   Social History  . Marital status: Single    Spouse name: N/A  . Number of children: N/A  . Years of education: N/A   Social History Main Topics  . Smoking status: Former Smoker    Quit date: 07/08/2012  . Smokeless tobacco: Never Used  . Alcohol use No  . Drug use: No  . Sexual activity: Yes    Birth control/ protection: None   Other Topics Concern  . None   Social History Narrative  . None    Family History: Family History  Problem Relation Age of Onset  . Adopted: Yes    Allergies: Allergies  Allergen Reactions  . Other Anaphylaxis    Curry seasoning     Prescriptions Prior to Admission  Medication Sig Dispense Refill Last Dose  . albuterol (PROVENTIL HFA;VENTOLIN HFA) 108 (90 BASE) MCG/ACT inhaler Inhale 2 puffs into the lungs every 6 (six) hours as needed for wheezing or shortness of breath. 1 Inhaler 2 Taking  . albuterol (PROVENTIL) (2.5 MG/3ML) 0.083% nebulizer solution Take 3 mLs (2.5 mg total) by nebulization every 4 (four) hours as needed for wheezing. 75 mL 12 Taking  . ondansetron (ZOFRAN) 4 MG tablet Take 1 tablet (4 mg total) by mouth every 6 (six) hours. (Patient not taking: Reported on 04/02/2016) 20 tablet 0 Not Taking  . Prenatal Vit-Fe Fumarate-FA (PRENATAL MULTIVITAMIN) TABS tablet Take 1 tablet by mouth daily at 12 noon.   Taking  . promethazine (PHENERGAN) 25 MG tablet Take 1 tablet (25 mg total) by mouth once. 30 tablet 0   . vitamin B-6 (PYRIDOXINE) 25 MG tablet Take 1 tablet (25 mg total) by mouth every 8 (eight) hours as needed. (Patient not taking: Reported on 03/24/2016) 30 tablet 0 Not Taking     Review of Systems   All systems reviewed and negative except as stated in  HPI  Blood pressure (!) 106/58, pulse 98, temperature 98.5 F (36.9 C), temperature source Oral, resp. rate 20, height 5' 3"  (1.6 m), weight 219 lb (99.3 kg), last menstrual period 11/20/2015, unknown if currently breastfeeding. General appearance: alert, cooperative, appears stated age and mild distress from contractions Lungs: clear to auscultation bilaterally Heart: regular rate and rhythm Abdomen: soft, non-tender; bowel sounds normal Extremities: No calf swelling or tenderness Presentation: cephalic Fetal monitoring: 125 bpm, mod var +accels, a single variable decel with contraction with return to baseline Uterine activity: q2-4mn Dilation: 7 Effacement (%): 70 Station: -1 Exam by:: DrMumaw   Prenatal labs: ABO, Rh: --/--/A POS (01/20 2300) Antibody: NEG (01/20 2300) Rubella: !Error! Non-immune RPR: NON REAC (08/21 1603)  HBsAg:  POSITIVE (08/21 1603)  HIV: NONREACTIVE (08/21 1603)  GBS:    1 hr Glucola: DID NOT PERFORM (early 1 hr GTT was 111) Genetic screening:  Declined Anatomy [place] Fetal pyelectasis UTD A1 (18 wk [place] - never followed up for repeat  Prenatal Transfer Tool  Maternal Diabetes: No Genetic Screening: Declined Maternal Ultrasounds/Referrals: Abnormal:  Findings:   Fetal Kidney Anomalies Fetal Ultrasounds or other Referrals:  None Maternal Substance Abuse:  Yes:  Type: Marijuana Significant Maternal Medications:  None Significant Maternal Lab Results: Lab values include: HBsAG positive, Other:  GBS Unknown; Rubella non-immune   Results for orders placed or performed during the hospital encounter of 08/23/16 (from the past 24 hour(s))  Group B strep by PCR   Collection Time: 08/23/16 10:56 PM  Result Value Ref Range   Group B strep by PCR NEGATIVE NEGATIVE  CBC   Collection Time: 08/23/16 11:00 PM  Result Value Ref Range   WBC 6.1 4.0 - 10.5 K/uL   RBC 3.99 3.87 - 5.11 MIL/uL   Hemoglobin 11.2 (L) 12.0 - 15.0 g/dL   HCT 33.7 (L) 36.0 - 46.0 %   MCV 84.5 78.0 - 100.0 fL   MCH 28.1 26.0 - 34.0 pg   MCHC 33.2 30.0 - 36.0 g/dL   RDW 16.4 (H) 11.5 - 15.5 %   Platelets 211 150 - 400 K/uL  Type and screen WDrumright  Collection Time: 08/23/16 11:00 PM  Result Value Ref Range   ABO/RH(D) A POS    Antibody Screen NEG    Sample Expiration 08/26/2016   Results for orders placed or performed during the hospital encounter of 08/23/16 (from the past 24 hour(s))  Urinalysis, Routine w reflex microscopic   Collection Time: 08/23/16  7:30 PM  Result Value Ref Range   Color, Urine YELLOW YELLOW   APPearance CLEAR CLEAR   Specific Gravity, Urine 1.014 1.005 - 1.030   pH 7.0 5.0 - 8.0   Glucose, UA 50 (A) NEGATIVE mg/dL   Hgb urine dipstick NEGATIVE NEGATIVE   Bilirubin Urine NEGATIVE NEGATIVE   Ketones, ur NEGATIVE NEGATIVE mg/dL   Protein, ur NEGATIVE NEGATIVE mg/dL   Nitrite  NEGATIVE NEGATIVE   Leukocytes, UA NEGATIVE NEGATIVE  Urine rapid drug screen (hosp performed)   Collection Time: 08/23/16  7:30 PM  Result Value Ref Range   Opiates NONE DETECTED NONE DETECTED   Cocaine NONE DETECTED NONE DETECTED   Benzodiazepines NONE DETECTED NONE DETECTED   Amphetamines NONE DETECTED NONE DETECTED   Tetrahydrocannabinol POSITIVE (A) NONE DETECTED   Barbiturates NONE DETECTED NONE DETECTED    Patient Active Problem List   Diagnosis Date Noted  . Normal labor 08/23/2016  . Marijuana use 03/28/2016  . Supervision of  high risk pregnancy in second trimester 10/12/2012  . History of gestational diabetes in prior pregnancy, currently pregnant in second trimester 04/09/2011  . Depression 04/01/2011  . Bipolar disorder ([place]) 04/01/2011  . Hep B complicating pregnancy 06/20/3566    Assessment: ROWEN HUR is a 26 y.o. [id number] at 1w5dhere for SOL.  #Labor:SOL #Pain: Epidural #FWB: Cat I #ID:  GBS unknown, pending PCR; HEP B POSITIVE; Rubella non-immune - MMR postpartum #MOF: breast #MOC:Nexplanon #Circ:  NO #Limited to no prenatal care: Social work to be consulted #Hep B: Baby to receive IgG and Hep B vaccine; will keep amniotic sac intact as long as possible, avoid ISL #Pyelectasis on initial OB [place] [facility] [place]1/21/2018, 12:30 AM

## 2016-08-25 NOTE — Lactation Note (Signed)
This note was copied from a baby's chart. Lactation Consultation Note  Mother states she would only like to formula feed.  Patient Name: Boy Tana CoastDanielle Marchese XBMWU'XToday's Date: 08/25/2016     Maternal Data    Feeding Feeding Type: Formula Nipple Type: Slow - flow  LATCH Score/Interventions                      Lactation Tools Discussed/Used     Consult Status      Hardie PulleyBerkelhammer, Victoriano Campion Boschen 08/25/2016, 1:53 PM

## 2016-08-25 NOTE — Progress Notes (Signed)
CSW concerned about MOB's report to weekend CSW regarding the custody of her other children.  CSW contacted Child Management consultantrotective Services and left message for [facility] worker Junious DresserConnie [phone].  CSW also left message for [facility] Super[facility] Bjorn LoserRhonda [phone].  CSW spoke with pediatrician to ask that discharge be held until CSW receives call back from CPS staff.

## 2016-08-25 NOTE — Progress Notes (Signed)
Post Partum Day 1 Subjective: Patient reports no acute events overnight. Pain well managed, up and ambulating, voiding well, + bowel movement, lochia appropriate.  Objective: Blood pressure 131/66, pulse (!) 58, temperature 98.1 F (36.7 C), temperature source Oral, resp. rate 18, height 5\' 3"  (1.6 m), weight 99.3 kg (219 lb), last menstrual period 11/20/2015, SpO2 100 %, unknown if currently breastfeeding.  Physical Exam:  General: alert, cooperative and no distress Lochia: appropriate Uterine Fundus: firm Incision: none DVT Evaluation: No evidence of DVT seen on physical exam. No cords or calf tenderness. No significant calf/ankle edema.   Recent Labs  08/23/16 2300 08/24/16 0555  HGB 11.2* 9.7*  HCT 33.7* 28.6*    Assessment/Plan:  Lauren Moss is a 26 y.o. female, [phone] with SVD at 6762w5d.  Discharge home Breastfeeding Contraception with nexplanon   LOS: 2 days   Dartha LodgeKelsey N Ford 08/25/2016, 7:25 AM

## 2016-08-25 NOTE — Progress Notes (Addendum)
CSW received returned call from [place] left message for [place]. [place]/CPS intake in order to make report. CSW met with parents in MOB's room.  MOB gave permission to speak openly with FOB present.  CSW notified them that due to concerns regarding the custody of her other children, CSW needs for CPS to provide clearance prior to the hospital discharging baby.  MOB appeared confused and stated that she gave up her rights to her other children.  CSW informed MOB that CSW does not have access to that hx and, therefore, needs CPS to make the determination regarding discharge.  Parents were quiet and stated understanding.

## 2016-08-26 MED ORDER — IBUPROFEN 600 MG PO TABS
600.0000 mg | ORAL_TABLET | Freq: Four times a day (QID) | ORAL | 0 refills | Status: DC
Start: 2016-08-26 — End: 2016-11-28

## 2016-08-26 MED ORDER — MEASLES, MUMPS & RUBELLA VAC ~~LOC~~ INJ: 0.5000 mL | INJECTION | Freq: Once | SUBCUTANEOUS | Status: DC

## 2016-08-26 MED ORDER — MEASLES, MUMPS & RUBELLA VAC ~~LOC~~ INJ
0.5000 mL | INJECTION | Freq: Once | SUBCUTANEOUS | Status: AC
  Administered 2016-08-26: 0.5 mL via SUBCUTANEOUS
  Filled 2016-08-26: qty 0.5

## 2016-08-26 MED ORDER — SENNOSIDES-DOCUSATE SODIUM 8.6-50 MG PO TABS: 2.0000 | ORAL_TABLET | ORAL | 0 refills | Status: DC

## 2016-08-26 NOTE — Progress Notes (Signed)
CSW received returned call from [place] CPS Intake staff and made report due to concerns regarding custody of other children.  CSW requests that intake or worker contact CSW when case has been assigned. 

## 2016-08-26 NOTE — Progress Notes (Signed)
Lauren [phone] has been assigned CPS case.  Ms. Lauren Moss is at the hospital talking with MOB now and will follow up with CSW once initial assessment has been completed.

## 2016-08-26 NOTE — Discharge Summary (Signed)
OB Discharge Summary     Patient Name: Lauren Moss DOB: 05-Nov-1990 MRN: [id number]  Date of admission: 08/23/2016 Delivering MD: Jen Mow [facility]   Date of discharge: 08/26/2016  Admitting diagnosis: TERM, WATER BROKE Intrauterine pregnancy: [redacted]w[redacted]d     Secondary diagnosis:  Active Problems:   Normal labor   SVD (spontaneous vaginal delivery)  Additional problems:  Limited/No prenatal care     Discharge diagnosis: Term Pregnancy Delivered                                                                                                Post partum procedures:None  Augmentation: None  Complications: None  Hospital course:  Onset of Labor With Vaginal Delivery     26 y.o. yo [id number] at [redacted]w[redacted]d was admitted in Active Labor on 08/23/2016. Patient had an uncomplicated labor course as follows:  Membrane Rupture Time/Date: 1:03 AM ,08/24/2016   Intrapartum Procedures: Episiotomy: None [1]                                         Lacerations:  None [1]  Patient had a delivery of a Viable infant. 08/24/2016  Information for the patient's newborn:  Shakaya, Bhullar [[id number]]  Delivery Method: Vaginal, Spontaneous Delivery (Filed from Delivery Summary)    Pateint had an uncomplicated postpartum course.  She is ambulating, tolerating a regular diet, passing flatus, and urinating well. Patient is discharged home in stable condition on 08/26/16.   Physical exam  Vitals:   08/24/16 1750 08/25/16 0635 08/25/16 1745 08/26/16 0522  BP: 136/62 131/66 131/65 139/65  Pulse: 75 (!) 58 80 (!) 58  Resp: 20 18 18 18   Temp: 98.5 F (36.9 C) 98.1 F (36.7 C) 98.4 F (36.9 C) 97.8 F (36.6 C)  TempSrc: Oral Oral Oral Oral  SpO2: 100%     Weight:      Height:       General: alert, cooperative and no distress Lochia: appropriate Uterine Fundus: firm Incision: N/A DVT Evaluation: No evidence of DVT seen on physical exam. Negative Homan's sign. No significant calf/ankle  edema. Labs: Lab Results  Component Value Date   WBC 8.2 08/24/2016   HGB 9.7 (L) 08/24/2016   HCT 28.6 (L) 08/24/2016   MCV 83.9 08/24/2016   PLT 176 08/24/2016   CMP Latest Ref Rng & Units 04/29/2012  Glucose 70 - 99 mg/dL 95  BUN 6 - 23 mg/dL 6  Creatinine 6.57 - 8.46 mg/dL 9.62  Sodium 952 - 841 mEq/L 132(L)  Potassium 3.5 - 5.1 mEq/L 3.6  Chloride 96 - 112 mEq/L 98  CO2 19 - 32 mEq/L 23  Calcium 8.4 - 10.5 mg/dL 9.1  Total Protein 6.0 - 8.3 g/dL 7.6  Total Bilirubin 0.3 - 1.2 mg/dL 0.5  Alkaline Phos 39 - 117 U/L 67  AST 0 - 37 U/L 13  ALT 0 - 35 U/L 9    Discharge instruction: per After Visit Summary and "Baby and Me Booklet".  After visit meds:  Allergies as of 08/26/2016      Reactions   Other Anaphylaxis   Curry seasoning      Medication List    STOP taking these medications   promethazine 25 MG tablet Commonly known as:  PHENERGAN     TAKE these medications   albuterol (2.5 MG/3ML) 0.083% nebulizer solution Commonly known as:  PROVENTIL Take 3 mLs (2.5 mg total) by nebulization every 4 (four) hours as needed for wheezing.   albuterol 108 (90 Base) MCG/ACT inhaler Commonly known as:  PROVENTIL HFA;VENTOLIN HFA Inhale 2 puffs into the lungs every 6 (six) hours as needed for wheezing or shortness of breath.   ibuprofen 600 MG tablet Commonly known as:  ADVIL,MOTRIN Take 1 tablet (600 mg total) by mouth every 6 (six) hours.   prenatal multivitamin Tabs tablet Take 1 tablet by mouth daily at 12 noon.   senna-docusate 8.6-50 MG tablet Commonly known as:  Senokot-S Take 2 tablets by mouth daily. Start taking on:  08/27/2016       Diet: routine diet  Activity: Advance as tolerated. Pelvic rest for 6 weeks.   Outpatient follow up:6 weeks Follow up Appt:No future appointments. Follow up Visit:No Follow-up on file.  Postpartum contraception: Nexplanon  Newborn Data: Live born female  Birth Weight: 7 lb 9.9 oz (3456 g) APGAR: 8, 9  Baby  Feeding: Bottle Disposition:Currently awaiting input from child protective services regarding disposition   08/26/2016 Gorden HarmsMegan Kervin Bones, MD

## 2016-08-26 NOTE — Progress Notes (Signed)
Child and Family Team Meeting with Child Protective Services will be held at 9am on 08/27/16 in classroom 8.   

## 2016-08-26 NOTE — Discharge Instructions (Signed)

## 2016-08-29 LAB — HEPATITIS PANEL, ACUTE
Hep A IgM: NEGATIVE
Hep B C IgM: NEGATIVE
Hepatitis B Surface Ag: POSITIVE — AB

## 2016-10-07 ENCOUNTER — Ambulatory Visit: Payer: Medicaid Other | Admitting: Family

## 2016-11-28 ENCOUNTER — Encounter (HOSPITAL_COMMUNITY): Payer: Self-pay | Admitting: Emergency Medicine

## 2016-11-28 ENCOUNTER — Ambulatory Visit (HOSPITAL_COMMUNITY)
Admission: EM | Admit: 2016-11-28 | Discharge: 2016-11-28 | Disposition: A | Discharge status: Home / Self Care | Payer: Medicaid Other | Attending: Family Medicine | Admitting: Family Medicine

## 2016-11-28 DIAGNOSIS — S8391XS Sprain of unspecified site of right knee, sequela: Secondary | ICD-10-CM

## 2016-11-28 DIAGNOSIS — M7050 Other bursitis of knee, unspecified knee: Secondary | ICD-10-CM

## 2016-11-28 MED ORDER — KETOROLAC TROMETHAMINE 60 MG/2ML IM SOLN
INTRAMUSCULAR | Status: AC
  Filled 2016-11-28: qty 2

## 2016-11-28 MED ORDER — NAPROXEN 500 MG PO TABS: 500.0000 mg | ORAL_TABLET | Freq: Two times a day (BID) | ORAL | 0 refills | Status: DC

## 2016-11-28 MED ORDER — KETOROLAC TROMETHAMINE 60 MG/2ML IM SOLN
60.0000 mg | Freq: Once | INTRAMUSCULAR | Status: AC
  Administered 2016-11-28: 60 mg via INTRAMUSCULAR

## 2016-11-28 NOTE — ED Provider Notes (Signed)
CSN: [id number]     Arrival date & time 11/28/16  1524 History   First MD Initiated Contact with Patient 11/28/16 1551     Chief Complaint  Patient presents with  . Knee Pain   (Consider location/radiation/quality/duration/timing/severity/associated sxs/prior Treatment) Patient states she has pain in her right knee for 2 weeks.  She works at [employer] and she is on her feet all day.  She states she has more pain with squatting or walking up stairs.   The history is provided by the patient.  Knee Pain  Location:  Knee Time since incident:  2 weeks Injury: no   Knee location:  R knee Pain details:    Quality:  Aching   Severity:  Moderate   Onset quality:  Gradual   Duration:  2 weeks   Timing:  Constant Chronicity:  New Dislocation: no   Foreign body present:  No foreign bodies Tetanus status:  Unknown Prior injury to area:  No Relieved by:  Nothing Worsened by:  Nothing Ineffective treatments:  None tried   Past Medical History:  Diagnosis Date  . Anemia   . Asthma   . Bipolar 1 disorder (HCC)    hx cutting   . Bipolar disorder (HCC)   . Chlamydia   . Depression   . Gestational diabetes   . Heart murmur   . Hepatitis B antibody positive   . Mental disorder    depression and bipolar  . PONV (postoperative nausea and vomiting)   . Pregnancy induced hypertension   . Preterm labor   . S/P dilation and curettage    for TAB and ectopic   Past Surgical History:  Procedure Laterality Date  . DILATION AND CURETTAGE OF UTERUS    . LIVER BIOPSY    . SKIN GRAFT    . SMALL INTESTINE SURGERY     pt had intusseseption at 26 years old  . WISDOM TOOTH EXTRACTION     Family History  Problem Relation Age of Onset  . Adopted: Yes   Social History  Substance Use Topics  . Smoking status: Former Smoker    Quit date: 07/08/2012  . Smokeless tobacco: Never Used  . Alcohol use No   OB History    Gravida Para Term Preterm AB Living   SAB TAB Ectopic  Multiple Live Births   0 0 1 0 5     Review of Systems  Constitutional: Negative.   HENT: Negative.   Eyes: Negative.   Respiratory: Negative.   Cardiovascular: Negative.   Gastrointestinal: Negative.   Endocrine: Negative.   Genitourinary: Negative.   Musculoskeletal: Positive for arthralgias.  Allergic/Immunologic: Negative.   Neurological: Negative.   Hematological: Negative.   Psychiatric/Behavioral: Negative.     Allergies  Other  Home Medications   Prior to Admission medications   Medication Sig Start Date End Date Taking? Authorizing Provider  naproxen (NAPROSYN) 500 MG tablet Take 1 tablet (500 mg total) by mouth 2 (two) times daily with a meal. 11/28/16   Deatra Canter, FNP   Meds Ordered and Administered this Visit   Medications  ketorolac (TORADOL) injection 60 mg (60 mg Intramuscular Given 11/28/16 1612)    BP 132/87 (BP Location: Left Arm)   Pulse 60   Temp 97.9 F (36.6 C) (Oral)   Resp 18   SpO2 100%   Breastfeeding? No  No data found.   Physical Exam  Constitutional: She appears well-developed  and well-nourished.  HENT:  Head: Normocephalic and atraumatic.  Eyes: Conjunctivae and EOM are normal. Pupils are equal, round, and reactive to light.  Neck: Normal range of motion. Neck supple.  Cardiovascular: Normal rate, regular rhythm and normal heart sounds.   Pulmonary/Chest: Effort normal and breath sounds normal.  Musculoskeletal: She exhibits tenderness.  TTP right pre patellar bursae.  Nursing note and vitals reviewed.   Urgent Care Course     Procedures (including critical care time)  Labs Review Labs Reviewed - No data to display  Imaging Review No results found.   Visual Acuity Review  Right Eye Distance:   Left Eye Distance:   Bilateral Distance:    Right Eye Near:   Left Eye Near:    Bilateral Near:         MDM   1. Patellar bursitis, unspecified laterality   2. Sprain of right knee, unspecified ligament,  sequela    toradol  IM ACE wrap right knee Naprosyn  one po bid x 7 days #14  Apply ice for 2days and then go to Heat  Work note for 3 days  Follow up prn.    Deatra Canter, FNP 11/28/16 1620

## 2016-11-28 NOTE — ED Triage Notes (Signed)
The patient presented to the [facility] with a complaint of right knee pain, swelling and burning x 2 weeks. The patient denied any known injury.

## 2017-01-16 ENCOUNTER — Encounter (HOSPITAL_COMMUNITY): Payer: Self-pay | Admitting: Emergency Medicine

## 2017-01-16 ENCOUNTER — Emergency Department (HOSPITAL_COMMUNITY)
Admission: EM | Admit: 2017-01-16 | Discharge: 2017-01-16 | Disposition: A | Discharge status: Home / Self Care | Payer: Medicaid Other | Attending: Emergency Medicine | Admitting: Emergency Medicine

## 2017-01-16 DIAGNOSIS — K0889 Other specified disorders of teeth and supporting structures: Secondary | ICD-10-CM | POA: Insufficient documentation

## 2017-01-16 DIAGNOSIS — J45909 Unspecified asthma, uncomplicated: Secondary | ICD-10-CM | POA: Insufficient documentation

## 2017-01-16 DIAGNOSIS — Z87891 Personal history of nicotine dependence: Secondary | ICD-10-CM | POA: Diagnosis not present

## 2017-01-16 MED ORDER — OXYCODONE-ACETAMINOPHEN 5-325 MG PO TABS
1.0000 | ORAL_TABLET | Freq: Once | ORAL | Status: AC
  Administered 2017-01-16: 1 via ORAL
  Filled 2017-01-16: qty 1

## 2017-01-16 MED ORDER — NAPROXEN 500 MG PO TABS: 500.0000 mg | ORAL_TABLET | Freq: Two times a day (BID) | ORAL | 0 refills | Status: DC

## 2017-01-16 MED ORDER — PENICILLIN V POTASSIUM 500 MG PO TABS: 500.0000 mg | ORAL_TABLET | Freq: Four times a day (QID) | ORAL | 0 refills | Status: AC

## 2017-01-16 MED ORDER — BUPIVACAINE-EPINEPHRINE (PF) 0.5% -1:200000 IJ SOLN
1.8000 mL | Freq: Once | INTRAMUSCULAR | Status: AC
  Administered 2017-01-16: 1.8 mL
  Filled 2017-01-16: qty 1.8

## 2017-01-16 MED ORDER — HYDROCODONE-ACETAMINOPHEN 5-325 MG PO TABS: 1.0000 | ORAL_TABLET | ORAL | 0 refills | Status: DC | PRN

## 2017-01-16 MED ORDER — PENICILLIN V POTASSIUM 250 MG PO TABS
500.0000 mg | ORAL_TABLET | Freq: Once | ORAL | Status: AC
  Administered 2017-01-16: 500 mg via ORAL
  Filled 2017-01-16: qty 2

## 2017-01-16 NOTE — ED Triage Notes (Signed)
Pt having 10/10 dental pain on her right lower side, denies fever or chills.

## 2017-01-16 NOTE — ED Provider Notes (Signed)
[facility]-EMERGENCY DEPT Provider Note   CSN: [id number] Arrival date & time: 01/16/17  0509    History   Chief Complaint Chief Complaint  Patient presents with  . Dental Pain    HPI Lauren Moss is a 26 y.o. female.  Patient with dental pain x 4 days, last with similar symptoms ~4 months ago. No relief with home ibuprofen. Patient denies fevers.   The history is provided by the patient. No language interpreter was used.  Dental Pain   This is a recurrent problem. Episode onset: 4 days ago. The problem occurs constantly. The problem has been gradually worsening. The pain is at a severity of 10/10. The pain is severe. Treatments tried: ibuprofen. The treatment provided no relief.    Past Medical History:  Diagnosis Date  . Anemia   . Asthma   . Bipolar 1 disorder (HCC)    hx cutting   . Bipolar disorder (HCC)   . Chlamydia   . Depression   . Gestational diabetes   . Heart murmur   . Hepatitis B antibody positive   . Mental disorder    depression and bipolar  . PONV (postoperative nausea and vomiting)   . Pregnancy induced hypertension   . Preterm labor   . S/P dilation and curettage    for TAB and ectopic    Patient Active Problem List   Diagnosis Date Noted  . SVD (spontaneous vaginal delivery) 08/26/2016  . Normal labor 08/23/2016  . Marijuana use 03/28/2016  . Supervision of high risk pregnancy in second trimester 10/12/2012  . History of gestational diabetes in prior pregnancy, currently pregnant in second trimester 04/09/2011  . Depression 04/01/2011  . Bipolar disorder (HCC) 04/01/2011  . Hep B complicating pregnancy 04/01/2011    Past Surgical History:  Procedure Laterality Date  . DILATION AND CURETTAGE OF UTERUS    . LIVER BIOPSY    . SKIN GRAFT    . SMALL INTESTINE SURGERY     pt had intusseseption at 26 years old  . WISDOM TOOTH EXTRACTION      OB History    Gravida Para Term Preterm AB Living   6 5 4 1 1 5    SAB TAB Ectopic Multiple  Live Births   0 0 1 0 5       Home Medications    Prior to Admission medications   Medication Sig Start Date End Date Taking? Authorizing Provider  naproxen (NAPROSYN) 500 MG tablet Take 1 tablet (500 mg total) by mouth 2 (two) times daily with a meal. 11/28/16   [place], Anselm PancoastWilliam J, FNP    Family History Family History  Problem Relation Age of Onset  . Adopted: Yes    Social History Social History  Substance Use Topics  . Smoking status: Former Smoker    Quit date: 07/08/2012  . Smokeless tobacco: Never Used  . Alcohol use No     Allergies   Other   Review of Systems Review of Systems Ten systems reviewed and are negative for acute change, except as noted in the HPI.    Physical Exam Updated Vital Signs BP 140/88 (BP Location: Right Arm)   Pulse 79   Temp 97.9 F (36.6 C) (Oral)   Resp 20   SpO2 96%   Physical Exam  Constitutional: She is oriented to person, place, and time. She appears well-developed and well-nourished. No distress.  Patient anxious, tearful  HENT:  Head: Normocephalic and atraumatic.  Mouth/Throat: Oropharynx is clear  and moist and mucous membranes are normal. No oral lesions. No trismus in the jaw.    No trismus. Tolerating secretions without difficulty. No gingival swelling or fluctuance.  Eyes: Conjunctivae and EOM are normal. No scleral icterus.  Neck: Normal range of motion.  No meningismus  Pulmonary/Chest: Effort normal. No respiratory distress.  Musculoskeletal: Normal range of motion.  Neurological: She is alert and oriented to person, place, and time.  Skin: Skin is warm and dry. No rash noted. She is not diaphoretic. No erythema. No pallor.  Psychiatric: She has a normal mood and affect. Her behavior is normal.  Nursing note and vitals reviewed.    ED Treatments / Results  Labs (all labs ordered are listed, but only abnormal results are displayed) Labs Reviewed - No data to display  EKG  EKG Interpretation None        Radiology No results found.  Procedures Dental Block Date/Time: 01/16/2017 6:08 AM Performed by: Antony Madura Authorized by: Antony Madura   Consent:    Consent obtained:  Verbal and emergent situation   Consent given by:  Patient   Risks discussed:  Infection, swelling, unsuccessful block and pain   Alternatives discussed:  No treatment and delayed treatment Indications:    Indications: dental pain   Location:    Anesthesia block type: inferior alveolar and localized. Procedure details (see MAR for exact dosages):    Syringe type:  Aspirating dental syringe   Needle gauge: 22G.   Anesthetic injected:  Bupivacaine 0.5% WITH epi   Injection procedure:  Anatomic landmarks identified, anatomic landmarks palpated and negative aspiration for blood Post-procedure details:    Outcome:  Pain relieved   Patient tolerance of procedure:  Tolerated well, no immediate complications   (including critical care time)  Medications Ordered in ED Medications  penicillin v potassium (VEETID) tablet 500 mg (not administered)  oxyCODONE-acetaminophen (PERCOCET/ROXICET) 5-325 MG per tablet 1 tablet (not administered)  bupivacaine-epinephrine (MARCAINE W/ EPI) 0.5% -1:200000 injection 1.8 mL (not administered)     Initial Impression / Assessment and Plan / ED Course  I have reviewed the triage vital signs and the nursing notes.  Pertinent labs & imaging results that were available during my care of the patient were reviewed by me and considered in my medical decision making (see chart for details).     Patient with toothache. No gross abscess. Exam unconcerning for Ludwig's angina or spread of infection. Will treat with penicillin and pain medicine. Urged patient to follow-up with dentist. Patient discharged in stable condition with no unaddressed concerns.   Final Clinical Impressions(s) / ED Diagnoses   Final diagnoses:  Pain, dental    New Prescriptions New Prescriptions   No  medications on file     Antony Madura, Cordelia Poche 01/16/17 4098    Melene Plan, DO 01/16/17 [phone]

## 2017-07-01 ENCOUNTER — Emergency Department (HOSPITAL_COMMUNITY)
Admission: EM | Admit: 2017-07-01 | Discharge: 2017-07-02 | Disposition: A | Discharge status: Home / Self Care | Payer: Medicaid Other | Attending: Emergency Medicine | Admitting: Emergency Medicine

## 2017-07-01 ENCOUNTER — Emergency Department (HOSPITAL_COMMUNITY): Payer: Medicaid Other

## 2017-07-01 DIAGNOSIS — Z87891 Personal history of nicotine dependence: Secondary | ICD-10-CM | POA: Diagnosis not present

## 2017-07-01 DIAGNOSIS — R0602 Shortness of breath: Secondary | ICD-10-CM | POA: Diagnosis present

## 2017-07-01 DIAGNOSIS — J4541 Moderate persistent asthma with (acute) exacerbation: Secondary | ICD-10-CM | POA: Diagnosis not present

## 2017-07-01 DIAGNOSIS — Z79899 Other long term (current) drug therapy: Secondary | ICD-10-CM | POA: Diagnosis not present

## 2017-07-01 MED ORDER — ALBUTEROL (5 MG/ML) CONTINUOUS INHALATION SOLN
10.0000 mg/h | INHALATION_SOLUTION | Freq: Once | RESPIRATORY_TRACT | Status: AC
  Administered 2017-07-02: 10 mg/h via RESPIRATORY_TRACT
  Filled 2017-07-01: qty 20

## 2017-07-01 MED ORDER — MAGNESIUM SULFATE 50 % IJ SOLN: 1.0000 g | Freq: Once | INTRAMUSCULAR | Status: DC

## 2017-07-01 MED ORDER — MAGNESIUM SULFATE IN D5W 1-5 GM/100ML-% IV SOLN
1.0000 g | Freq: Once | INTRAVENOUS | Status: AC
  Administered 2017-07-02: 1 g via INTRAVENOUS
  Filled 2017-07-01: qty 100

## 2017-07-01 MED ORDER — METHYLPREDNISOLONE SODIUM SUCC 125 MG IJ SOLR
125.0000 mg | Freq: Once | INTRAMUSCULAR | Status: AC
Start: 2017-07-01 — End: 2017-07-01
  Administered 2017-07-01: 125 mg via INTRAVENOUS
  Filled 2017-07-01: qty 2

## 2017-07-01 MED ORDER — LORAZEPAM 2 MG/ML IJ SOLN
1.0000 mg | Freq: Once | INTRAMUSCULAR | Status: AC
  Administered 2017-07-01: 1 mg via INTRAVENOUS

## 2017-07-01 MED ORDER — ALBUTEROL SULFATE (2.5 MG/3ML) 0.083% IN NEBU
INHALATION_SOLUTION | RESPIRATORY_TRACT | Status: AC
  Filled 2017-07-01: qty 3

## 2017-07-01 MED ORDER — LORAZEPAM 2 MG/ML IJ SOLN
INTRAMUSCULAR | Status: DC
Start: 2017-07-01 — End: 2017-07-02
  Filled 2017-07-01: qty 1

## 2017-07-01 NOTE — ED Provider Notes (Signed)
[facility]-EMERGENCY DEPT Provider Note   CSN: [id number] Arrival date & time: 07/01/17  2322     History   Chief Complaint Chief Complaint  Patient presents with  . Shortness of Breath    HPI Lauren Moss is a 26 y.o. female.  Patient is a 26 year old female with past medical history of bipolar disorder, asthma presenting for evaluation of shortness of breath.  She apparently has been sick the last 2 days with chest congestion and cough.  This evening she applied Vicks to her chest prior to going to sleep.  When she woke up she was having severe pressure in her chest and difficulty breathing.  She began wheezing and hyperventilating.  Her boyfriend brought her here by private vehicle for evaluation.   The history is provided by the patient.  Shortness of Breath  This is a recurrent problem. The average episode lasts 1 hour. The problem occurs continuously.The current episode started less than 1 hour ago. The problem has been rapidly worsening. Associated symptoms include a fever, cough and wheezing. Treatments tried: Albuterol nebulizer. The treatment provided no relief. Associated medical issues include asthma.    Past Medical History:  Diagnosis Date  . Anemia   . Asthma   . Bipolar 1 disorder (HCC)    hx cutting   . Bipolar disorder (HCC)   . Chlamydia   . Depression   . Gestational diabetes   . Heart murmur   . Hepatitis B antibody positive   . Mental disorder    depression and bipolar  . PONV (postoperative nausea and vomiting)   . Pregnancy induced hypertension   . Preterm labor   . S/P dilation and curettage    for TAB and ectopic    Patient Active Problem List   Diagnosis Date Noted  . SVD (spontaneous vaginal delivery) 08/26/2016  . Normal labor 08/23/2016  . Marijuana use 03/28/2016  . Supervision of high risk pregnancy in second trimester 10/12/2012  . History of gestational diabetes in prior pregnancy, currently pregnant in  second trimester 04/09/2011  . Depression 04/01/2011  . Bipolar disorder (HCC) 04/01/2011  . Hep B complicating pregnancy 04/01/2011    Past Surgical History:  Procedure Laterality Date  . DILATION AND CURETTAGE OF UTERUS    . LIVER BIOPSY    . SKIN GRAFT    . SMALL INTESTINE SURGERY     pt had intusseseption at 26 years old  . WISDOM TOOTH EXTRACTION      OB History    Gravida Para Term Preterm AB Living   6 5 4 1 1 5    SAB TAB Ectopic Multiple Live Births   0 0 1 0 5       Home Medications    Prior to Admission medications   Medication Sig Start Date End Date Taking? Authorizing Provider  HYDROcodone-acetaminophen (NORCO/VICODIN) 5-325 MG tablet Take 1 tablet by mouth every 4 (four) hours as needed for severe pain. 01/16/17   Antony MaduraHumes, Kelly, PA-C  naproxen (NAPROSYN) 500 MG tablet Take 1 tablet (500 mg total) by mouth 2 (two) times daily with a meal. 01/16/17   Antony MaduraHumes, Kelly, PA-C    Family History Family History  Adopted: Yes    Social History Social History   Tobacco Use  . Smoking status: Former Smoker    Last attempt to quit: 07/08/2012    Years since quitting: 4.9  . Smokeless tobacco: Never Used  Substance Use Topics  . Alcohol use: No  .  Drug use: No     Allergies   Other   Review of Systems Review of Systems  Constitutional: Positive for fever.  Respiratory: Positive for cough, shortness of breath and wheezing.   All other systems reviewed and are negative.    Physical Exam Updated Vital Signs There were no vitals taken for this visit.  Physical Exam  Constitutional: She is oriented to person, place, and time. She appears well-developed and well-nourished. No distress.  Patient appears extremely anxious.  She is hyperventilating.  HENT:  Head: Normocephalic and atraumatic.  Neck: Normal range of motion. Neck supple.  Cardiovascular: Normal rate and regular rhythm. Exam reveals no gallop and no friction rub.  No murmur  heard. Pulmonary/Chest: Effort normal and breath sounds normal. No respiratory distress. She has no wheezes.  Lungs sound clear with transmitted upper respiratory sounds.  Abdominal: Soft. Bowel sounds are normal. She exhibits no distension. There is no tenderness.  Musculoskeletal: Normal range of motion.  Neurological: She is alert and oriented to person, place, and time.  Skin: Skin is warm and dry. She is not diaphoretic.  Nursing note and vitals reviewed.    ED Treatments / Results  Labs (all labs ordered are listed, but only abnormal results are displayed) Labs Reviewed  BASIC METABOLIC PANEL  CBC WITH DIFFERENTIAL/PLATELET  I-STAT BETA HCG BLOOD, ED ([facility], [facility], [facility] ONLY)  I-STAT TROPONIN, ED    EKG  EKG Interpretation None       Radiology No results found.  Procedures Procedures (including critical care time)  Medications Ordered in ED Medications  albuterol (PROVENTIL) (2.5 MG/3ML) 0.083% nebulizer solution (not administered)  albuterol (PROVENTIL,VENTOLIN) solution continuous neb (not administered)  methylPREDNISolone sodium succinate (SOLU-MEDROL) 125 mg/2 mL injection 125 mg (not administered)  LORazepam (ATIVAN) 2 MG/ML injection (not administered)  magnesium sulfate IVPB 1 g 100 mL (not administered)  LORazepam (ATIVAN) injection 1 mg (1 mg Intravenous Given 07/01/17 2350)     Initial Impression / Assessment and Plan / ED Course  I have reviewed the triage vital signs and the nursing notes.  Pertinent labs & imaging results that were available during my care of the patient were reviewed by me and considered in my medical decision making (see chart for details).  Patient presents with shortness of breath.  She has a history of asthma.  Upon presentation, she was extremely anxious and hyperventilating.  She appeared as though she was having a panic attack.  She was given Ativan along with appropriate medications for an asthma exacerbation.  She had an  hour-long nebulizer treatment, Solu-Medrol, magnesium, and has since improved.  She is now ambulatory in the department with oxygen saturations of 98%.  She will be discharged with prednisone and continued use of her nebulizer.  Final Clinical Impressions(s) / ED Diagnoses   Final diagnoses:  None    ED Discharge Orders    None       Geoffery Lyonselo, Tawana Pasch, MD 07/02/17 0225

## 2017-07-02 ENCOUNTER — Encounter (HOSPITAL_COMMUNITY): Payer: Self-pay | Admitting: Emergency Medicine

## 2017-07-02 LAB — BASIC METABOLIC PANEL
ANION GAP: 12 (ref 5–15)
BUN: 5 mg/dL — ABNORMAL LOW (ref 6–20)
CALCIUM: 9.2 mg/dL (ref 8.9–10.3)
CO2: 19 mmol/L — ABNORMAL LOW (ref 22–32)
Chloride: 106 mmol/L (ref 101–111)
Creatinine, Ser: 0.93 mg/dL (ref 0.44–1.00)
GLUCOSE: 118 mg/dL — AB (ref 65–99)
POTASSIUM: 3.6 mmol/L (ref 3.5–5.1)
Sodium: 137 mmol/L (ref 135–145)

## 2017-07-02 LAB — CBC WITH DIFFERENTIAL/PLATELET
BASOS ABS: 0.1 10*3/uL (ref 0.0–0.1)
BASOS PCT: 1 %
Eosinophils Absolute: 0 10*3/uL (ref 0.0–0.7)
Eosinophils Relative: 1 %
HEMATOCRIT: 41.8 % (ref 36.0–46.0)
HEMOGLOBIN: 14.5 g/dL (ref 12.0–15.0)
LYMPHS PCT: 53 %
Lymphs Abs: 2.7 10*3/uL (ref 0.7–4.0)
MCH: 29.5 pg (ref 26.0–34.0)
MCHC: 34.7 g/dL (ref 30.0–36.0)
MCV: 85.1 fL (ref 78.0–100.0)
MONO ABS: 0.7 10*3/uL (ref 0.1–1.0)
Monocytes Relative: 15 %
NEUTROS ABS: 1.5 10*3/uL — AB (ref 1.7–7.7)
NEUTROS PCT: 30 %
Platelets: 221 10*3/uL (ref 150–400)
RBC: 4.91 MIL/uL (ref 3.87–5.11)
RDW: 14.1 % (ref 11.5–15.5)
WBC: 5 10*3/uL (ref 4.0–10.5)

## 2017-07-02 LAB — I-STAT TROPONIN, ED: TROPONIN I, POC: 0 ng/mL (ref 0.00–0.08)

## 2017-07-02 LAB — I-STAT BETA HCG BLOOD, ED (MC, WL, AP ONLY): I-stat hCG, quantitative: <5 m[IU]/mL (ref <5)

## 2017-07-02 MED ORDER — ALBUTEROL SULFATE HFA 108 (90 BASE) MCG/ACT IN AERS
2.0000 | INHALATION_SPRAY | RESPIRATORY_TRACT | Status: DC
  Administered 2017-07-02: 2 via RESPIRATORY_TRACT
  Filled 2017-07-02: qty 6.7

## 2017-07-02 MED ORDER — PREDNISONE 10 MG PO TABS: 20.0000 mg | ORAL_TABLET | Freq: Two times a day (BID) | ORAL | 0 refills | Status: DC

## 2017-07-02 NOTE — ED Triage Notes (Addendum)
Came in from home with complaint of  Asthma attack with SOB, anxiety and congestion. Pt. Stated that she has colds and cough  and fever x 2 days. Last night at 11 pm SOB is getting worst.

## 2017-07-02 NOTE — Discharge Instructions (Signed)
Prednisone as prescribed.  Albuterol nebulizer treatment every 4 hours as needed for wheezing.  Return to the emergency department if symptoms significantly worsen or change.

## 2017-07-02 NOTE — ED Notes (Signed)
EKG given to EDP,Delo,MD., for review. 

## 2018-04-25 ENCOUNTER — Inpatient Hospital Stay (HOSPITAL_COMMUNITY)
Admission: AD | Admit: 2018-04-25 | Discharge: 2018-04-26 | Disposition: A | Discharge status: Home / Self Care | Payer: Self-pay | Source: Ambulatory Visit | Attending: Obstetrics and Gynecology | Admitting: Obstetrics and Gynecology

## 2018-04-25 DIAGNOSIS — O209 Hemorrhage in early pregnancy, unspecified: Secondary | ICD-10-CM | POA: Insufficient documentation

## 2018-04-25 DIAGNOSIS — Z3A11 11 weeks gestation of pregnancy: Secondary | ICD-10-CM | POA: Insufficient documentation

## 2018-04-25 DIAGNOSIS — Z87891 Personal history of nicotine dependence: Secondary | ICD-10-CM | POA: Insufficient documentation

## 2018-04-25 DIAGNOSIS — O418X10 Other specified disorders of amniotic fluid and membranes, first trimester, not applicable or unspecified: Secondary | ICD-10-CM

## 2018-04-25 DIAGNOSIS — O468X1 Other antepartum hemorrhage, first trimester: Secondary | ICD-10-CM

## 2018-04-25 DIAGNOSIS — O4692 Antepartum hemorrhage, unspecified, second trimester: Secondary | ICD-10-CM

## 2018-04-26 ENCOUNTER — Other Ambulatory Visit: Payer: Self-pay

## 2018-04-26 ENCOUNTER — Inpatient Hospital Stay (HOSPITAL_COMMUNITY): Payer: Self-pay

## 2018-04-26 ENCOUNTER — Encounter (HOSPITAL_COMMUNITY): Payer: Self-pay | Admitting: *Deleted

## 2018-04-26 DIAGNOSIS — O468X1 Other antepartum hemorrhage, first trimester: Secondary | ICD-10-CM

## 2018-04-26 DIAGNOSIS — O418X10 Other specified disorders of amniotic fluid and membranes, first trimester, not applicable or unspecified: Secondary | ICD-10-CM

## 2018-04-26 DIAGNOSIS — Z3A11 11 weeks gestation of pregnancy: Secondary | ICD-10-CM

## 2018-04-26 LAB — URINALYSIS, ROUTINE W REFLEX MICROSCOPIC
Bacteria, UA: NONE SEEN
Bilirubin Urine: NEGATIVE
Glucose, UA: NEGATIVE mg/dL
Ketones, ur: NEGATIVE mg/dL
Leukocytes, UA: NEGATIVE
NITRITE: NEGATIVE
PH: 6 (ref 5.0–8.0)
Protein, ur: NEGATIVE mg/dL
SPECIFIC GRAVITY, URINE: 1.018 (ref 1.005–1.030)

## 2018-04-26 LAB — POCT PREGNANCY, URINE: Preg Test, Ur: POSITIVE — AB

## 2018-04-26 NOTE — MAU Note (Signed)
Pt reports gush of blood at 10:30 pm. Pt denies pain. Denies recent intercourse.

## 2018-04-26 NOTE — MAU Provider Note (Signed)
Chief Complaint: Vaginal Bleeding   First Provider Initiated Contact with Patient 04/26/18 0120     SUBJECTIVE HPI: Lauren Moss is a 27 y.o. [id number] at 6746w6d by LMP who presents to [facility]missions reporting vaginal bleeding. Was lying down at home this evening around 10:30 pm when she had a gush of vaginal bleeding. Bleeding has continued but not as heavy. Denies abdominal pain, vaginal discharge, constipation, recent intercourse. Has not started prenatal care.    Past Medical History:  Diagnosis Date  . Anemia   . Asthma   . Bipolar 1 disorder (HCC)    hx cutting   . Chlamydia   . Depression   . Gestational diabetes   . Heart murmur   . Hepatitis B antibody positive   . Mental disorder    depression and bipolar  . PONV (postoperative nausea and vomiting)   . Pregnancy induced hypertension   . Preterm labor   . S/P dilation and curettage    for TAB and ectopic   OB History  Gravida Para Term Preterm AB Living  7 5 4 1 1 5   SAB TAB Ectopic Multiple Live Births  0 0 1 0 5    # Outcome Date GA Lbr Len/2nd Weight Sex Delivery Anes PTL Lv  7 Current           6 Term 08/24/16 3915w5d 02:40 / 00:28 3456 g M Vag-Spont EPI  LIV  5 Term 05/19/14    F Vag-Spont   LIV  4 Term 03/11/13 6781w4d -21:10 / 00:14 3175 g M Vag-Spont None  LIV  3 Term 05/20/11 5174w2d 08:53 / 00:32 3110 g M Vag-Spont EPI  LIV  2 Preterm 08/07/09 4622w0d  3130 g M Vag-Spont EPI Y LIV     Complications: Other Excessive Bleeding  1 Ectopic 2009 6558w0d            Birth Comments: right tube   Past Surgical History:  Procedure Laterality Date  . DILATION AND CURETTAGE OF UTERUS    . LIVER BIOPSY    . SKIN GRAFT    . SMALL INTESTINE SURGERY     pt had intusseseption at 27 years old  . WISDOM TOOTH EXTRACTION     Social History   Socioeconomic History  . Marital status: Legally Separated    Spouse name: Not on file  . Number of children: Not on file  . Years of education: Not on file  . Highest  education level: Not on file  Occupational History  . Not on file  Social Needs  . Financial resource strain: Not on file  . Food insecurity:    Worry: Not on file    Inability: Not on file  . Transportation needs:    Medical: Not on file    Non-medical: Not on file  Tobacco Use  . Smoking status: Former Smoker    Last attempt to quit: 07/08/2012    Years since quitting: 5.8  . Smokeless tobacco: Never Used  Substance and Sexual Activity  . Alcohol use: No  . Drug use: No  . Sexual activity: Yes    Birth control/protection: None  Lifestyle  . Physical activity:    Days per week: Not on file    Minutes per session: Not on file  . Stress: Not on file  Relationships  . Social connections:    Talks on phone: Not on file    Gets together: Not on file    Attends religious service:  Not on file    Active member of club or organization: Not on file    Attends meetings of clubs or organizations: Not on file    Relationship status: Not on file  . Intimate partner violence:    Fear of current or ex partner: Not on file    Emotionally abused: Not on file    Physically abused: Not on file    Forced sexual activity: Not on file  Other Topics Concern  . Not on file  Social History Narrative  . Not on file   Family History  Adopted: Yes   No current facility-administered medications on file prior to encounter.    Current Outpatient Medications on File Prior to Encounter  Medication Sig Dispense Refill  . lurasidone (LATUDA) 20 MG TABS tablet Take 60 mg by mouth daily.    . predniSONE (DELTASONE) 10 MG tablet Take 2 tablets (20 mg total) by mouth 2 (two) times daily with a meal. 20 tablet 0  . traZODone (DESYREL) 100 MG tablet Take 100 mg by mouth at bedtime as needed for sleep.     Allergies  Allergen Reactions  . Other Anaphylaxis    Curry Moss    I have reviewed patient's Past Medical Hx, Surgical Hx, Family Hx, Social Hx, medications and allergies.   Review of  Systems  Constitutional: Negative.   Gastrointestinal: Positive for nausea. Negative for abdominal pain, constipation, diarrhea and vomiting.  Genitourinary: Positive for vaginal bleeding. Negative for dysuria and vaginal discharge.    OBJECTIVE Patient Vitals for the past 24 hrs:  BP Temp Pulse Resp SpO2 Height Weight  04/26/18 0021 136/76 98.7 F (37.1 C) 91 18 99 % 5\' 3"  (1.6 m) 90.3 kg   Constitutional: Well-developed, well-nourished female in no acute distress.  Cardiovascular: normal rate & rhythm, no murmur Respiratory: normal rate and effort. Lung sounds clear throughout GI: Abd soft, non-tender, Pos BS x 4. No guarding or rebound tenderness MS: Extremities nontender, no edema, normal ROM Neurologic: Alert and oriented x 4.  GU:     SPECULUM EXAM: NEFG, moderate amount of dark red blood cleared out   with 4 fox swabs. Cervix pink & smooth.   BIMANUAL: No CMT. cervix closed; uterus enlarged ~12 wks   LAB RESULTS Results for orders placed or performed during the hospital encounter of 04/25/18 (from the past 24 hour(s))  Urinalysis, Routine w reflex microscopic     Status: Abnormal   Collection Time: 04/26/18 12:41 AM  Result Value Ref Range   Color, Urine YELLOW YELLOW   APPearance CLEAR CLEAR   Specific Gravity, Urine 1.018 1.005 - 1.030   pH 6.0 5.0 - 8.0   Glucose, UA NEGATIVE NEGATIVE mg/dL   Hgb urine dipstick SMALL (A) NEGATIVE   Bilirubin Urine NEGATIVE NEGATIVE   Ketones, ur NEGATIVE NEGATIVE mg/dL   Protein, ur NEGATIVE NEGATIVE mg/dL   Nitrite NEGATIVE NEGATIVE   Leukocytes, UA NEGATIVE NEGATIVE   WBC, UA 0-5 0 - 5 WBC/hpf   Bacteria, UA NONE SEEN NONE SEEN   Squamous Epithelial / LPF 0-5 0 - 5   Mucus PRESENT   Pregnancy, urine POC     Status: Abnormal   Collection Time: 04/26/18 12:46 AM  Result Value Ref Range   Preg Test, Ur POSITIVE (A) NEGATIVE    IMAGING No results found.  MAU COURSE Orders Placed This Encounter  Procedures  . US OB Comp  Less 14 Wks  . Urinalysis, Routine w reflex microscopic  .  Pregnancy, urine POC  . Discharge patient   No orders of the defined types were placed in this encounter.   MDM FHT 165 by doppler Cervix closed RH positive Ultrasound shows IUP measuring [redacted]w[redacted]d (EDD updated) & large [facility]  ASSESSMENT 1. Subchorionic hematoma in first trimester, single or unspecified fetus   2. Vaginal bleeding in pregnancy, second trimester   3. [redacted] weeks gestation of pregnancy     PLAN Discharge home in stable condition. Bleeding precautions Pelvic rest Start prenatal care  Follow-up Information    [facility]. Schedule an appointment as soon as possible for a visit.   Specialty:  Obstetrics and Gynecology Contact information: [address] [place] [address] [phone]         Allergies as of 04/26/2018      Reactions   Other Anaphylaxis   Lauren Moss      Medication List    STOP taking these medications   LATUDA 20 MG Tabs tablet Generic drug:  lurasidone   predniSONE 10 MG tablet Commonly known as:  DELTASONE   traZODone 100 MG tablet Commonly known as:  Georgeanna Lea, Denny Peon, NP 04/26/2018  2:16 AM

## 2018-04-26 NOTE — Discharge Instructions (Signed)
Subchorionic Hematoma °A subchorionic hematoma is a gathering of blood between the outer wall of the placenta and the inner wall of the womb (uterus). The placenta is the organ that connects the fetus to the wall of the uterus. The placenta performs the feeding, breathing (oxygen to the fetus), and waste removal (excretory work) of the fetus. °Subchorionic hematoma is the most common abnormality found on a result from ultrasonography done during the first trimester or early second trimester of pregnancy. If there has been little or no vaginal bleeding, early small hematomas usually shrink on their own and do not affect your baby or pregnancy. The blood is gradually absorbed over 1-2 weeks. When bleeding starts later in pregnancy or the hematoma is larger or occurs in an older pregnant woman, the outcome may not be as good. Larger hematomas may get bigger, which increases the chances for miscarriage. Subchorionic hematoma also increases the risk of premature detachment of the placenta from the uterus, preterm (premature) labor, and stillbirth. °Follow these instructions at home: °· Stay on bed rest if your health care provider recommends this. Although bed rest will not prevent more bleeding or prevent a miscarriage, your health care provider may recommend bed rest until you are advised otherwise. °· Avoid heavy lifting (more than 10 lb [4.5 kg]), exercise, sexual intercourse, or douching as directed by your health care provider. °· Keep track of the number of pads you use each day and how soaked (saturated) they are. Write down this information. °· Do not use tampons. °· Keep all follow-up appointments as directed by your health care provider. Your health care provider may ask you to have follow-up blood tests or ultrasound tests or both. °Get help right away if: °· You have severe cramps in your stomach, back, abdomen, or pelvis. °· You have a fever. °· You pass large clots or tissue. Save any tissue for your  health care provider to look at. °· Your bleeding increases or you become lightheaded, feel weak, or have fainting episodes. °This information is not intended to replace advice given to you by your health care provider. Make sure you discuss any questions you have with your health care provider. °Document Released: 11/05/2006 Document Revised: 12/27/2015 Document Reviewed: 02/17/2013 °Elsevier Interactive Patient Education © 2017 [employer] ° °

## 2018-05-11 ENCOUNTER — Encounter: Payer: Self-pay | Admitting: Obstetrics & Gynecology

## 2018-05-11 ENCOUNTER — Encounter: Payer: Medicaid Other | Admitting: Student

## 2018-05-11 ENCOUNTER — Encounter: Payer: Medicaid Other | Admitting: Obstetrics & Gynecology

## 2018-05-11 NOTE — BH Specialist Note (Deleted)
Integrated Behavioral Health Initial Visit  MRN: [id number] Name: Lauren Moss  Number of Integrated Behavioral Health Clinician visits:: 1/6 Session Start time: ***  Session End time: *** Total time: {IBH Total Time:21014050}  Type of Service: Integrated Behavioral Health- Individual/Family Interpretor:No. Interpretor Name and Language: n/a   Warm Hand Off Completed.       SUBJECTIVE: Lauren Moss is a 27 y.o. female accompanied by {CHL AMB ACCOMPANIED EX:5284132440} Patient was referred by Lauren Rosenthal, MD for Initial OB introduction to integrated behavioral health services . Patient reports the following symptoms/concerns: *** Duration of problem: ***; Severity of problem: {Mild/Moderate/Severe:20260}  OBJECTIVE: Mood: {BHH MOOD:22306} and Affect: {BHH AFFECT:22307} Risk of harm to self or others: {CHL AMB BH Suicide Current Mental Status:21022748}  LIFE CONTEXT: Family and Social: *** School/Work: *** Self-Care: *** Life Changes: Current pregnancy ***  GOALS ADDRESSED: Patient will: 1. Reduce symptoms of: {IBH Symptoms:21014056} 2. Increase knowledge and/or ability of: {IBH Patient Tools:21014057}  3. Demonstrate ability to: {IBH Goals:21014053}  INTERVENTIONS: Interventions utilized: {IBH Interventions:21014054}  Standardized Assessments completed: GAD-7 and PHQ 9 ***  ASSESSMENT: Patient currently experiencing Supervision of *** pregnancy, ***.   Patient may benefit from Initial OB introduction to integrated behavioral health services .  PLAN: 1. Follow up with behavioral health clinician on : *** 2. Behavioral recommendations: *** 3. Referral(s): {IBH Referrals:21014055} 4. "From scale of 1-10, how likely are you to follow plan?": ***  Lauren Lips, LCSW  .***

## 2018-06-27 ENCOUNTER — Inpatient Hospital Stay (HOSPITAL_COMMUNITY)
Admission: AD | Admit: 2018-06-27 | Discharge: 2018-06-28 | Disposition: A | Discharge status: Home / Self Care | Payer: Medicaid Other | Source: Ambulatory Visit | Attending: Obstetrics & Gynecology | Admitting: Obstetrics & Gynecology

## 2018-06-27 ENCOUNTER — Encounter (HOSPITAL_COMMUNITY): Payer: Self-pay | Admitting: *Deleted

## 2018-06-27 DIAGNOSIS — O26892 Other specified pregnancy related conditions, second trimester: Secondary | ICD-10-CM | POA: Insufficient documentation

## 2018-06-27 DIAGNOSIS — Z3A20 20 weeks gestation of pregnancy: Secondary | ICD-10-CM

## 2018-06-27 DIAGNOSIS — O26899 Other specified pregnancy related conditions, unspecified trimester: Secondary | ICD-10-CM

## 2018-06-27 DIAGNOSIS — Z87891 Personal history of nicotine dependence: Secondary | ICD-10-CM | POA: Insufficient documentation

## 2018-06-27 DIAGNOSIS — R102 Pelvic and perineal pain: Secondary | ICD-10-CM | POA: Insufficient documentation

## 2018-06-27 NOTE — MAU Note (Signed)
Pt reports sharp, pelvic pain that she has had since she found out she had a subchorionic hemorrhage. States the pain got worse tonight. Tried taking a bath-did not help. Pt states pain hurts worse with movement. Pt denies vaginal bleeding or discharge.

## 2018-06-28 DIAGNOSIS — Z87891 Personal history of nicotine dependence: Secondary | ICD-10-CM | POA: Diagnosis not present

## 2018-06-28 DIAGNOSIS — R102 Pelvic and perineal pain: Secondary | ICD-10-CM | POA: Diagnosis not present

## 2018-06-28 DIAGNOSIS — O26892 Other specified pregnancy related conditions, second trimester: Secondary | ICD-10-CM

## 2018-06-28 DIAGNOSIS — Z3A20 20 weeks gestation of pregnancy: Secondary | ICD-10-CM | POA: Diagnosis not present

## 2018-06-28 LAB — URINALYSIS, ROUTINE W REFLEX MICROSCOPIC
GLUCOSE, UA: NEGATIVE mg/dL
Hgb urine dipstick: NEGATIVE
KETONES UR: 5 mg/dL — AB
Leukocytes, UA: NEGATIVE
NITRITE: NEGATIVE
PH: 5 (ref 5.0–8.0)
PROTEIN: 30 mg/dL — AB
Specific Gravity, Urine: 1.031 — ABNORMAL HIGH (ref 1.005–1.030)

## 2018-06-28 NOTE — Discharge Instructions (Signed)
[facility]  ° ° °[facility]       Phone: [phone] ° °[facility] Phone: [phone] ° °[facility]  Phone: [phone] ° °[facility]  Phone: [phone] ° °[facility]  Phone: [phone] ° °[facility]       Phone: [phone] ° °[facility]    Phone: [phone]  ° °[facility]    Phone: [phone] ° °[facility]    Phone: [phone] ° °[facility]    Phone: [phone] ° °[facility]    Phone: [phone] ° °[facility]       Phone: [phone]  ° °[facility]  Phone: [phone] ° °[facility]    Phone: [phone] ° °[facility]   Phone: [phone] ° °[facility]      Phone: [phone] ° °[facility]    Phone: [phone] ° °Safe Medications in Pregnancy  ° °Acne: °Benzoyl Peroxide °Salicylic Acid ° °Backache/Headache: °Tylenol: 2 regular strength every 4 hours OR °             2 Extra strength every 6 hours ° °Colds/Coughs/Allergies: °Benadryl (alcohol free) 25 mg every 6 hours as needed °Breath right strips °Claritin °Cepacol throat lozenges °Chloraseptic throat spray °Cold-Eeze- up to three times per day °Cough drops, alcohol free °Flonase (by prescription only) °Guaifenesin °Mucinex °Robitussin DM (plain only, alcohol free) °Saline nasal spray/drops °Sudafed (pseudoephedrine) & Actifed ** use only after [redacted] weeks gestation and if you do not have high blood pressure °Tylenol °Vicks Vaporub °Zinc lozenges °Zyrtec  ° °Constipation: °Colace °Ducolax suppositories °Fleet enema °Glycerin suppositories °Metamucil °Milk of  magnesia °Miralax °Senokot °Smooth move tea ° °Diarrhea: °Kaopectate °Imodium A-D ° °*NO pepto Bismol ° °Hemorrhoids: °Anusol °Anusol HC °Preparation H °Tucks ° °Indigestion: °Tums °Maalox °Mylanta °Zantac  °Pepcid ° °Insomnia: °Benadryl (alcohol free) 25mg every 6 hours as needed °Tylenol PM °Unisom, no Gelcaps ° °Leg Cramps: °Tums °MagGel ° °Nausea/Vomiting:  °Bonine °Dramamine °Emetrol °Ginger extract °Sea bands °Meclizine  °Nausea medication to take during pregnancy:  °Unisom (doxylamine succinate 25 mg tablets) Take one tablet daily at bedtime. If symptoms are not adequately controlled, the dose can be increased to a maximum recommended dose of two tablets daily (1/2 tablet in the morning, 1/2 tablet mid-afternoon and one at bedtime). °Vitamin B6 100mg tablets. Take one tablet twice a day (up to 200 mg per day). ° °Skin Rashes: °Aveeno products °Benadryl cream or 25mg every 6 hours as needed °Calamine Lotion °1% cortisone cream ° °Yeast infection: °Gyne-lotrimin 7 °Monistat 7 ° ° °**If taking multiple medications, please check labels to avoid duplicating the same active ingredients °**take medication as directed on the label °** Do not exceed 4000 mg of tylenol in 24 hours °**Do not take medications that contain aspirin or ibuprofen ° ° ° ° °

## 2018-06-28 NOTE — MAU Provider Note (Signed)
History     CSN: [id number]  Arrival date and time: 06/27/18 2312   First Provider Initiated Contact with Patient 06/28/18 0005     Chief Complaint  Patient presents with  . Pelvic Pain   HPI Lauren Moss is a 27 y.o. [id number] at 2879w2d who presents with sharp lower abdominal pain that is worse with movement. She states this has been ongoing for several weeks. She rates the pain a 5/10 and tried a bath with no relief. Has not tried any medication for the pain. She denies any vaginal bleeding or leaking of fluid. Reports intermittent fetal movement.   OB History    Gravida  7   Para  5   Term  4   Preterm  1   AB  1   Living  5     SAB  0   TAB  0   Ectopic  1   Multiple  0   Live Births  5           Past Medical History:  Diagnosis Date  . Anemia   . Asthma   . Bipolar 1 disorder (HCC)    hx cutting   . Chlamydia   . Depression   . Gestational diabetes   . Heart murmur   . Hepatitis B antibody positive   . Mental disorder    depression and bipolar  . PONV (postoperative nausea and vomiting)   . Pregnancy induced hypertension   . Preterm labor   . S/P dilation and curettage    for TAB and ectopic    Past Surgical History:  Procedure Laterality Date  . DILATION AND CURETTAGE OF UTERUS    . LIVER BIOPSY    . MOUTH SURGERY    . SKIN GRAFT    . SMALL INTESTINE SURGERY     pt had intusseseption at 27 years old  . WISDOM TOOTH EXTRACTION      Family History  Adopted: Yes    Social History   Tobacco Use  . Smoking status: Former Smoker    Last attempt to quit: 07/08/2012    Years since quitting: 5.9  . Smokeless tobacco: Never Used  Substance Use Topics  . Alcohol use: No  . Drug use: No    Allergies:  Allergies  Allergen Reactions  . Other Anaphylaxis    Curry seasoning    Medications Prior to Admission  Medication Sig Dispense Refill Last Dose  . multivitamin (VIT W/EXTRA C) CHEW chewable tablet Chew 1 tablet by mouth  daily.   06/27/2018 at Unknown time    Review of Systems  Constitutional: Negative.  Negative for fatigue and fever.  HENT: Negative.   Respiratory: Negative.  Negative for shortness of breath.   Cardiovascular: Negative.  Negative for chest pain.  Gastrointestinal: Positive for abdominal pain. Negative for constipation, diarrhea, nausea and vomiting.  Genitourinary: Negative.  Negative for dysuria, vaginal bleeding and vaginal discharge.  Neurological: Negative.  Negative for dizziness and headaches.   Physical Exam   Blood pressure 130/62, pulse (!) 108, temperature 98.7 F (37.1 C), temperature source Oral, resp. rate 18, height 5\' 3"  (1.6 m), weight 91.2 kg, last menstrual period 01/05/2018, SpO2 98 %, unknown if currently breastfeeding.  Physical Exam  Nursing note and vitals reviewed. Constitutional: She is oriented to person, place, and time. She appears well-developed and well-nourished. No distress.  HENT:  Head: Normocephalic.  Eyes: Pupils are equal, round, and reactive to light.  Cardiovascular: Normal  rate, regular rhythm and normal heart sounds.  Respiratory: Effort normal and breath sounds normal. No respiratory distress.  GI: Soft. Bowel sounds are normal. She exhibits no distension. There is no tenderness. There is no rebound and no guarding.  Neurological: She is alert and oriented to person, place, and time.  Skin: Skin is warm and dry.  Psychiatric: She has a normal mood and affect. Her behavior is normal. Judgment and thought content normal.   FHT: 166 bpm  Dilation: Closed Effacement (%): Thick Cervical Position: Posterior Exam by:: Ma Hillock CNM  MAU Course  Procedures Results for orders placed or performed during the hospital encounter of 06/27/18 (from the past 24 hour(s))  Urinalysis, Routine w reflex microscopic     Status: Abnormal   Collection Time: 06/28/18 12:23 AM  Result Value Ref Range   Color, Urine AMBER (A) YELLOW   APPearance HAZY (A)  CLEAR   Specific Gravity, Urine 1.031 (H) 1.005 - 1.030   pH 5.0 5.0 - 8.0   Glucose, UA NEGATIVE NEGATIVE mg/dL   Hgb urine dipstick NEGATIVE NEGATIVE   Bilirubin Urine SMALL (A) NEGATIVE   Ketones, ur 5 (A) NEGATIVE mg/dL   Protein, ur 30 (A) NEGATIVE mg/dL   Nitrite NEGATIVE NEGATIVE   Leukocytes, UA NEGATIVE NEGATIVE   RBC / HPF 0-5 0 - 5 RBC/hpf   WBC, UA 0-5 0 - 5 WBC/hpf   Bacteria, UA RARE (A) NONE SEEN   Squamous Epithelial / LPF 0-5 0 - 5   Mucus PRESENT    Ca Oxalate Crys, UA PRESENT    MDM UA  Assessment and Plan   1. Pain of round ligament during pregnancy   2. [redacted] weeks gestation of pregnancy    -Discharge home in stable condition -Encouraged patient to increase PO hydration and wear a pregnancy support belt -Preterm labor precautions discussed -Patient advised to follow-up with OB of choice to start prenatal care -Patient may return to MAU as needed or if her condition were to change or worsen   Rolm Bookbinder CNM 06/28/2018, 1:00 AM

## 2018-07-29 ENCOUNTER — Other Ambulatory Visit: Payer: Self-pay

## 2018-07-29 ENCOUNTER — Inpatient Hospital Stay (HOSPITAL_COMMUNITY)
Admission: AD | Admit: 2018-07-29 | Discharge: 2018-07-29 | Disposition: A | Discharge status: Home / Self Care | Payer: Medicaid Other | Attending: Obstetrics & Gynecology | Admitting: Obstetrics & Gynecology

## 2018-07-29 ENCOUNTER — Inpatient Hospital Stay (HOSPITAL_COMMUNITY): Payer: Medicaid Other

## 2018-07-29 DIAGNOSIS — O212 Late vomiting of pregnancy: Secondary | ICD-10-CM | POA: Insufficient documentation

## 2018-07-29 DIAGNOSIS — Z87891 Personal history of nicotine dependence: Secondary | ICD-10-CM | POA: Insufficient documentation

## 2018-07-29 DIAGNOSIS — R0682 Tachypnea, not elsewhere classified: Secondary | ICD-10-CM | POA: Diagnosis not present

## 2018-07-29 DIAGNOSIS — O219 Vomiting of pregnancy, unspecified: Secondary | ICD-10-CM

## 2018-07-29 DIAGNOSIS — F319 Bipolar disorder, unspecified: Secondary | ICD-10-CM | POA: Insufficient documentation

## 2018-07-29 DIAGNOSIS — R197 Diarrhea, unspecified: Secondary | ICD-10-CM | POA: Insufficient documentation

## 2018-07-29 DIAGNOSIS — R6883 Chills (without fever): Secondary | ICD-10-CM | POA: Diagnosis present

## 2018-07-29 DIAGNOSIS — R509 Fever, unspecified: Secondary | ICD-10-CM | POA: Diagnosis present

## 2018-07-29 DIAGNOSIS — Z3A24 24 weeks gestation of pregnancy: Secondary | ICD-10-CM | POA: Diagnosis not present

## 2018-07-29 DIAGNOSIS — O99512 Diseases of the respiratory system complicating pregnancy, second trimester: Secondary | ICD-10-CM | POA: Diagnosis not present

## 2018-07-29 DIAGNOSIS — O99342 Other mental disorders complicating pregnancy, second trimester: Secondary | ICD-10-CM | POA: Insufficient documentation

## 2018-07-29 DIAGNOSIS — O9989 Other specified diseases and conditions complicating pregnancy, childbirth and the puerperium: Secondary | ICD-10-CM | POA: Insufficient documentation

## 2018-07-29 DIAGNOSIS — O26892 Other specified pregnancy related conditions, second trimester: Secondary | ICD-10-CM

## 2018-07-29 DIAGNOSIS — Z79899 Other long term (current) drug therapy: Secondary | ICD-10-CM | POA: Diagnosis not present

## 2018-07-29 DIAGNOSIS — R0602 Shortness of breath: Secondary | ICD-10-CM

## 2018-07-29 DIAGNOSIS — R05 Cough: Secondary | ICD-10-CM

## 2018-07-29 DIAGNOSIS — J101 Influenza due to other identified influenza virus with other respiratory manifestations: Secondary | ICD-10-CM | POA: Insufficient documentation

## 2018-07-29 DIAGNOSIS — R059 Cough, unspecified: Secondary | ICD-10-CM

## 2018-07-29 DIAGNOSIS — R51 Headache: Secondary | ICD-10-CM | POA: Diagnosis present

## 2018-07-29 LAB — URINALYSIS, ROUTINE W REFLEX MICROSCOPIC
BILIRUBIN URINE: NEGATIVE
Glucose, UA: NEGATIVE mg/dL
Hgb urine dipstick: NEGATIVE
Ketones, ur: 80 mg/dL — AB
LEUKOCYTES UA: NEGATIVE
NITRITE: NEGATIVE
PH: 7 (ref 5.0–8.0)
Protein, ur: NEGATIVE mg/dL
Specific Gravity, Urine: 1.018 (ref 1.005–1.030)

## 2018-07-29 LAB — INFLUENZA PANEL BY PCR (TYPE A & B)
INFLAPCR: NEGATIVE
INFLBPCR: POSITIVE — AB

## 2018-07-29 MED ORDER — ALBUTEROL SULFATE HFA 108 (90 BASE) MCG/ACT IN AERS: 2.0000 | INHALATION_SPRAY | Freq: Four times a day (QID) | RESPIRATORY_TRACT | 2 refills | Status: DC | PRN

## 2018-07-29 MED ORDER — PROMETHAZINE HCL 25 MG PO TABS: 12.5000 mg | ORAL_TABLET | Freq: Four times a day (QID) | ORAL | 0 refills | Status: DC | PRN

## 2018-07-29 MED ORDER — IPRATROPIUM-ALBUTEROL 0.5-2.5 (3) MG/3ML IN SOLN
3.0000 mL | Freq: Four times a day (QID) | RESPIRATORY_TRACT | Status: DC
  Administered 2018-07-29: 3 mL via RESPIRATORY_TRACT
  Filled 2018-07-29: qty 3

## 2018-07-29 MED ORDER — LACTATED RINGERS IV BOLUS
1000.0000 mL | Freq: Once | INTRAVENOUS | Status: AC
  Administered 2018-07-29: 1000 mL via INTRAVENOUS

## 2018-07-29 MED ORDER — OSELTAMIVIR PHOSPHATE 75 MG PO CAPS: 75.0000 mg | ORAL_CAPSULE | Freq: Two times a day (BID) | ORAL | 0 refills | Status: AC

## 2018-07-29 MED ORDER — BENZONATATE 100 MG PO CAPS: 100.0000 mg | ORAL_CAPSULE | Freq: Three times a day (TID) | ORAL | 0 refills | Status: DC | PRN

## 2018-07-29 MED ORDER — ONDANSETRON HCL 4 MG/2ML IJ SOLN
4.0000 mg | Freq: Once | INTRAMUSCULAR | Status: AC
  Administered 2018-07-29: 4 mg via INTRAVENOUS
  Filled 2018-07-29: qty 2

## 2018-07-29 MED ORDER — PROMETHAZINE HCL 25 MG/ML IJ SOLN
25.0000 mg | Freq: Once | INTRAMUSCULAR | Status: AC
  Administered 2018-07-29: 25 mg via INTRAVENOUS
  Filled 2018-07-29: qty 1

## 2018-07-29 MED ORDER — ACETAMINOPHEN 500 MG PO TABS
1000.0000 mg | ORAL_TABLET | Freq: Once | ORAL | Status: AC
  Administered 2018-07-29: 1000 mg via ORAL
  Filled 2018-07-29: qty 2

## 2018-07-29 NOTE — MAU Note (Signed)
Pt presents to MAU with complaints of fever, chills, body aches, headache for three days. States her roommate that lives with her has been diagnosed with the flu

## 2018-07-29 NOTE — Discharge Instructions (Signed)

## 2018-07-29 NOTE — MAU Provider Note (Signed)
Chief Complaint:  Chills; Fever; Headache; and Generalized Body Aches   None     HPI: Lauren Moss is a 27 y.o. [phone] at 8541w5d who presents to maternity admissions reporting chills, fever, body aches, and diarrhea x 24 hours. She reports her best friend had something like this a few days ago. She is short of breath with her symptoms and has a nonproductive cough.  She has not tried any treatments. Her symptoms are worsening.  She reports diarrhea x 3-4 in 24 hours. She reports good fetal movement, denies LOF, vaginal bleeding, vaginal itching/burning, urinary symptoms, h/a, dizziness, n/v, or fever/chills.    HPI  Past Medical History: Past Medical History:  Diagnosis Date  . Anemia   . Asthma   . Bipolar 1 disorder (HCC)    hx cutting   . Chlamydia   . Depression   . Gestational diabetes   . Heart murmur   . Hepatitis B antibody positive   . Mental disorder    depression and bipolar  . PONV (postoperative nausea and vomiting)   . Pregnancy induced hypertension   . Preterm labor   . S/P dilation and curettage    for TAB and ectopic    Past obstetric history: OB History  Gravida Para Term Preterm AB Living  7 5 4 1 1 5   SAB TAB Ectopic Multiple Live Births  0 0 1 0 5    # Outcome Date GA Lbr Len/2nd Weight Sex Delivery Anes PTL Lv  7 Current           6 Term 08/24/16 5465w5d 02:40 / 00:28 3456 g M Vag-Spont EPI  LIV  5 Term 05/19/14    F Vag-Spont   LIV  4 Term 03/11/13 3371w4d -21:10 / 00:14 3175 g M Vag-Spont None  LIV  3 Term 05/20/11 2227w2d 08:53 / 00:32 3110 g M Vag-Spont EPI  LIV  2 Preterm 08/07/09 7622w0d  3130 g M Vag-Spont EPI Y LIV     Complications: Other Excessive Bleeding  1 Ectopic 2009 497w0d            Birth Comments: right tube    Past Surgical History: Past Surgical History:  Procedure Laterality Date  . DILATION AND CURETTAGE OF UTERUS    . LIVER BIOPSY    . MOUTH SURGERY    . SKIN GRAFT    . SMALL INTESTINE SURGERY     pt had intusseseption  at 27 years old  . WISDOM TOOTH EXTRACTION      Family History: Family History  Adopted: Yes    Social History: Social History   Tobacco Use  . Smoking status: Former Smoker    Last attempt to quit: 07/08/2012    Years since quitting: 6.0  . Smokeless tobacco: Never Used  Substance Use Topics  . Alcohol use: No  . Drug use: No    Allergies:  Allergies  Allergen Reactions  . Other Anaphylaxis    Curry seasoning    Meds:  Medications Prior to Admission  Medication Sig Dispense Refill Last Dose  . multivitamin (VIT W/EXTRA C) CHEW chewable tablet Chew 1 tablet by mouth daily.   06/27/2018 at Unknown time    ROS:  Review of Systems  Constitutional: Positive for chills and fever. Negative for fatigue.  HENT: Positive for congestion.   Respiratory: Positive for cough and shortness of breath.   Cardiovascular: Negative for chest pain.  Gastrointestinal: Positive for diarrhea and nausea. Negative for abdominal pain  and vomiting.  Genitourinary: Negative for difficulty urinating, dysuria, flank pain, pelvic pain, vaginal bleeding, vaginal discharge and vaginal pain.  Musculoskeletal: Positive for myalgias.  Neurological: Negative for dizziness and headaches.  Psychiatric/Behavioral: Negative.      I have reviewed patient's Past Medical Hx, Surgical Hx, Family Hx, Social Hx, medications and allergies.   Physical Exam   Patient Vitals for the past 24 hrs:  BP Temp Pulse Resp SpO2  07/29/18 1406 133/65 99.5 F (37.5 C) 97 (!) 26 100 %  07/29/18 1347 (!) 145/88 99.6 F (37.6 C) (!) 115 16 100 %   Constitutional: Well-developed, well-nourished female in moderate distress.  HEART: mild tachycardia, normal  heart sounds, regular rhythm RESP: tachypnea with rate 26 on arrival, lung sounds clear and equal bilaterally, respirations improved following treatment to 16 GI: Abd soft, non-tender, gravid appropriate for gestational age.  MS: Extremities nontender, no edema,  normal ROM Neurologic: Alert and oriented x 4.  GU: Neg CVAT.   FHT:  Baseline 150, moderate variability, accelerations present, no decelerations Contractions: None on toco or to palpation   Labs: Results for orders placed or performed during the hospital encounter of 07/29/18 (from the past 24 hour(s))  Influenza panel by PCR (type A & B)     Status: Abnormal   Collection Time: 07/29/18  1:50 PM  Result Value Ref Range   Influenza A By PCR NEGATIVE NEGATIVE   Influenza B By PCR POSITIVE (A) NEGATIVE  Urinalysis, Routine w reflex microscopic     Status: Abnormal   Collection Time: 07/29/18  2:08 PM  Result Value Ref Range   Color, Urine YELLOW YELLOW   APPearance HAZY (A) CLEAR   Specific Gravity, Urine 1.018 1.005 - 1.030   pH 7.0 5.0 - 8.0   Glucose, UA NEGATIVE NEGATIVE mg/dL   Hgb urine dipstick NEGATIVE NEGATIVE   Bilirubin Urine NEGATIVE NEGATIVE   Ketones, ur 80 (A) NEGATIVE mg/dL   Protein, ur NEGATIVE NEGATIVE mg/dL   Nitrite NEGATIVE NEGATIVE   Leukocytes, UA NEGATIVE NEGATIVE      Imaging:  Dg Chest 2 View  Result Date: 07/29/2018 CLINICAL DATA:  Shortness of breath, [redacted] weeks pregnant, tachypnea EXAM: CHEST - 2 VIEW COMPARISON:  07/01/2017 FINDINGS: Lungs are clear.  No pleural effusion or pneumothorax. The heart is normal in size. Visualized osseous structures are within normal limits. IMPRESSION: Normal chest radiographs. Electronically Signed   By: Charline BillsSriyesh  Krishnan M.D.   On: 07/29/2018 15:13    MAU Course/MDM: I have ordered labs and reviewed results.  NST reviewed and reactive Pt with tachypnea and SOB on arrival, ua with 80 ketones so IV fluids x 2 L given, influenza swab collected, and CXR and breathing treatment ordered Pt also given Phenergan and Zofran IV for nausea, then was able to tolerate Tylenol 1000 mg PO x 1 Influenza B positive today Pt SOB and tachypnea resolved, lung sounds clear and equal, no evidence of pneumonia today D/C home with Rx  for Tamiflu, Tessalon Perles, albuterol inhaler, and Phenergan PO F/U in office as scheduled Return to MAU for emergencies Pt discharge with strict return precautions.   Assessment: 1. Influenza B   2. Shortness of breath   3. Tachypnea   4. [redacted] weeks gestation of pregnancy   5. Nausea and vomiting during pregnancy   6. Cough     Plan: Discharge home Labor precautions and fetal kick counts Follow-up Information    [facility]  Specialty:  Obstetrics and Gynecology Why:  As scheduled. Return to MAU for worsening symptoms or emergencies. Contact information: [address] [place] [address] [phone]         Allergies as of 07/29/2018      Reactions   Other Anaphylaxis   Clementeen Graham seasoning      Medication List    TAKE these medications   albuterol 108 (90 Base) MCG/ACT inhaler Commonly known as:  PROVENTIL HFA;VENTOLIN HFA Inhale 2 puffs into the lungs every 6 (six) hours as needed for wheezing or shortness of breath.   benzonatate 100 MG capsule Commonly known as:  TESSALON PERLES Take 1 capsule (100 mg total) by mouth 3 (three) times daily as needed for cough.   multivitamin Chew chewable tablet Chew 1 tablet by mouth daily.   oseltamivir 75 MG capsule Commonly known as:  TAMIFLU Take 1 capsule (75 mg total) by mouth 2 (two) times daily for 5 days.   promethazine 25 MG tablet Commonly known as:  PHENERGAN Take 0.5-1 tablets (12.5-25 mg total) by mouth every 6 (six) hours as needed for nausea.       Sharen Counter Certified Nurse-Midwife 07/29/2018 4:23 PM

## 2018-08-17 ENCOUNTER — Encounter (HOSPITAL_COMMUNITY): Payer: Self-pay | Admitting: *Deleted

## 2018-08-17 ENCOUNTER — Inpatient Hospital Stay (HOSPITAL_COMMUNITY)
Admission: AD | Admit: 2018-08-17 | Discharge: 2018-08-19 | DRG: 833 | Discharge status: Against Medical Advice | Payer: Medicaid Other | Attending: Obstetrics & Gynecology | Admitting: Obstetrics & Gynecology

## 2018-08-17 ENCOUNTER — Other Ambulatory Visit: Payer: Self-pay

## 2018-08-17 DIAGNOSIS — O98413 Viral hepatitis complicating pregnancy, third trimester: Secondary | ICD-10-CM | POA: Diagnosis not present

## 2018-08-17 DIAGNOSIS — O0930 Supervision of pregnancy with insufficient antenatal care, unspecified trimester: Secondary | ICD-10-CM

## 2018-08-17 DIAGNOSIS — O26893 Other specified pregnancy related conditions, third trimester: Secondary | ICD-10-CM | POA: Diagnosis present

## 2018-08-17 DIAGNOSIS — O99512 Diseases of the respiratory system complicating pregnancy, second trimester: Secondary | ICD-10-CM | POA: Diagnosis present

## 2018-08-17 DIAGNOSIS — Z87891 Personal history of nicotine dependence: Secondary | ICD-10-CM | POA: Diagnosis not present

## 2018-08-17 DIAGNOSIS — Z363 Encounter for antenatal screening for malformations: Secondary | ICD-10-CM | POA: Diagnosis not present

## 2018-08-17 DIAGNOSIS — R8271 Bacteriuria: Secondary | ICD-10-CM | POA: Diagnosis present

## 2018-08-17 DIAGNOSIS — O2303 Infections of kidney in pregnancy, third trimester: Secondary | ICD-10-CM | POA: Diagnosis not present

## 2018-08-17 DIAGNOSIS — Z5329 Procedure and treatment not carried out because of patient's decision for other reasons: Secondary | ICD-10-CM | POA: Diagnosis present

## 2018-08-17 DIAGNOSIS — Z3A27 27 weeks gestation of pregnancy: Secondary | ICD-10-CM | POA: Diagnosis not present

## 2018-08-17 DIAGNOSIS — O2302 Infections of kidney in pregnancy, second trimester: Principal | ICD-10-CM | POA: Diagnosis present

## 2018-08-17 DIAGNOSIS — J45909 Unspecified asthma, uncomplicated: Secondary | ICD-10-CM | POA: Diagnosis present

## 2018-08-17 DIAGNOSIS — O2300 Infections of kidney in pregnancy, unspecified trimester: Secondary | ICD-10-CM | POA: Diagnosis present

## 2018-08-17 DIAGNOSIS — O0990 Supervision of high risk pregnancy, unspecified, unspecified trimester: Secondary | ICD-10-CM

## 2018-08-17 DIAGNOSIS — O09293 Supervision of pregnancy with other poor reproductive or obstetric history, third trimester: Secondary | ICD-10-CM | POA: Diagnosis not present

## 2018-08-17 DIAGNOSIS — O0933 Supervision of pregnancy with insufficient antenatal care, third trimester: Secondary | ICD-10-CM | POA: Diagnosis not present

## 2018-08-17 DIAGNOSIS — N9089 Other specified noninflammatory disorders of vulva and perineum: Secondary | ICD-10-CM | POA: Diagnosis present

## 2018-08-17 HISTORY — DX: Personal history of unspecified abuse in childhood: Z62.819

## 2018-08-17 HISTORY — DX: Unspecified ectopic pregnancy without intrauterine pregnancy: O00.90

## 2018-08-17 LAB — CBC
HCT: 30.3 % — ABNORMAL LOW (ref 36.0–46.0)
Hemoglobin: 10.1 g/dL — ABNORMAL LOW (ref 12.0–15.0)
MCH: 30.8 pg (ref 26.0–34.0)
MCHC: 33.3 g/dL (ref 30.0–36.0)
MCV: 92.4 fL (ref 80.0–100.0)
Platelets: 177 10*3/uL (ref 150–400)
RBC: 3.28 MIL/uL — ABNORMAL LOW (ref 3.87–5.11)
RDW: 13.8 % (ref 11.5–15.5)
WBC: 6.4 10*3/uL (ref 4.0–10.5)
nRBC: 0 % (ref 0.0–0.2)

## 2018-08-17 LAB — URINALYSIS, ROUTINE W REFLEX MICROSCOPIC
Bilirubin Urine: NEGATIVE
Glucose, UA: NEGATIVE mg/dL
Ketones, ur: 5 mg/dL — AB
Nitrite: NEGATIVE
PH: 6 (ref 5.0–8.0)
Protein, ur: 100 mg/dL — AB
RBC / HPF: >50 RBC/hpf — ABNORMAL HIGH (ref 0–5)
SPECIFIC GRAVITY, URINE: 1.015 (ref 1.005–1.030)
WBC, UA: >50 WBC/hpf — ABNORMAL HIGH (ref 0–5)

## 2018-08-17 LAB — TYPE AND SCREEN
ABO/RH(D): A POS
ANTIBODY SCREEN: NEGATIVE

## 2018-08-17 LAB — RAPID URINE DRUG SCREEN, HOSP PERFORMED
Amphetamines: NOT DETECTED
Barbiturates: NOT DETECTED
Benzodiazepines: NOT DETECTED
Cocaine: NOT DETECTED
Opiates: NOT DETECTED
Tetrahydrocannabinol: POSITIVE — AB

## 2018-08-17 LAB — DIFFERENTIAL
Basophils Absolute: 0 10*3/uL (ref 0.0–0.1)
Basophils Relative: 0 %
Eosinophils Absolute: 0 10*3/uL (ref 0.0–0.5)
Eosinophils Relative: 0 %
Lymphocytes Relative: 13 %
Lymphs Abs: 0.9 10*3/uL (ref 0.7–4.0)
MONO ABS: 0.7 10*3/uL (ref 0.1–1.0)
Monocytes Relative: 11 %
Neutro Abs: 4.8 10*3/uL (ref 1.7–7.7)
Neutrophils Relative %: 76 %

## 2018-08-17 LAB — WET PREP, GENITAL
Clue Cells Wet Prep HPF POC: NONE SEEN
Sperm: NONE SEEN
Trich, Wet Prep: NONE SEEN

## 2018-08-17 MED ORDER — PROMETHAZINE HCL 25 MG/ML IJ SOLN
25.0000 mg | Freq: Four times a day (QID) | INTRAMUSCULAR | Status: DC | PRN
  Administered 2018-08-17 – 2018-08-18 (×3): 25 mg via INTRAVENOUS
  Filled 2018-08-17 (×3): qty 1

## 2018-08-17 MED ORDER — PRENATAL MULTIVITAMIN CH
1.0000 | ORAL_TABLET | Freq: Every day | ORAL | Status: DC
  Administered 2018-08-18: 1 via ORAL
  Filled 2018-08-17 (×3): qty 1

## 2018-08-17 MED ORDER — IBUPROFEN 600 MG PO TABS
600.0000 mg | ORAL_TABLET | Freq: Four times a day (QID) | ORAL | Status: DC | PRN
  Administered 2018-08-17: 600 mg via ORAL
  Filled 2018-08-17: qty 1

## 2018-08-17 MED ORDER — DOCUSATE SODIUM 100 MG PO CAPS
100.0000 mg | ORAL_CAPSULE | Freq: Every day | ORAL | Status: DC
  Administered 2018-08-18: 100 mg via ORAL
  Filled 2018-08-17 (×4): qty 1

## 2018-08-17 MED ORDER — LACTATED RINGERS IV BOLUS
1000.0000 mL | Freq: Once | INTRAVENOUS | Status: AC
  Administered 2018-08-17: 1000 mL via INTRAVENOUS

## 2018-08-17 MED ORDER — CALCIUM CARBONATE ANTACID 500 MG PO CHEW: 2.0000 | CHEWABLE_TABLET | ORAL | Status: DC | PRN

## 2018-08-17 MED ORDER — ONDANSETRON HCL 4 MG/2ML IJ SOLN
4.0000 mg | Freq: Four times a day (QID) | INTRAMUSCULAR | Status: DC | PRN
  Administered 2018-08-17: 4 mg via INTRAVENOUS
  Filled 2018-08-17 (×2): qty 2

## 2018-08-17 MED ORDER — SODIUM CHLORIDE 0.9 % IV SOLN
2.0000 g | INTRAVENOUS | Status: DC
  Administered 2018-08-17 – 2018-08-18 (×2): 2 g via INTRAVENOUS
  Filled 2018-08-17 (×2): qty 20

## 2018-08-17 MED ORDER — ZOLPIDEM TARTRATE 5 MG PO TABS: 5.0000 mg | ORAL_TABLET | Freq: Every evening | ORAL | Status: DC | PRN

## 2018-08-17 MED ORDER — ACETAMINOPHEN 325 MG PO TABS
650.0000 mg | ORAL_TABLET | ORAL | Status: DC | PRN
  Administered 2018-08-17 – 2018-08-18 (×3): 650 mg via ORAL
  Filled 2018-08-17 (×3): qty 2

## 2018-08-17 NOTE — H&P (Signed)
OBSTETRIC ADMISSION HISTORY AND PHYSICAL  Lauren Moss is a 28 y.o. female [phone] with IUP at [redacted]w[redacted]d presenting for Pyelonephritis. She reports +FMs. No LOF, VB, or ctx. Reports 1 day hx of N/V, fever, body aches, and bilateral mid back pain. No urinary sx. Endorses thick, white, curdy vaginal discharge with itching.   Dating: By 11 wk [place] --->  Estimated Date of Delivery: 11/13/18  Prenatal History/Complications: -No PNC -Hx PTD @36  wks -Hx of Hep B  Past Medical History: Past Medical History:  Diagnosis Date  . Anemia   . Asthma   . Bipolar 1 disorder (HCC)    hx cutting   . Chlamydia   . Depression   . Gestational diabetes   . Heart murmur   . Hepatitis B antibody positive   . Mental disorder    depression and bipolar  . PONV (postoperative nausea and vomiting)   . Pregnancy induced hypertension   . Preterm labor   . S/P dilation and curettage    for TAB and ectopic    Past Surgical History: Past Surgical History:  Procedure Laterality Date  . DILATION AND CURETTAGE OF UTERUS    . LIVER BIOPSY    . MOUTH SURGERY    . SKIN GRAFT    . SMALL INTESTINE SURGERY     pt had intusseseption at 28 years old  . WISDOM TOOTH EXTRACTION      Obstetrical History: OB History    Gravida  7   Para  5   Term  4   Preterm  1   AB  1   Living  5     SAB  0   TAB  0   Ectopic  1   Multiple  0   Live Births  5           Social History: Social History   Socioeconomic History  . Marital status: Legally Separated    Spouse name: Not on file  . Number of children: Not on file  . Years of education: Not on file  . Highest education level: Not on file  Occupational History  . Not on file  Social Needs  . Financial resource strain: Not on file  . Food insecurity:    Worry: Not on file    Inability: Not on file  . Transportation needs:    Medical: Not on file    Non-medical: Not on file  Tobacco Use  . Smoking status: Former Smoker    Last  attempt to quit: 07/08/2012    Years since quitting: 6.1  . Smokeless tobacco: Never Used  Substance and Sexual Activity  . Alcohol use: No  . Drug use: No  . Sexual activity: Yes    Birth control/protection: None    Comment: last sex august 2019  Lifestyle  . Physical activity:    Days per week: Not on file    Minutes per session: Not on file  . Stress: Not on file  Relationships  . Social connections:    Talks on phone: Not on file    Gets together: Not on file    Attends religious service: Not on file    Active member of club or organization: Not on file    Attends meetings of clubs or organizations: Not on file    Relationship status: Not on file  Other Topics Concern  . Not on file  Social History Narrative  . Not on file    Family  History: Family History  Adopted: Yes    Allergies: Allergies  Allergen Reactions  . Other Anaphylaxis    Curry seasoning    Medications Prior to Admission  Medication Sig Dispense Refill Last Dose  . albuterol (PROVENTIL HFA;VENTOLIN HFA) 108 (90 Base) MCG/ACT inhaler Inhale 2 puffs into the lungs every 6 (six) hours as needed for wheezing or shortness of breath. 1 Inhaler 2   . benzonatate (TESSALON PERLES) 100 MG capsule Take 1 capsule (100 mg total) by mouth 3 (three) times daily as needed for cough. 20 capsule 0   . multivitamin (VIT W/EXTRA C) CHEW chewable tablet Chew 1 tablet by mouth daily.   06/27/2018 at Unknown time  . promethazine (PHENERGAN) 25 MG tablet Take 0.5-1 tablets (12.5-25 mg total) by mouth every 6 (six) hours as needed for nausea. 30 tablet 0      Review of Systems   All systems reviewed and negative except as stated in HPI  Blood pressure 128/67, pulse (!) 116, temperature 99.6 F (37.6 C), temperature source Oral, resp. rate (!) 22, weight 94.2 kg, last menstrual period 01/05/2018, SpO2 99 %, unknown if currently breastfeeding. General appearance: alert, cooperative and no distress Lungs: regular rate  and effort Heart: regular rate  Abdomen: soft, non-tender, +CVAT bilat Extremities: Homans sign is negative, no sign of DVT Fetal monitoringBaseline: 155 bpm, Variability: Good {> 6 bpm), Accelerations: Reactive and Decelerations: Absent Uterine activityNone    Results for orders placed or performed during the hospital encounter of 08/17/18 (from the past 24 hour(s))  Urinalysis, Routine w reflex microscopic   Collection Time: 08/17/18  2:26 PM  Result Value Ref Range   Color, Urine YELLOW YELLOW   APPearance TURBID (A) CLEAR   Specific Gravity, Urine 1.015 1.005 - 1.030   pH 6.0 5.0 - 8.0   Glucose, UA NEGATIVE NEGATIVE mg/dL   Hgb urine dipstick SMALL (A) NEGATIVE   Bilirubin Urine NEGATIVE NEGATIVE   Ketones, ur 5 (A) NEGATIVE mg/dL   Protein, ur 161100 (A) NEGATIVE mg/dL   Nitrite NEGATIVE NEGATIVE   Leukocytes, UA LARGE (A) NEGATIVE   RBC / HPF >50 (H) 0 - 5 RBC/hpf   WBC, UA >50 (H) 0 - 5 WBC/hpf   Bacteria, UA MANY (A) NONE SEEN   Squamous Epithelial / LPF 21-50 0 - 5   Mucus PRESENT    Non Squamous Epithelial 11-20 (A) NONE SEEN    Patient Active Problem List   Diagnosis Date Noted  . Pyelonephritis affecting pregnancy 08/17/2018  . SVD (spontaneous vaginal delivery) 08/26/2016  . Normal labor 08/23/2016  . Marijuana use 03/28/2016  . Supervision of high risk pregnancy in second trimester 10/12/2012  . History of gestational diabetes in prior pregnancy, currently pregnant in second trimester 04/09/2011  . Depression 04/01/2011  . Bipolar disorder (HCC) 04/01/2011  . Hep B complicating pregnancy 04/01/2011    Assessment: Lauren Moss is a 28 y.o. [id number] at 7980w3d here for Pyelonephritis   Plan: Admit to [facility] UC pending  Donette LarryMelanie Ziv Welchel, CNM  08/17/2018, 3:42 PM

## 2018-08-17 NOTE — Progress Notes (Signed)
FHR tracing sketchy @ times secondary maternal position.

## 2018-08-17 NOTE — MAU Note (Signed)
Had a yeast infection, took some monistat yesterday, then this "started out of nowhere". (pt unsteady, vomiting in triage).  Fever, chills, body aches  (mainly lower back and neck).  Denies cough or sore throat.  Vomiting started today.  Vaginal itching and pain- little relief after monistat

## 2018-08-18 ENCOUNTER — Inpatient Hospital Stay (HOSPITAL_BASED_OUTPATIENT_CLINIC_OR_DEPARTMENT_OTHER): Payer: Medicaid Other

## 2018-08-18 ENCOUNTER — Encounter (HOSPITAL_COMMUNITY): Payer: Self-pay | Admitting: Obstetrics and Gynecology

## 2018-08-18 DIAGNOSIS — O98413 Viral hepatitis complicating pregnancy, third trimester: Secondary | ICD-10-CM

## 2018-08-18 DIAGNOSIS — O09293 Supervision of pregnancy with other poor reproductive or obstetric history, third trimester: Secondary | ICD-10-CM | POA: Diagnosis not present

## 2018-08-18 DIAGNOSIS — O0933 Supervision of pregnancy with insufficient antenatal care, third trimester: Secondary | ICD-10-CM | POA: Diagnosis not present

## 2018-08-18 DIAGNOSIS — Z363 Encounter for antenatal screening for malformations: Secondary | ICD-10-CM | POA: Diagnosis not present

## 2018-08-18 DIAGNOSIS — O99213 Obesity complicating pregnancy, third trimester: Secondary | ICD-10-CM

## 2018-08-18 DIAGNOSIS — Z3A29 29 weeks gestation of pregnancy: Secondary | ICD-10-CM

## 2018-08-18 LAB — RUBELLA SCREEN: Rubella: 1.19 index (ref >0.99)

## 2018-08-18 LAB — CULTURE, OB URINE: Culture: >=100000 — AB

## 2018-08-18 LAB — GC/CHLAMYDIA PROBE AMP (~~LOC~~) NOT AT ARMC
Chlamydia: NEGATIVE
Neisseria Gonorrhea: NEGATIVE

## 2018-08-18 LAB — RPR: RPR Ser Ql: NONREACTIVE

## 2018-08-18 LAB — HIV ANTIBODY (ROUTINE TESTING W REFLEX): HIV Screen 4th Generation wRfx: NONREACTIVE

## 2018-08-18 MED ORDER — FLUCONAZOLE 150 MG PO TABS
150.0000 mg | ORAL_TABLET | Freq: Once | ORAL | Status: AC
  Administered 2018-08-18: 150 mg via ORAL
  Filled 2018-08-18: qty 1

## 2018-08-18 MED ORDER — SODIUM CHLORIDE 0.9 % IV SOLN
INTRAVENOUS | Status: DC
  Administered 2018-08-18: 21:00:00 via INTRAVENOUS

## 2018-08-18 NOTE — Plan of Care (Signed)
  Problem: Education: Goal: Knowledge of General Education information will improve Description Including pain rating scale, medication(s)/side effects and non-pharmacologic comfort measures Outcome: Completed/Met

## 2018-08-18 NOTE — Progress Notes (Signed)
Next dose of IV antibiotic due.  Pt c/o pain and distal numbness at IV site.  No swelling or redness noted and flushed well at last medication administration.  Pain appears to be patient moves her had or when site is touched.    Pt unwilling to allow RN to flush IV at this time due to increased pain.  MD notified of IV status, orders given to remove IV and restart at another location.  IV removed, site appears WNL.

## 2018-08-18 NOTE — Progress Notes (Signed)
Patient ID: Lauren Moss, female   DOB: 1990/11/18, 28 y.o.   MRN: [id number] FACULTY PRACTICE ANTEPARTUM(COMPREHENSIVE) NOTE  Lauren Moss is a 28 y.o. G2X5284 at [redacted]w[redacted]d by best clinical estimate who is admitted for pyelonephritis.   Fetal presentation is unsure. Length of Stay:  1  Days  Subjective: Less pain Patient reports the fetal movement as active. Patient reports uterine contraction  activity as none. Patient reports  vaginal bleeding as none. Patient describes fluid per vagina as None.  Vitals:  Blood pressure 105/61, pulse 91, temperature 98 F (36.7 C), temperature source Oral, resp. rate 18, height 5\' 3"  (1.6 m), weight 93.9 kg, last menstrual period 01/05/2018, SpO2 100 %, unknown if currently breastfeeding.. 24 Hour Max Max : 102.4102.4Max : 102. : : 98 102.4 (39.1) Temp F (C) 01/14 2113 Physical Examination:  General appearance - alert, well appearing, and in no distress Heart - normal rate and regular rhythm Abdomen - soft, nontender, nondistended minimal CVAT Fundal Height:  size equals dates Cervical Exam: Not evaluated. . Extremities: extremities normal, atraumatic, no cyanosis or edema and Homans sign is negative, no sign of DVT  Membranes:intact  Fetal Monitoring:   Fetal Heart Rate A  Mode External  [removed] filed at 08/17/2018 2330  Baseline Rate (A) 150 bpm filed at 08/17/2018 2330  Variability 6-25 BPM filed at 08/17/2018 2330  Accelerations 15 x 15 filed at 08/17/2018 2330  Decelerations None filed at 08/17/2018 2330     Labs:  Results for orders placed or performed during the hospital encounter of 08/17/18 (from the past 24 hour(s))  Urinalysis, Routine w reflex microscopic   Collection Time: 08/17/18  2:26 PM  Result Value Ref Range   Color, Urine YELLOW YELLOW   APPearance TURBID (A) CLEAR   Specific Gravity, Urine 1.015 1.005 - 1.030   pH 6.0 5.0 - 8.0   Glucose, UA NEGATIVE NEGATIVE mg/dL   Hgb urine dipstick  SMALL (A) NEGATIVE   Bilirubin Urine NEGATIVE NEGATIVE   Ketones, ur 5 (A) NEGATIVE mg/dL   Protein, ur 132 (A) NEGATIVE mg/dL   Nitrite NEGATIVE NEGATIVE   Leukocytes, UA LARGE (A) NEGATIVE   RBC / HPF >50 (H) 0 - 5 RBC/hpf   WBC, UA >50 (H) 0 - 5 WBC/hpf   Bacteria, UA MANY (A) NONE SEEN   Squamous Epithelial / LPF 21-50 0 - 5   Mucus PRESENT    Non Squamous Epithelial 11-20 (A) NONE SEEN  Urine rapid drug screen (hosp performed)   Collection Time: 08/17/18  2:26 PM  Result Value Ref Range   Opiates NONE DETECTED NONE DETECTED   Cocaine NONE DETECTED NONE DETECTED   Benzodiazepines NONE DETECTED NONE DETECTED   Amphetamines NONE DETECTED NONE DETECTED   Tetrahydrocannabinol POSITIVE (A) NONE DETECTED   Barbiturates NONE DETECTED NONE DETECTED  Hepatitis B surface antigen   Collection Time: 08/17/18  4:09 PM  Result Value Ref Range   Hepatitis B Surface Ag Positive (A) Negative  HIV Antibody (routine testing w rflx)   Collection Time: 08/17/18  4:09 PM  Result Value Ref Range   HIV Screen 4th Generation wRfx Non Reactive Non Reactive  RPR   Collection Time: 08/17/18  4:09 PM  Result Value Ref Range   RPR Ser Ql Non Reactive Non Reactive  Rubella screen   Collection Time: 08/17/18  4:09 PM  Result Value Ref Range   Rubella 1.19 Immune >0.99 index  CBC   Collection Time: 08/17/18  4:09 PM  Result Value Ref Range   WBC 6.4 4.0 - 10.5 K/uL   RBC 3.28 (L) 3.87 - 5.11 MIL/uL   Hemoglobin 10.1 (L) 12.0 - 15.0 g/dL   HCT 16.130.3 (L) 09.636.0 - 04.546.0 %   MCV 92.4 80.0 - 100.0 fL   MCH 30.8 26.0 - 34.0 pg   MCHC 33.3 30.0 - 36.0 g/dL   RDW 40.913.8 81.111.5 - 91.415.5 %   Platelets 177 150 - 400 K/uL   nRBC 0.0 0.0 - 0.2 %  Differential   Collection Time: 08/17/18  4:09 PM  Result Value Ref Range   Neutrophils Relative % 76 %   Neutro Abs 4.8 1.7 - 7.7 K/uL   Lymphocytes Relative 13 %   Lymphs Abs 0.9 0.7 - 4.0 K/uL   Monocytes Relative 11 %   Monocytes Absolute 0.7 0.1 - 1.0 K/uL    Eosinophils Relative 0 %   Eosinophils Absolute 0.0 0.0 - 0.5 K/uL   Basophils Relative 0 %   Basophils Absolute 0.0 0.0 - 0.1 K/uL  Type and screen   Collection Time: 08/17/18  4:09 PM  Result Value Ref Range   ABO/RH(D) A POS    Antibody Screen NEG    Sample Expiration      08/20/2018 Performed at [facility], [address] [place], [place] [address]   Wet prep, genital   Collection Time: 08/17/18  4:30 PM  Result Value Ref Range   Yeast Wet Prep HPF POC PRESENT (A) NONE SEEN   Trich, Wet Prep NONE SEEN NONE SEEN   Clue Cells Wet Prep HPF POC NONE SEEN NONE SEEN   WBC, Wet Prep HPF POC MANY (A) NONE SEEN   Sperm NONE SEEN      Medications:  Scheduled . docusate sodium  100 mg Oral Daily  . fluconazole  150 mg Oral Once  . prenatal multivitamin  1 tablet Oral Q1200   I have reviewed the patient's current medications.  ASSESSMENT: Patient Active Problem List   Diagnosis Date Noted  . Pyelonephritis affecting pregnancy 08/17/2018  . Pyelonephritis affecting pregnancy, antepartum 08/17/2018  . SVD (spontaneous vaginal delivery) 08/26/2016  . Normal labor 08/23/2016  . Marijuana use 03/28/2016  . Supervision of high risk pregnancy in second trimester 10/12/2012  . History of gestational diabetes in prior pregnancy, currently pregnant in second trimester 04/09/2011  . Depression 04/01/2011  . Bipolar disorder (HCC) 04/01/2011  . Hep B complicating pregnancy 04/01/2011    PLAN: Continue IV abx until >24 hr afebrile  Scheryl DarterJames Arnold 08/18/2018,10:06 AM

## 2018-08-19 ENCOUNTER — Other Ambulatory Visit: Payer: Self-pay | Admitting: Obstetrics & Gynecology

## 2018-08-19 DIAGNOSIS — O2300 Infections of kidney in pregnancy, unspecified trimester: Secondary | ICD-10-CM

## 2018-08-19 MED ORDER — SULFAMETHOXAZOLE-TRIMETHOPRIM 800-160 MG PO TABS: 1.0000 | ORAL_TABLET | Freq: Two times a day (BID) | ORAL | 0 refills | Status: DC

## 2018-08-19 NOTE — Progress Notes (Signed)
CSW met with MOB via bedside due to history of cutting/ bipolar disorder. MOB did not engage in conversation with CSW and questioned why she needed to speak with a Education officer, museum. CSW informed MOB that this writer wanted to see how she was feeling emotionally at this time/ throughout her pregnancy- MOB stated "I feel great". CSW questioned if this was her first pregnancy- MOB stated "nope". CSW questioned how many other children she had- MOB stated "multiple". CSW informed MOB that she was available to speak with her if she felt it was needed before leaving the hospital/ wanted to talk about any concerns.   No concerns voiced by MOB at this time.   Kingsley Spittle, Alpha  [phone]

## 2018-08-19 NOTE — Progress Notes (Signed)
OB Note Patient with painful lesions on right labia. No prior h/o, pt denies h/o hsv, pt noticed them earlier today  Two small (subcm) rounded white-flesh colored bumps on the right labia about half way down. Swabs obtained and pt with pain out of proportion to what the lesions look like  hsv swabs sent. No prior h/o hsv so will wait before starting any potential anti-virals  Cornelia Copa MD Attending Center for [employer] (Faculty Practice) 08/18/2018 Time: 2310

## 2018-08-19 NOTE — Progress Notes (Signed)
Patient called out and requested to see Doctor.  I told her that Dr Debroah Loop was on the floor earlier and she was sleeping. I told her he had to go to surgery and he would be back up soon.  She state "I want to leave you guys are not doing anything for me and I'm offended that you sent a social worker in here."  I explained that with her mental health history it is our policy to have a SW offer their services.  She still requested to leave, I had her sign the AMA form and she left.

## 2018-08-20 LAB — HEPATITIS B SURFACE ANTIGEN: Hepatitis B Surface Ag: POSITIVE — AB

## 2018-08-22 LAB — HSV CULTURE AND TYPING

## 2018-08-23 ENCOUNTER — Telehealth: Payer: Self-pay | Admitting: *Deleted

## 2018-08-23 ENCOUNTER — Encounter: Payer: Self-pay | Admitting: Obstetrics and Gynecology

## 2018-08-23 ENCOUNTER — Telehealth: Payer: Self-pay | Admitting: Obstetrics and Gynecology

## 2018-08-23 DIAGNOSIS — O98519 Other viral diseases complicating pregnancy, unspecified trimester: Secondary | ICD-10-CM

## 2018-08-23 DIAGNOSIS — B009 Herpesviral infection, unspecified: Secondary | ICD-10-CM | POA: Insufficient documentation

## 2018-08-23 MED ORDER — VALACYCLOVIR HCL 500 MG PO TABS: ORAL_TABLET | ORAL | 0 refills | Status: DC

## 2018-08-23 MED ORDER — VALACYCLOVIR HCL 1 G PO TABS: 1000.0000 mg | ORAL_TABLET | Freq: Two times a day (BID) | ORAL | 0 refills | Status: AC

## 2018-08-23 NOTE — Telephone Encounter (Signed)
OB Telephone Note Patient called and informed of +HSV2 swab and Rx sent in. Importance of taking Rx d/w pt and she doesn't have OB appt with us. inbasket message sent to pt and importance of outpatient follow up d/w her and delivery planning as to how can have baby safely but will need to be determined at outpatient visit. Pt verbalizes understanding.   Lauren Moss, Jr MD Attending Center for [facility] (Faculty Practice) 08/23/2018 Time: [phone]

## 2018-08-23 NOTE — Telephone Encounter (Signed)
-----   Message from Adam PhenixJames G Arnold, MD sent at 08/22/2018 10:17 AM EST ----- She was admitted with no PNC and pyelonephritis and left AMA. She needs to have f/u NOB and notification of positive HSV result

## 2018-08-23 NOTE — Telephone Encounter (Signed)
I called Lauren Moss and notified her of dx of herpes and that a prescription for valtrex sent to her pharmacy . I reviewed instructions for both prescriptions - initial and maintenance. She voiced understanding. I also informed her we need to get her back in for a new ob appt asap and asked if she had any days she could not come- she states she can come anyday/ time. I informed her I will have registrars call her with appt asap .

## 2018-08-25 ENCOUNTER — Ambulatory Visit: Payer: Medicaid Other | Admitting: Clinical

## 2018-08-25 ENCOUNTER — Encounter: Payer: Self-pay | Admitting: Obstetrics & Gynecology

## 2018-08-25 ENCOUNTER — Ambulatory Visit (INDEPENDENT_AMBULATORY_CARE_PROVIDER_SITE_OTHER): Payer: Medicaid Other | Admitting: Obstetrics & Gynecology

## 2018-08-25 VITALS — BP 118/69 | HR 91 | Wt 205.1 lb

## 2018-08-25 DIAGNOSIS — O2303 Infections of kidney in pregnancy, third trimester: Secondary | ICD-10-CM | POA: Diagnosis not present

## 2018-08-25 DIAGNOSIS — O0993 Supervision of high risk pregnancy, unspecified, third trimester: Secondary | ICD-10-CM | POA: Diagnosis present

## 2018-08-25 DIAGNOSIS — B1910 Unspecified viral hepatitis B without hepatic coma: Secondary | ICD-10-CM

## 2018-08-25 DIAGNOSIS — O98413 Viral hepatitis complicating pregnancy, third trimester: Secondary | ICD-10-CM

## 2018-08-25 DIAGNOSIS — O98419 Viral hepatitis complicating pregnancy, unspecified trimester: Secondary | ICD-10-CM

## 2018-08-25 DIAGNOSIS — O98513 Other viral diseases complicating pregnancy, third trimester: Secondary | ICD-10-CM

## 2018-08-25 DIAGNOSIS — B009 Herpesviral infection, unspecified: Secondary | ICD-10-CM | POA: Diagnosis not present

## 2018-08-25 DIAGNOSIS — Z0489 Encounter for examination and observation for other specified reasons: Secondary | ICD-10-CM

## 2018-08-25 DIAGNOSIS — O2300 Infections of kidney in pregnancy, unspecified trimester: Secondary | ICD-10-CM

## 2018-08-25 DIAGNOSIS — O0930 Supervision of pregnancy with insufficient antenatal care, unspecified trimester: Secondary | ICD-10-CM

## 2018-08-25 DIAGNOSIS — Z23 Encounter for immunization: Secondary | ICD-10-CM

## 2018-08-25 DIAGNOSIS — O0933 Supervision of pregnancy with insufficient antenatal care, third trimester: Secondary | ICD-10-CM

## 2018-08-25 DIAGNOSIS — O0990 Supervision of high risk pregnancy, unspecified, unspecified trimester: Secondary | ICD-10-CM

## 2018-08-25 DIAGNOSIS — IMO0002 Reserved for concepts with insufficient information to code with codable children: Secondary | ICD-10-CM

## 2018-08-25 MED ORDER — TRAMADOL HCL 50 MG PO TABS: 100.0000 mg | ORAL_TABLET | Freq: Four times a day (QID) | ORAL | 0 refills | Status: DC | PRN

## 2018-08-25 MED ORDER — VALACYCLOVIR HCL 500 MG PO TABS: ORAL_TABLET | ORAL | 0 refills | Status: DC

## 2018-08-25 MED ORDER — LIDOCAINE HCL URETHRAL/MUCOSAL 2 % EX GEL: 1.0000 "application " | CUTANEOUS | 2 refills | Status: DC | PRN
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
## 2018-08-25 NOTE — BH Specialist Note (Signed)
Integrated Behavioral Health Initial Visit  MRN: [id number] Name: Lauren Moss  Number of Integrated Behavioral Health Clinician visits:: 1/6 Session Start time: 4:13 Session End time: 4:43 Total time: 15 minutes  Type of Service: Integrated Behavioral Health- Individual/Family Interpretor:No. Interpretor Name and Language: n/a   Warm Hand Off Completed.       SUBJECTIVE: Lauren Moss is a 28 y.o. female accompanied by n/a Patient was referred by [name], MD for Initial OB introduction to integrated behavioral health services . Patient reports the following symptoms/concerns: Pt states she has no particular BH concerns today.  Duration of problem: n/a; Severity of problem: n/a  OBJECTIVE: Mood: Anxious and Affect: Appropriate Risk of harm to self or others: No plan to harm self or others  LIFE CONTEXT: Family and Social: - School/Work: - Self-Care: - Life Changes: Current pregnancy; new HSV diagnosis  GOALS ADDRESSED: Patient will: 1. Increase knowledge and/or ability of: healthy habits  2. Demonstrate ability to: Increase healthy adjustment to current life circumstances  INTERVENTIONS: Interventions utilized: Psychoeducation and/or Health Education  Standardized Assessments completed: GAD-7 and PHQ 9  ASSESSMENT: Patient currently experiencing Supervision of high risk pregnancy, antepartum.   Patient may benefit from Initial OB introduction to integrated behavioral health services .  PLAN: 1. Follow up with behavioral health clinician on : As needed; consider return visit for stress management to help prevent outbreaks  2. Behavioral recommendations:  -Begin taking prenatal vitamin, as recommended by medical provider 3. Referral(s): Integrated [employer] (In Clinic) 4. "From scale of 1-10, how likely are you to follow plan?": -  [name], LCSW  Depression screen [facility] 2/9 03/24/2016  Decreased Interest 0  Down,  Depressed, Hopeless 0  PHQ - 2 Score 0  Altered sleeping 3  Tired, decreased energy 0  Change in appetite 3  Feeling bad or failure about yourself  0  Trouble concentrating 0  Moving slowly or fidgety/restless 0  Suicidal thoughts 0  PHQ-9 Score 6   GAD 7 : Generalized Anxiety Score 03/24/2016  Nervous, Anxious, on Edge 0  Control/stop worrying 0  Worry too much - different things 0  Trouble relaxing 2  Restless 0  Easily annoyed or irritable 0  Afraid - awful might happen 0  Total GAD 7 Score 2

## 2018-08-25 NOTE — Patient Instructions (Signed)
Return to clinic for any scheduled appointments or obstetric concerns, or go to [facility] for evaluation    Third Trimester of Pregnancy The third trimester is from week 28 through week 40 (months 7 through 9). The third trimester is a time when the unborn baby (fetus) is growing rapidly. At the end of the ninth month, the fetus is about 20 inches in length and weighs 6-10 pounds. Body changes during your third trimester Your body will continue to go through many changes during pregnancy. The changes vary from woman to woman. During the third trimester:  Your weight will continue to increase. You can expect to gain 25-35 pounds (11-16 kg) by the end of the pregnancy.  You may begin to get stretch marks on your hips, abdomen, and breasts.  You may urinate more often because the fetus is moving lower into your pelvis and pressing on your bladder.  You may develop or continue to have heartburn. This is caused by increased hormones that slow down muscles in the digestive tract.  You may develop or continue to have constipation because increased hormones slow digestion and cause the muscles that push waste through your intestines to relax.  You may develop hemorrhoids. These are swollen veins (varicose veins) in the rectum that can itch or be painful.  You may develop swollen, bulging veins (varicose veins) in your legs.  You may have increased body aches in the pelvis, back, or thighs. This is due to weight gain and increased hormones that are relaxing your joints.  You may have changes in your hair. These can include thickening of your hair, rapid growth, and changes in texture. Some women also have hair loss during or after pregnancy, or hair that feels dry or thin. Your hair will most likely return to normal after your baby is born.  Your breasts will continue to grow and they will continue to become tender. A yellow fluid (colostrum) may leak from your breasts. This is the first milk you are  producing for your baby.  Your belly button may stick out.  You may notice more swelling in your hands, face, or ankles.  You may have increased tingling or numbness in your hands, arms, and legs. The skin on your belly may also feel numb.  You may feel short of breath because of your expanding uterus.  You may have more problems sleeping. This can be caused by the size of your belly, increased need to urinate, and an increase in your body's metabolism.  You may notice the fetus "dropping," or moving lower in your abdomen (lightening).  You may have increased vaginal discharge.  You may notice your joints feel loose and you may have pain around your pelvic bone. What to expect at prenatal visits You will have prenatal exams every 2 weeks until week 36. Then you will have weekly prenatal exams. During a routine prenatal visit:  You will be weighed to make sure you and the baby are growing normally.  Your blood pressure will be taken.  Your abdomen will be measured to track your baby's growth.  The fetal heartbeat will be listened to.  Any test results from the previous visit will be discussed.  You may have a cervical check near your due date to see if your cervix has softened or thinned (effaced).  You will be tested for Group B streptococcus. This happens between 35 and 37 weeks. Your health care provider may ask you:  What your birth plan is.    How you are feeling.  If you are feeling the baby move.  If you have had any abnormal symptoms, such as leaking fluid, bleeding, severe headaches, or abdominal cramping.  If you are using any tobacco products, including cigarettes, chewing tobacco, and electronic cigarettes.  If you have any questions. Other tests or screenings that may be performed during your third trimester include:  Blood tests that check for low iron levels (anemia).  Fetal testing to check the health, activity level, and growth of the fetus. Testing is  done if you have certain medical conditions or if there are problems during the pregnancy.  Nonstress test (NST). This test checks the health of your baby to make sure there are no signs of problems, such as the baby not getting enough oxygen. During this test, a belt is placed around your belly. The baby is made to move, and its heart rate is monitored during movement. What is false labor? False labor is a condition in which you feel small, irregular tightenings of the muscles in the womb (contractions) that usually go away with rest, changing position, or drinking water. These are called Braxton Hicks contractions. Contractions may last for hours, days, or even weeks before true labor sets in. If contractions come at regular intervals, become more frequent, increase in intensity, or become painful, you should see your health care provider. What are the signs of labor?  Abdominal cramps.  Regular contractions that start at 10 minutes apart and become stronger and more frequent with time.  Contractions that start on the top of the uterus and spread down to the lower abdomen and back.  Increased pelvic pressure and dull back pain.  A watery or bloody mucus discharge that comes from the vagina.  Leaking of amniotic fluid. This is also known as your "water breaking." It could be a slow trickle or a gush. Let your health care provider know if it has a color or strange odor. If you have any of these signs, call your health care provider right away, even if it is before your due date. Follow these instructions at home: Medicines  Follow your health care provider's instructions regarding medicine use. Specific medicines may be either safe or unsafe to take during pregnancy.  Take a prenatal vitamin that contains at least 600 micrograms (mcg) of folic acid.  If you develop constipation, try taking a stool softener if your health care provider approves. Eating and drinking   Eat a balanced diet  that includes fresh fruits and vegetables, whole grains, good sources of protein such as meat, eggs, or tofu, and low-fat dairy. Your health care provider will help you determine the amount of weight gain that is right for you.  Avoid raw meat and uncooked cheese. These carry germs that can cause birth defects in the baby.  If you have low calcium intake from food, talk to your health care provider about whether you should take a daily calcium supplement.  Eat four or five small meals rather than three large meals a day.  Limit foods that are high in fat and processed sugars, such as fried and sweet foods.  To prevent constipation: ? Drink enough fluid to keep your urine clear or pale yellow. ? Eat foods that are high in fiber, such as fresh fruits and vegetables, whole grains, and beans. Activity  Exercise only as directed by your health care provider. Most women can continue their usual exercise routine during pregnancy. Try to exercise for 30 minutes   at least 5 days a week. Stop exercising if you experience uterine contractions.  Avoid heavy lifting.  Do not exercise in extreme heat or humidity, or at high altitudes.  Wear low-heel, comfortable shoes.  Practice good posture.  You may continue to have sex unless your health care provider tells you otherwise. Relieving pain and discomfort  Take frequent breaks and rest with your legs elevated if you have leg cramps or low back pain.  Take warm sitz baths to soothe any pain or discomfort caused by hemorrhoids. Use hemorrhoid cream if your health care provider approves.  Wear a good support bra to prevent discomfort from breast tenderness.  If you develop varicose veins: ? Wear support pantyhose or compression stockings as told by your healthcare provider. ? Elevate your feet for 15 minutes, 3-4 times a day. Prenatal care  Write down your questions. Take them to your prenatal visits.  Keep all your prenatal visits as told by  your health care provider. This is important. Safety  Wear your seat belt at all times when driving.  Make a list of emergency phone numbers, including numbers for family, friends, the hospital, and police and fire departments. General instructions  Avoid cat litter boxes and soil used by cats. These carry germs that can cause birth defects in the baby. If you have a cat, ask someone to clean the litter box for you.  Do not travel far distances unless it is absolutely necessary and only with the approval of your health care provider.  Do not use hot tubs, steam rooms, or saunas.  Do not drink alcohol.  Do not use any products that contain nicotine or tobacco, such as cigarettes and e-cigarettes. If you need help quitting, ask your health care provider.  Do not use any medicinal herbs or unprescribed drugs. These chemicals affect the formation and growth of the baby.  Do not douche or use tampons or scented sanitary pads.  Do not cross your legs for long periods of time.  To prepare for the arrival of your baby: ? Take prenatal classes to understand, practice, and ask questions about labor and delivery. ? Make a trial run to the hospital. ? Visit the hospital and tour the maternity area. ? Arrange for maternity or paternity leave through employers. ? Arrange for family and friends to take care of pets while you are in the hospital. ? Purchase a rear-facing car seat and make sure you know how to install it in your car. ? Pack your hospital bag. ? Prepare the baby's nursery. Make sure to remove all pillows and stuffed animals from the baby's crib to prevent suffocation.  Visit your dentist if you have not gone during your pregnancy. Use a soft toothbrush to brush your teeth and be gentle when you floss. Contact a health care provider if:  You are unsure if you are in labor or if your water has broken.  You become dizzy.  You have mild pelvic cramps, pelvic pressure, or nagging  pain in your abdominal area.  You have lower back pain.  You have persistent nausea, vomiting, or diarrhea.  You have an unusual or bad smelling vaginal discharge.  You have pain when you urinate. Get help right away if:  Your water breaks before 37 weeks.  You have regular contractions less than 5 minutes apart before 37 weeks.  You have a fever.  You are leaking fluid from your vagina.  You have spotting or bleeding from your vagina.    You have severe abdominal pain or cramping.  You have rapid weight loss or weight gain.  You have shortness of breath with chest pain.  You notice sudden or extreme swelling of your face, hands, ankles, feet, or legs.  Your baby makes fewer than 10 movements in 2 hours.  You have severe headaches that do not go away when you take medicine.  You have vision changes. Summary  The third trimester is from week 28 through week 40, months 7 through 9. The third trimester is a time when the unborn baby (fetus) is growing rapidly.  During the third trimester, your discomfort may increase as you and your baby continue to gain weight. You may have abdominal, leg, and back pain, sleeping problems, and an increased need to urinate.  During the third trimester your breasts will keep growing and they will continue to become tender. A yellow fluid (colostrum) may leak from your breasts. This is the first milk you are producing for your baby.  False labor is a condition in which you feel small, irregular tightenings of the muscles in the womb (contractions) that eventually go away. These are called Braxton Hicks contractions. Contractions may last for hours, days, or even weeks before true labor sets in.  Signs of labor can include: abdominal cramps; regular contractions that start at 10 minutes apart and become stronger and more frequent with time; watery or bloody mucus discharge that comes from the vagina; increased pelvic pressure and dull back pain;  and leaking of amniotic fluid. This information is not intended to replace advice given to you by your health care provider. Make sure you discuss any questions you have with your health care provider. Document Released: 07/15/2001 Document Revised: 08/26/2016 Document Reviewed: 08/26/2016 Elsevier Interactive Patient Education  2019 [employer]  

## 2018-09-06 ENCOUNTER — Encounter: Payer: Self-pay | Admitting: *Deleted

## 2018-09-07 LAB — COMPREHENSIVE METABOLIC PANEL
ALT: 6 IU/L (ref 0–32)
AST: 8 IU/L (ref 0–40)
Albumin/Globulin Ratio: 1.2 (ref 1.2–2.2)
Albumin: 3.7 g/dL — ABNORMAL LOW (ref 3.9–5.0)
Alkaline Phosphatase: 91 IU/L (ref 39–117)
BUN/Creatinine Ratio: 10 (ref 9–23)
BUN: 6 mg/dL (ref 6–20)
Bilirubin Total: 0.2 mg/dL (ref 0.0–1.2)
CO2: 19 mmol/L — ABNORMAL LOW (ref 20–29)
Calcium: 8.7 mg/dL (ref 8.7–10.2)
Chloride: 100 mmol/L (ref 96–106)
Creatinine, Ser: 0.6 mg/dL (ref 0.57–1.00)
GFR calc Af Amer: 142 mL/min/{1.73_m2} (ref >59)
GFR calc non Af Amer: 124 mL/min/{1.73_m2} (ref >59)
Globulin, Total: 3 g/dL (ref 1.5–4.5)
Glucose: 83 mg/dL (ref 65–99)
POTASSIUM: 3.9 mmol/L (ref 3.5–5.2)
Sodium: 138 mmol/L (ref 134–144)
Total Protein: 6.7 g/dL (ref 6.0–8.5)

## 2018-09-07 LAB — HEMOGLOBINOPATHY EVALUATION
FERRITIN: 32 ng/mL (ref 15–150)
Hematocrit: 31.7 % — ABNORMAL LOW (ref 34.0–46.6)
Hemoglobin: 10.8 g/dL — ABNORMAL LOW (ref 11.1–15.9)
Hgb A2 Quant: 1.2 % — ABNORMAL LOW (ref 1.8–3.2)
Hgb A: 97.5 % (ref 96.4–98.8)
Hgb C: 0 %
Hgb F Quant: 0 % (ref 0.0–2.0)
Hgb S: 0 %
Hgb Solubility: NEGATIVE
Hgb Variant: 1.3 % — ABNORMAL HIGH
MCH: 30.2 pg (ref 26.6–33.0)
MCHC: 34.1 g/dL (ref 31.5–35.7)
MCV: 89 fL (ref 79–97)
Platelets: 257 10*3/uL (ref 150–450)
RBC: 3.58 x10E6/uL — ABNORMAL LOW (ref 3.77–5.28)
RDW: 13.3 % (ref 11.7–15.4)
WBC: 6.8 10*3/uL (ref 3.4–10.8)

## 2018-09-07 LAB — INHERITEST(R) CF/SMA PANEL

## 2018-09-07 LAB — HEPATITIS B E ANTIGEN: Hep B E Ag: NEGATIVE

## 2018-09-07 LAB — HEPATITIS B DNA, ULTRAQUANTITATIVE, PCR
HBV DNA SERPL PCR-ACNC: 30 [IU]/mL
HBV DNA SERPL PCR-LOG IU: 1.477 log10 IU/mL

## 2018-09-07 LAB — HEPATITIS B E ANTIBODY: Hep B E Ab: POSITIVE — AB

## 2018-09-10 ENCOUNTER — Encounter: Payer: Self-pay | Admitting: Obstetrics and Gynecology

## 2018-09-10 ENCOUNTER — Other Ambulatory Visit: Payer: Medicaid Other

## 2018-09-10 ENCOUNTER — Ambulatory Visit (INDEPENDENT_AMBULATORY_CARE_PROVIDER_SITE_OTHER): Payer: Medicaid Other | Admitting: Obstetrics and Gynecology

## 2018-09-10 VITALS — BP 125/82 | HR 94 | Wt 216.6 lb

## 2018-09-10 DIAGNOSIS — O0993 Supervision of high risk pregnancy, unspecified, third trimester: Secondary | ICD-10-CM

## 2018-09-10 DIAGNOSIS — O2303 Infections of kidney in pregnancy, third trimester: Secondary | ICD-10-CM

## 2018-09-10 DIAGNOSIS — O2300 Infections of kidney in pregnancy, unspecified trimester: Secondary | ICD-10-CM

## 2018-09-10 DIAGNOSIS — O98419 Viral hepatitis complicating pregnancy, unspecified trimester: Secondary | ICD-10-CM

## 2018-09-10 DIAGNOSIS — B1910 Unspecified viral hepatitis B without hepatic coma: Secondary | ICD-10-CM

## 2018-09-10 DIAGNOSIS — B009 Herpesviral infection, unspecified: Secondary | ICD-10-CM

## 2018-09-10 DIAGNOSIS — O98513 Other viral diseases complicating pregnancy, third trimester: Secondary | ICD-10-CM

## 2018-09-10 DIAGNOSIS — R8271 Bacteriuria: Secondary | ICD-10-CM

## 2018-09-10 MED ORDER — CEPHALEXIN 500 MG PO CAPS: 500.0000 mg | ORAL_CAPSULE | Freq: Every day | ORAL | 3 refills | Status: DC

## 2018-09-10 NOTE — Patient Instructions (Signed)
Contraception Choices Contraception, also called birth control, refers to methods or devices that prevent pregnancy. Hormonal methods Contraceptive implant A contraceptive implant is a thin, plastic tube that contains a hormone. It is inserted into the upper part of the arm. It can remain in place for up to 3 years. Progestin-only injections Progestin-only injections are injections of progestin, a synthetic form of the hormone progesterone. They are given every 3 months by a health care provider. Birth control pills Birth control pills are pills that contain hormones that prevent pregnancy. They must be taken once a day, preferably at the same time each day. Birth control patch The birth control patch contains hormones that prevent pregnancy. It is placed on the skin and must be changed once a week for three weeks and removed on the fourth week. A prescription is needed to use this method of contraception. Vaginal ring A vaginal ring contains hormones that prevent pregnancy. It is placed in the vagina for three weeks and removed on the fourth week. After that, the process is repeated with a new ring. A prescription is needed to use this method of contraception. Emergency contraceptive Emergency contraceptives prevent pregnancy after unprotected sex. They come in pill form and can be taken up to 5 days after sex. They work best the sooner they are taken after having sex. Most emergency contraceptives are available without a prescription. This method should not be used as your only form of birth control. Barrier methods Female condom A female condom is a thin sheath that is worn over the penis during sex. Condoms keep sperm from going inside a woman's body. They can be used with a spermicide to increase their effectiveness. They should be disposed after a single use. Female condom A female condom is a soft, loose-fitting sheath that is put into the vagina before sex. The condom keeps sperm from going  inside a woman's body. They should be disposed after a single use.  Intrauterine contraception Intrauterine device (IUD) An IUD is a T-shaped device that is put in a woman's uterus. There are two types:  Hormone IUD.This type contains progestin, a synthetic form of the hormone progesterone. This type can stay in place for 3-5 years.  Copper IUD.This type is wrapped in copper wire. It can stay in place for 10 years.  Permanent methods of contraception Female tubal ligation In this method, a woman's fallopian tubes are sealed, tied, or blocked during surgery to prevent eggs from traveling to the uterus.  Female sterilization This is a procedure to tie off the tubes that carry sperm (vasectomy). After the procedure, the man can still ejaculate fluid (semen).  Summary  Contraception, also called birth control, means methods or devices that prevent pregnancy.  Hormonal methods of contraception include implants, injections, pills, patches, vaginal rings, and emergency contraceptives.  Barrier methods of contraception can include female condoms, female condoms, diaphragms, cervical caps, sponges, and spermicides.  There are two types of IUDs (intrauterine devices). An IUD can be put in a woman's uterus to prevent pregnancy for 3-5 years.  Permanent sterilization can be done through a procedure for males, females, or both. This information is not intended to replace advice given to you by your health care provider. Make sure you discuss any questions you have with your health care provider. Document Released: 07/21/2005 Document Revised: 08/23/2016 Document Reviewed: 08/23/2016 Elsevier Interactive Patient Education  2018 Elsevier Inc.  

## 2018-09-10 NOTE — Progress Notes (Signed)
   PRENATAL VISIT NOTE  Subjective:  Lauren Moss is a 28 y.o. [id number] at [redacted]w[redacted]d being seen today for ongoing prenatal care.  She is currently monitored for the following issues for this high-risk pregnancy and has Depression; Bipolar disorder (HCC); Hep B complicating pregnancy; History of gestational diabetes in prior pregnancy, currently pregnant in second trimester; Supervision of high risk pregnancy in third trimester; Late prenatal care affecting pregnancy; GBS bacteriuria; Marijuana use; Pyelonephritis affecting pregnancy, antepartum; and HSV-2 infection complicating pregnancy on their problem list.  Patient reports occasional contractions.  Contractions: Irritability. Vag. Bleeding: None.  Movement: Present. Denies leaking of fluid.   The following portions of the patient's history were reviewed and updated as appropriate: allergies, current medications, past family history, past medical history, past social history, past surgical history and problem list. Problem list updated.  Objective:   Vitals:   09/10/18 1050  BP: 125/82  Pulse: 94  Weight: 216 lb 9.6 oz (98.2 kg)    Fetal Status:     Movement: Present     General:  Alert, oriented and cooperative. Patient is in no acute distress.  Skin: Skin is warm and dry. No rash noted.   Cardiovascular: Normal heart rate noted  Respiratory: Normal respiratory effort, no problems with respiration noted  Abdomen: Soft, gravid, appropriate for gestational age.  Pain/Pressure: Present     Pelvic: Cervical exam deferred       no visible lesions, extremely tender at right vulva and perineum  Extremities: Normal range of motion.  Edema: None  Mental Status: Normal mood and affect. Normal behavior. Normal judgment and thought content.   Assessment and Plan:  Pregnancy: [id number] at [redacted]w[redacted]d  1. Supervision of high risk pregnancy in third trimester 2 hr GTT today  2. GBS bacteriuria ppx in labor  3. 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 Pyelonephritis affecting pregnancy, antepartum Keflex suppression sent to pharmacy - Culture, OB Urine  5. Hep B complicating pregnancy chronic LFTs wnl Will recheck VL around 36 weeks   Preterm labor symptoms and general obstetric precautions including but not limited to vaginal bleeding, contractions, leaking of fluid and fetal movement were reviewed in detail with the patient. Please refer to After Visit Summary for other counseling recommendations.  Return in about 2 weeks (around 09/24/2018) for OB visit (MD).  Future Appointments  Date Time Provider Department Center  09/13/2018  2:15 PM [facility] [place] [facility]    Conan Bowens, MD

## 2018-09-11 LAB — GLUCOSE TOLERANCE, 2 HOURS W/ 1HR
GLUCOSE, 1 HOUR: 155 mg/dL (ref 65–179)
Glucose, 2 hour: 101 mg/dL (ref 65–152)
Glucose, Fasting: 77 mg/dL (ref 65–91)

## 2018-09-12 LAB — URINE CULTURE, OB REFLEX

## 2018-09-12 LAB — CULTURE, OB URINE

## 2018-09-13 ENCOUNTER — Ambulatory Visit (HOSPITAL_COMMUNITY)
Admission: RE | Admit: 2018-09-13 | Discharge: 2018-09-13 | Disposition: A | Discharge status: Home / Self Care | Payer: Medicaid Other | Source: Ambulatory Visit | Attending: Obstetrics & Gynecology | Admitting: Obstetrics & Gynecology

## 2018-09-13 DIAGNOSIS — O0933 Supervision of pregnancy with insufficient antenatal care, third trimester: Secondary | ICD-10-CM

## 2018-09-13 DIAGNOSIS — B1910 Unspecified viral hepatitis B without hepatic coma: Secondary | ICD-10-CM

## 2018-09-13 DIAGNOSIS — O09293 Supervision of pregnancy with other poor reproductive or obstetric history, third trimester: Secondary | ICD-10-CM

## 2018-09-13 DIAGNOSIS — Z0489 Encounter for examination and observation for other specified reasons: Secondary | ICD-10-CM | POA: Insufficient documentation

## 2018-09-13 DIAGNOSIS — O98413 Viral hepatitis complicating pregnancy, third trimester: Secondary | ICD-10-CM

## 2018-09-13 DIAGNOSIS — Z362 Encounter for other antenatal screening follow-up: Secondary | ICD-10-CM | POA: Diagnosis not present

## 2018-09-13 DIAGNOSIS — IMO0002 Reserved for concepts with insufficient information to code with codable children: Secondary | ICD-10-CM

## 2018-09-13 DIAGNOSIS — O99213 Obesity complicating pregnancy, third trimester: Secondary | ICD-10-CM

## 2018-09-20 ENCOUNTER — Encounter: Payer: Self-pay | Admitting: *Deleted

## 2018-09-28 ENCOUNTER — Encounter: Payer: Self-pay | Admitting: Obstetrics & Gynecology

## 2018-10-12 ENCOUNTER — Encounter: Payer: Self-pay | Admitting: Internal Medicine

## 2018-10-15 ENCOUNTER — Telehealth: Payer: Self-pay | Admitting: Family Medicine

## 2018-10-15 NOTE — Telephone Encounter (Signed)
The patient delivered her child in [place]. The physician called our clinic for records. Informed the physician of faxing a release to our clinic and educated of our hours of operation.

## 2018-10-27 ENCOUNTER — Encounter: Payer: Self-pay | Admitting: Family Medicine

## 2018-11-02 ENCOUNTER — Telehealth: Payer: Self-pay | Admitting: Family Medicine

## 2018-11-02 NOTE — Telephone Encounter (Signed)
Called the patient to inform of the COVID19 restrictions and the [employer] app. "Recevied a message the call can not be completed at this time." No point of contact for the patient.

## 2018-11-03 ENCOUNTER — Encounter: Payer: Self-pay | Admitting: Family Medicine

## 2018-11-05 ENCOUNTER — Telehealth: Payer: Self-pay | Admitting: Obstetrics and Gynecology

## 2018-11-05 NOTE — Telephone Encounter (Signed)
Called the patient to inform of COVID19 restrictions. Received a message "we're sorry your call can not be completed at this time."

## 2018-11-08 ENCOUNTER — Encounter: Payer: Self-pay | Admitting: Family Medicine

## 2018-11-08 NOTE — Telephone Encounter (Signed)
Attempted to reach out to the patient regarding the missed appointment, however I reached a non-working number. Mailing the patient a missed appointment letter.

## 2018-11-10 ENCOUNTER — Encounter: Payer: Self-pay | Admitting: Obstetrics and Gynecology

## 2018-11-16 NOTE — Telephone Encounter (Signed)
Called the patient to inform of upcoming appointment. Non-working number. Received a message stating your call can not be completed at this time.

## 2018-11-17 ENCOUNTER — Encounter: Payer: Self-pay | Admitting: Obstetrics & Gynecology

## 2019-02-05 ENCOUNTER — Encounter (HOSPITAL_COMMUNITY): Payer: Self-pay

## 2019-03-27 ENCOUNTER — Emergency Department (HOSPITAL_COMMUNITY)
Admission: EM | Admit: 2019-03-27 | Discharge: 2019-03-28 | Disposition: A | Discharge status: Home / Self Care | Payer: Medicaid Other | Attending: Emergency Medicine | Admitting: Emergency Medicine

## 2019-03-27 ENCOUNTER — Other Ambulatory Visit: Payer: Self-pay

## 2019-03-27 DIAGNOSIS — E876 Hypokalemia: Secondary | ICD-10-CM | POA: Insufficient documentation

## 2019-03-27 DIAGNOSIS — R109 Unspecified abdominal pain: Secondary | ICD-10-CM | POA: Insufficient documentation

## 2019-03-27 DIAGNOSIS — R509 Fever, unspecified: Secondary | ICD-10-CM | POA: Insufficient documentation

## 2019-03-27 DIAGNOSIS — Z87891 Personal history of nicotine dependence: Secondary | ICD-10-CM | POA: Insufficient documentation

## 2019-03-27 DIAGNOSIS — R61 Generalized hyperhidrosis: Secondary | ICD-10-CM | POA: Diagnosis not present

## 2019-03-27 DIAGNOSIS — R112 Nausea with vomiting, unspecified: Secondary | ICD-10-CM | POA: Insufficient documentation

## 2019-03-27 DIAGNOSIS — J45909 Unspecified asthma, uncomplicated: Secondary | ICD-10-CM | POA: Insufficient documentation

## 2019-03-27 NOTE — ED Triage Notes (Signed)
Per EMS - Pt coming form home complaining of flank pain. Hx of kidney infections. Has only be able to urinate to 3x in the past 24 hours, in small amounts which is dark and foul smelling. Pt states she has taken 6 tylenol throughout the day and EMS gave 2 more totaling 4000mg   For the day. Pain on both flanks constantly, but spasming on the right. Pt has hx of Hep B.   18 Lt AC  150 92 104 HR 100% RA  23 R 100.6

## 2019-03-28 ENCOUNTER — Emergency Department (HOSPITAL_COMMUNITY): Payer: Medicaid Other

## 2019-03-28 LAB — PROTIME-INR
INR: 1.1 (ref 0.8–1.2)
Prothrombin Time: 14.4 seconds (ref 11.4–15.2)

## 2019-03-28 LAB — CBC WITH DIFFERENTIAL/PLATELET
Abs Immature Granulocytes: 0.04 10*3/uL (ref 0.00–0.07)
Basophils Absolute: 0 10*3/uL (ref 0.0–0.1)
Basophils Relative: 0 %
Eosinophils Absolute: 0 10*3/uL (ref 0.0–0.5)
Eosinophils Relative: 0 %
HCT: 37.4 % (ref 36.0–46.0)
Hemoglobin: 12 g/dL (ref 12.0–15.0)
Immature Granulocytes: 0 %
Lymphocytes Relative: 16 %
Lymphs Abs: 1.5 10*3/uL (ref 0.7–4.0)
MCH: 29.1 pg (ref 26.0–34.0)
MCHC: 32.1 g/dL (ref 30.0–36.0)
MCV: 90.8 fL (ref 80.0–100.0)
Monocytes Absolute: 1 10*3/uL (ref 0.1–1.0)
Monocytes Relative: 11 %
Neutro Abs: 6.6 10*3/uL (ref 1.7–7.7)
Neutrophils Relative %: 73 %
Platelets: 192 10*3/uL (ref 150–400)
RBC: 4.12 MIL/uL (ref 3.87–5.11)
RDW: 17.1 % — ABNORMAL HIGH (ref 11.5–15.5)
WBC: 9.1 10*3/uL (ref 4.0–10.5)
nRBC: 0 % (ref 0.0–0.2)

## 2019-03-28 LAB — BLOOD CULTURE ID PANEL (REFLEXED)

## 2019-03-28 LAB — URINALYSIS, ROUTINE W REFLEX MICROSCOPIC
Bacteria, UA: NONE SEEN
Bilirubin Urine: NEGATIVE
Glucose, UA: NEGATIVE mg/dL
Hgb urine dipstick: NEGATIVE
Ketones, ur: 80 mg/dL — AB
Leukocytes,Ua: NEGATIVE
Nitrite: NEGATIVE
Protein, ur: 30 mg/dL — AB
Specific Gravity, Urine: 1.023 (ref 1.005–1.030)
pH: 6 (ref 5.0–8.0)

## 2019-03-28 LAB — I-STAT BETA HCG BLOOD, ED (MC, WL, AP ONLY): I-stat hCG, quantitative: 9.6 m[IU]/mL — ABNORMAL HIGH (ref <5)

## 2019-03-28 LAB — COMPREHENSIVE METABOLIC PANEL
ALT: 11 U/L (ref 0–44)
AST: 13 U/L — ABNORMAL LOW (ref 15–41)
Albumin: 3.9 g/dL (ref 3.5–5.0)
Alkaline Phosphatase: 59 U/L (ref 38–126)
Anion gap: 13 (ref 5–15)
BUN: 6 mg/dL (ref 6–20)
CO2: 18 mmol/L — ABNORMAL LOW (ref 22–32)
Calcium: 8.7 mg/dL — ABNORMAL LOW (ref 8.9–10.3)
Chloride: 106 mmol/L (ref 98–111)
Creatinine, Ser: 0.75 mg/dL (ref 0.44–1.00)
GFR calc Af Amer: >60 mL/min (ref >60)
GFR calc non Af Amer: >60 mL/min (ref >60)
Glucose, Bld: 84 mg/dL (ref 70–99)
Potassium: 3 mmol/L — ABNORMAL LOW (ref 3.5–5.1)
Sodium: 137 mmol/L (ref 135–145)
Total Bilirubin: 1.3 mg/dL — ABNORMAL HIGH (ref 0.3–1.2)
Total Protein: 7.6 g/dL (ref 6.5–8.1)

## 2019-03-28 LAB — APTT: aPTT: 39 seconds — ABNORMAL HIGH (ref 24–36)

## 2019-03-28 LAB — LACTIC ACID, PLASMA: Lactic Acid, Venous: 0.6 mmol/L (ref 0.5–1.9)

## 2019-03-28 LAB — HCG, QUANTITATIVE, PREGNANCY: hCG, Beta Chain, Quant, S: <1 m[IU]/mL (ref <5)

## 2019-03-28 MED ORDER — ONDANSETRON HCL 4 MG/2ML IJ SOLN
4.0000 mg | Freq: Once | INTRAMUSCULAR | Status: AC
  Administered 2019-03-28: 4 mg via INTRAVENOUS
  Filled 2019-03-28: qty 2

## 2019-03-28 MED ORDER — SODIUM CHLORIDE 0.9 % IV SOLN
1.0000 g | INTRAVENOUS | Status: DC
  Administered 2019-03-28: 1 g via INTRAVENOUS
  Filled 2019-03-28: qty 10

## 2019-03-28 MED ORDER — POTASSIUM CHLORIDE CRYS ER 20 MEQ PO TBCR
40.0000 meq | EXTENDED_RELEASE_TABLET | Freq: Once | ORAL | Status: AC
  Administered 2019-03-28: 40 meq via ORAL
  Filled 2019-03-28: qty 2

## 2019-03-28 MED ORDER — SODIUM CHLORIDE 0.9 % IV BOLUS
1000.0000 mL | Freq: Once | INTRAVENOUS | Status: AC
  Administered 2019-03-28: 1000 mL via INTRAVENOUS

## 2019-03-28 MED ORDER — POTASSIUM CHLORIDE CRYS ER 20 MEQ PO TBCR: 20.0000 meq | EXTENDED_RELEASE_TABLET | Freq: Two times a day (BID) | ORAL | 0 refills | Status: DC

## 2019-03-28 MED ORDER — MORPHINE SULFATE (PF) 4 MG/ML IV SOLN
4.0000 mg | Freq: Once | INTRAVENOUS | Status: AC
  Administered 2019-03-28: 01:00:00 4 mg via INTRAVENOUS
  Filled 2019-03-28: qty 1

## 2019-03-28 MED ORDER — METOCLOPRAMIDE HCL 10 MG PO TABS: 10.0000 mg | ORAL_TABLET | Freq: Four times a day (QID) | ORAL | 0 refills | Status: DC | PRN

## 2019-03-28 MED ORDER — DIPHENHYDRAMINE HCL 50 MG/ML IJ SOLN
25.0000 mg | Freq: Once | INTRAMUSCULAR | Status: AC
  Administered 2019-03-28: 05:00:00 25 mg via INTRAVENOUS
  Filled 2019-03-28: qty 1

## 2019-03-28 MED ORDER — METOCLOPRAMIDE HCL 5 MG/ML IJ SOLN
10.0000 mg | Freq: Once | INTRAMUSCULAR | Status: AC
  Administered 2019-03-28: 10 mg via INTRAVENOUS
  Filled 2019-03-28: qty 2

## 2019-03-28 MED ORDER — MORPHINE SULFATE (PF) 4 MG/ML IV SOLN
4.0000 mg | Freq: Once | INTRAVENOUS | Status: AC
  Administered 2019-03-28: 02:00:00 4 mg via INTRAVENOUS
  Filled 2019-03-28: qty 1

## 2019-03-28 MED ORDER — POTASSIUM CHLORIDE 10 MEQ/100ML IV SOLN
10.0000 meq | Freq: Once | INTRAVENOUS | Status: AC
  Administered 2019-03-28: 05:00:00 10 meq via INTRAVENOUS
  Filled 2019-03-28: qty 100

## 2019-03-28 NOTE — Discharge Instructions (Signed)
Return if symptoms are getting worse. °
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
## 2019-03-28 NOTE — ED Notes (Signed)
Pt unable to give urine sample at this time. Gave pt oral fluids, pt will try again shortly.

## 2019-03-28 NOTE — ED Notes (Signed)
Discharge instructions reviewed with patient. Patient verbalizes understanding. VSS.   

## 2019-03-29 ENCOUNTER — Telehealth (HOSPITAL_BASED_OUTPATIENT_CLINIC_OR_DEPARTMENT_OTHER): Payer: Self-pay | Admitting: Emergency Medicine

## 2019-03-29 LAB — URINE CULTURE: Culture: NO GROWTH

## 2019-03-29 NOTE — Telephone Encounter (Signed)
Received positive blood culture results from lab. Consulted with Dr. Stark Jock and advised results were likely contaminant and no further action was necessary.

## 2019-03-30 LAB — CULTURE, BLOOD (ROUTINE X 2): Special Requests: ADEQUATE

## 2019-03-31 ENCOUNTER — Telehealth: Payer: Self-pay

## 2019-03-31 NOTE — Telephone Encounter (Signed)
Post ED Visit - Positive Culture Follow-up: Unsuccessful Patient Follow-up  Culture assessed and recommendations reviewed by:  []  Elenor Quinones, Pharm.D. []  Heide Guile, Pharm.D., BCPS AQ-ID []  Parks Neptune, Pharm.D., BCPS []  Alycia Rossetti, Pharm.D., BCPS []  Elsah, Pharm.D., BCPS, AAHIVP []  Legrand Como, Pharm.D., BCPS, AAHIVP []  Wynell Balloon, PharmD []  Vincenza Hews, PharmD, BCPS Leticia Clas Pharm D Positive [facility] culture For Symptom check  May need to F/U with PCP []  Patient discharged without antimicrobial prescription and treatment is now indicated []  Organism is resistant to prescribed ED discharge antimicrobial []  Patient with positive blood cultures   Unable to contact patient after 3 attempts, letter will be sent to address on file  Genia Del 03/31/2019, 2:38 PM

## 2019-04-02 LAB — CULTURE, BLOOD (ROUTINE X 2)
Culture: NO GROWTH
Special Requests: ADEQUATE

## 2019-04-03 ENCOUNTER — Encounter (HOSPITAL_COMMUNITY): Payer: Self-pay

## 2019-04-03 ENCOUNTER — Emergency Department (HOSPITAL_COMMUNITY)
Admission: EM | Admit: 2019-04-03 | Discharge: 2019-04-03 | Disposition: A | Discharge status: Home / Self Care | Payer: Medicaid Other | Attending: Emergency Medicine | Admitting: Emergency Medicine

## 2019-04-03 ENCOUNTER — Other Ambulatory Visit: Payer: Self-pay

## 2019-04-03 DIAGNOSIS — K0889 Other specified disorders of teeth and supporting structures: Secondary | ICD-10-CM | POA: Insufficient documentation

## 2019-04-03 DIAGNOSIS — J45909 Unspecified asthma, uncomplicated: Secondary | ICD-10-CM | POA: Insufficient documentation

## 2019-04-03 DIAGNOSIS — Z87891 Personal history of nicotine dependence: Secondary | ICD-10-CM | POA: Diagnosis not present

## 2019-04-03 MED ORDER — HYDROCODONE-ACETAMINOPHEN 5-325 MG PO TABS
1.0000 | ORAL_TABLET | Freq: Once | ORAL | Status: AC
  Administered 2019-04-03: 1 via ORAL
  Filled 2019-04-03: qty 1

## 2019-04-03 MED ORDER — HYDROCODONE-ACETAMINOPHEN 5-325 MG PO TABS: 1.0000 | ORAL_TABLET | ORAL | 0 refills | Status: DC | PRN

## 2019-04-03 MED ORDER — PENICILLIN V POTASSIUM 500 MG PO TABS: 500.0000 mg | ORAL_TABLET | Freq: Four times a day (QID) | ORAL | 0 refills | Status: AC

## 2019-04-03 NOTE — Discharge Instructions (Signed)
Follow-up with dentistry.  Take the pain medicine as prescribed.  Do not drive or operate heavy machinery while taking the medicine.  This medicine may make you sleepy and can become addictive.

## 2019-04-03 NOTE — ED Triage Notes (Signed)
Patient coming with dental pain lasting a few days. Her jaw and throat are painful too. She was seen at [facility] 8/23 for flank pain and was treated for infected and "twisted intestines". Now concerned the infection has spread to her tooth/jaw.

## 2019-04-03 NOTE — ED Notes (Signed)
Discharge instructions discussed with pt. Pt verbalized understanding. Pt stable and ambulatory. No signature pad available. 
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
## 2019-04-04 ENCOUNTER — Telehealth: Payer: Self-pay | Admitting: *Deleted

## 2019-04-04 NOTE — Telephone Encounter (Signed)
Pt called regarding Rx.  Pt states she called pharmacy and pharmacy staff did not have Rx.  [facility] called pharmacy, spoke to [place], [place] D who states Rx was received, filled and ready for pick-up.  [facility] informed pt that Rx was ready.

## 2019-11-20 ENCOUNTER — Other Ambulatory Visit: Payer: Self-pay

## 2019-11-20 ENCOUNTER — Inpatient Hospital Stay (HOSPITAL_COMMUNITY)
Admission: AD | Admit: 2019-11-20 | Discharge: 2019-11-20 | Disposition: A | Discharge status: Home / Self Care | Payer: Medicaid Other | Attending: Obstetrics and Gynecology | Admitting: Obstetrics and Gynecology

## 2019-11-20 ENCOUNTER — Encounter (HOSPITAL_COMMUNITY): Payer: Self-pay | Admitting: Obstetrics and Gynecology

## 2019-11-20 DIAGNOSIS — O98311 Other infections with a predominantly sexual mode of transmission complicating pregnancy, first trimester: Secondary | ICD-10-CM | POA: Diagnosis not present

## 2019-11-20 DIAGNOSIS — Z3A01 Less than 8 weeks gestation of pregnancy: Secondary | ICD-10-CM | POA: Insufficient documentation

## 2019-11-20 DIAGNOSIS — A609 Anogenital herpesviral infection, unspecified: Secondary | ICD-10-CM

## 2019-11-20 DIAGNOSIS — O210 Mild hyperemesis gravidarum: Secondary | ICD-10-CM | POA: Insufficient documentation

## 2019-11-20 DIAGNOSIS — Z87891 Personal history of nicotine dependence: Secondary | ICD-10-CM | POA: Diagnosis not present

## 2019-11-20 DIAGNOSIS — O98511 Other viral diseases complicating pregnancy, first trimester: Secondary | ICD-10-CM

## 2019-11-20 DIAGNOSIS — O219 Vomiting of pregnancy, unspecified: Secondary | ICD-10-CM | POA: Diagnosis not present

## 2019-11-20 DIAGNOSIS — A6000 Herpesviral infection of urogenital system, unspecified: Secondary | ICD-10-CM | POA: Insufficient documentation

## 2019-11-20 DIAGNOSIS — B009 Herpesviral infection, unspecified: Secondary | ICD-10-CM

## 2019-11-20 LAB — URINALYSIS, ROUTINE W REFLEX MICROSCOPIC
Bilirubin Urine: NEGATIVE
Glucose, UA: NEGATIVE mg/dL
Hgb urine dipstick: NEGATIVE
Ketones, ur: NEGATIVE mg/dL
Leukocytes,Ua: NEGATIVE
Nitrite: NEGATIVE
Protein, ur: NEGATIVE mg/dL
Specific Gravity, Urine: 1.018 (ref 1.005–1.030)
pH: 6 (ref 5.0–8.0)

## 2019-11-20 LAB — POCT PREGNANCY, URINE: Preg Test, Ur: POSITIVE — AB

## 2019-11-20 MED ORDER — PROMETHAZINE HCL 25 MG/ML IJ SOLN
25.0000 mg | Freq: Once | INTRAMUSCULAR | Status: AC
  Administered 2019-11-20: 19:00:00 25 mg via INTRAMUSCULAR
  Filled 2019-11-20: qty 1

## 2019-11-20 MED ORDER — PROMETHAZINE HCL 25 MG PO TABS: 25.0000 mg | ORAL_TABLET | Freq: Four times a day (QID) | ORAL | 0 refills | Status: DC | PRN

## 2019-11-20 MED ORDER — LIDOCAINE HCL URETHRAL/MUCOSAL 2 % EX GEL
1.0000 "application " | Freq: Once | CUTANEOUS | Status: AC
  Administered 2019-11-20: 1 via TOPICAL
  Filled 2019-11-20: qty 5

## 2019-11-20 MED ORDER — ONDANSETRON 4 MG PO TBDP
8.0000 mg | ORAL_TABLET | Freq: Once | ORAL | Status: AC
  Administered 2019-11-20: 8 mg via ORAL
  Filled 2019-11-20: qty 2

## 2019-11-20 MED ORDER — FAMOTIDINE 20 MG PO TABS: 20.0000 mg | ORAL_TABLET | Freq: Two times a day (BID) | ORAL | 0 refills | Status: AC

## 2019-11-20 MED ORDER — VALACYCLOVIR HCL 500 MG PO TABS: 500.0000 mg | ORAL_TABLET | Freq: Two times a day (BID) | ORAL | 2 refills | Status: AC

## 2019-11-20 MED ORDER — ONDANSETRON 4 MG PO TBDP: 4.0000 mg | ORAL_TABLET | Freq: Three times a day (TID) | ORAL | 0 refills | Status: DC | PRN

## 2019-11-20 NOTE — Discharge Instructions (Signed)
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
## 2019-11-20 NOTE — MAU Note (Signed)
Lauren Moss is a 29 y.o. here in MAU reporting: for the past 2 days she has not been able to keep anything down. Emesis x 4 in the past 24 hours. Feels nauseated currently.  Pt also reporting that she has HSV and that outbreaks are getting worse. SO knows that she has HSV and pt is okay discussing this in front of him. Does not have Rx for valtrex.  LMP: 09/26/19  Onset of complaint: a couple days  Pain score: 8/10  Vitals:   11/20/19 1748  BP: 114/62  Pulse: 83  Resp: 16  Temp: 99.5 F (37.5 C)  SpO2: 98%     Lab orders placed from triage: UA, UPT

## 2019-11-28 ENCOUNTER — Telehealth: Payer: Self-pay | Admitting: Family Medicine

## 2019-11-28 NOTE — Telephone Encounter (Signed)
Patient called in stating that she went to the emergency room because she is nauseous. Patient stated that they gave her a prescription for a limited supply until she can see a doctor. Patient would like a call back to have refill on this prescription or she will go back to the emergency room. Patient instructed that a nurse will give her a call back in regards to this matter. Patient verbalized understanding and message sent to the clinical pool.

## 2019-11-29 MED ORDER — PROMETHAZINE HCL 25 MG PO TABS: 25.0000 mg | ORAL_TABLET | Freq: Four times a day (QID) | ORAL | 0 refills | Status: AC | PRN

## 2019-11-29 MED ORDER — ONDANSETRON 4 MG PO TBDP: 4.0000 mg | ORAL_TABLET | Freq: Three times a day (TID) | ORAL | 0 refills | Status: DC | PRN

## 2019-11-29 NOTE — Addendum Note (Signed)
Addended by: Faythe Casa on: 11/29/2019 03:13 PM   Modules accepted: Orders

## 2019-11-29 NOTE — Telephone Encounter (Signed)
Called pt and left message that the nausea medications that she requested has been e-prescribed to her [facility] on [address].    Addison Naegeli, RN

## 2019-12-08 ENCOUNTER — Telehealth: Payer: Self-pay

## 2019-12-08 DIAGNOSIS — O219 Vomiting of pregnancy, unspecified: Secondary | ICD-10-CM

## 2019-12-08 NOTE — Telephone Encounter (Signed)
Pt called requesting an appt and the need for Zofran refill.

## 2019-12-12 MED ORDER — ONDANSETRON 4 MG PO TBDP: 4.0000 mg | ORAL_TABLET | Freq: Three times a day (TID) | ORAL | 0 refills | Status: DC | PRN

## 2019-12-12 NOTE — Telephone Encounter (Signed)
Per chart refill sen in MAU and given zofran previously. I called Yvone and she states phenergan does not help her nausea and request Zofran refill.  Discussed with Dr. Macon Large and 1 refill approved.  Patient also wants to get new ob appointment. I gave her phone number to call to get scheduled.  Atley Scarboro,RN

## 2019-12-21 ENCOUNTER — Other Ambulatory Visit: Payer: Self-pay

## 2019-12-21 DIAGNOSIS — O219 Vomiting of pregnancy, unspecified: Secondary | ICD-10-CM

## 2019-12-21 MED ORDER — ONDANSETRON 4 MG PO TBDP: 4.0000 mg | ORAL_TABLET | Freq: Three times a day (TID) | ORAL | 0 refills | Status: DC | PRN

## 2019-12-27 ENCOUNTER — Other Ambulatory Visit: Payer: Self-pay

## 2019-12-27 DIAGNOSIS — O219 Vomiting of pregnancy, unspecified: Secondary | ICD-10-CM

## 2019-12-28 ENCOUNTER — Other Ambulatory Visit: Payer: Self-pay

## 2019-12-28 DIAGNOSIS — O219 Vomiting of pregnancy, unspecified: Secondary | ICD-10-CM

## 2019-12-28 MED ORDER — ONDANSETRON 4 MG PO TBDP: 4.0000 mg | ORAL_TABLET | Freq: Three times a day (TID) | ORAL | 0 refills | Status: AC | PRN

## 2020-01-04 ENCOUNTER — Other Ambulatory Visit: Payer: Self-pay

## 2020-01-04 MED ORDER — DOXYLAMINE-PYRIDOXINE 10-10 MG PO TBEC: 2.0000 | DELAYED_RELEASE_TABLET | Freq: Every day | ORAL | 2 refills | Status: AC

## 2020-01-11 ENCOUNTER — Ambulatory Visit (INDEPENDENT_AMBULATORY_CARE_PROVIDER_SITE_OTHER): Payer: Medicaid Other | Admitting: *Deleted

## 2020-01-11 ENCOUNTER — Other Ambulatory Visit: Payer: Self-pay

## 2020-01-11 DIAGNOSIS — Z8759 Personal history of other complications of pregnancy, childbirth and the puerperium: Secondary | ICD-10-CM

## 2020-01-11 DIAGNOSIS — Z87440 Personal history of urinary (tract) infections: Secondary | ICD-10-CM

## 2020-01-11 DIAGNOSIS — R7611 Nonspecific reaction to tuberculin skin test without active tuberculosis: Secondary | ICD-10-CM | POA: Insufficient documentation

## 2020-01-11 DIAGNOSIS — Z8632 Personal history of gestational diabetes: Secondary | ICD-10-CM

## 2020-01-11 DIAGNOSIS — O98419 Viral hepatitis complicating pregnancy, unspecified trimester: Secondary | ICD-10-CM

## 2020-01-11 DIAGNOSIS — O09899 Supervision of other high risk pregnancies, unspecified trimester: Secondary | ICD-10-CM

## 2020-01-11 DIAGNOSIS — B009 Herpesviral infection, unspecified: Secondary | ICD-10-CM

## 2020-01-11 DIAGNOSIS — B1910 Unspecified viral hepatitis B without hepatic coma: Secondary | ICD-10-CM

## 2020-01-11 DIAGNOSIS — O98519 Other viral diseases complicating pregnancy, unspecified trimester: Secondary | ICD-10-CM

## 2020-01-11 DIAGNOSIS — O0990 Supervision of high risk pregnancy, unspecified, unspecified trimester: Secondary | ICD-10-CM

## 2020-01-11 DIAGNOSIS — O09299 Supervision of pregnancy with other poor reproductive or obstetric history, unspecified trimester: Secondary | ICD-10-CM

## 2020-01-11 MED ORDER — BLOOD PRESSURE KIT DEVI: 1.0000 | 0 refills | Status: AC | PRN

## 2020-01-11 NOTE — Progress Notes (Signed)
I connected with  Lauren Moss on 01/11/20 at 11:15 AM EDT by telephone and verified that I am speaking with the correct person using two identifiers.   I discussed the limitations, risks, security and privacy concerns of performing an evaluation and management service by telephone and the availability of in person appointments. I also discussed with the patient that there may be a patient responsible charge related to this service. The patient expressed understanding and agreed to proceed.  I explained I am completing her New OB Intake today. Her pregnancy was confirmed  At MAU at [facility]. We discussed Her EDD and that it is based on  sure LMP  . I reviewed her allergies, meds, OB History, Medical /Surgical history, and appropriate screenings. I informed her of [facility] services.   I explained I would like to send her the Bab[employer]s app but she states she doesn't really have room on her phone unless it is mandatory. I explained it is not mandatory but requested but she could also just keep a log of her blood pressures and report to [place] each time. She chose to keep bp log.   I explained we will send a blood pressure cuff to [facility] that will fill that prescription and we called [facility] to verify they received her prescription and confirmed she will pick up the cuff.  I asked her to bring the blood pressure cuff with her to her first ob appointment so we can show her how to use it. Explained  then we will have her take her blood pressure weekly and keep her log.  I explained she will have some visits in office and some virtually. She already has a MyChart account and does not want to download the  App due to space on her phone.  I reviewed her new ob  appointment date/ time with her , our location and to wear mask, no visitors.  I explained she will have a pelvic exam, ob bloodwork, hemoglobin a1C, cbg ,pap, and  genetic testing if desired,- she is unsure about the  panorama. I scheduled an  [place] at 19 weeks and gave her the appointment. She voices understanding.   Zackeriah Kissler,RN 01/11/2020  11:30 AM

## 2020-01-11 NOTE — Patient Instructions (Signed)

## 2020-01-16 ENCOUNTER — Encounter: Payer: Medicaid Other | Admitting: Obstetrics and Gynecology

## 2020-02-07 ENCOUNTER — Ambulatory Visit: Payer: Medicaid Other

## 2020-02-07 ENCOUNTER — Other Ambulatory Visit: Payer: Self-pay

## 2020-03-06 ENCOUNTER — Telehealth: Payer: Self-pay | Admitting: Family Medicine

## 2020-03-06 NOTE — Telephone Encounter (Signed)
Attempted to contact patient to get her scheduled for new ob appointment because she missed this appointment. No answer, left voicemail for the patient to give the office a call back to be rescheduled.

## 2020-10-22 IMAGING — CR DG CHEST 2V
2 series · 2 of 2 positions shown · non-contrast
Comparison: 07/01/2017

CLINICAL DATA: Shortness of breath, 24 weeks pregnant, tachypnea

EXAM:
CHEST - 2 VIEW

[chest pa]
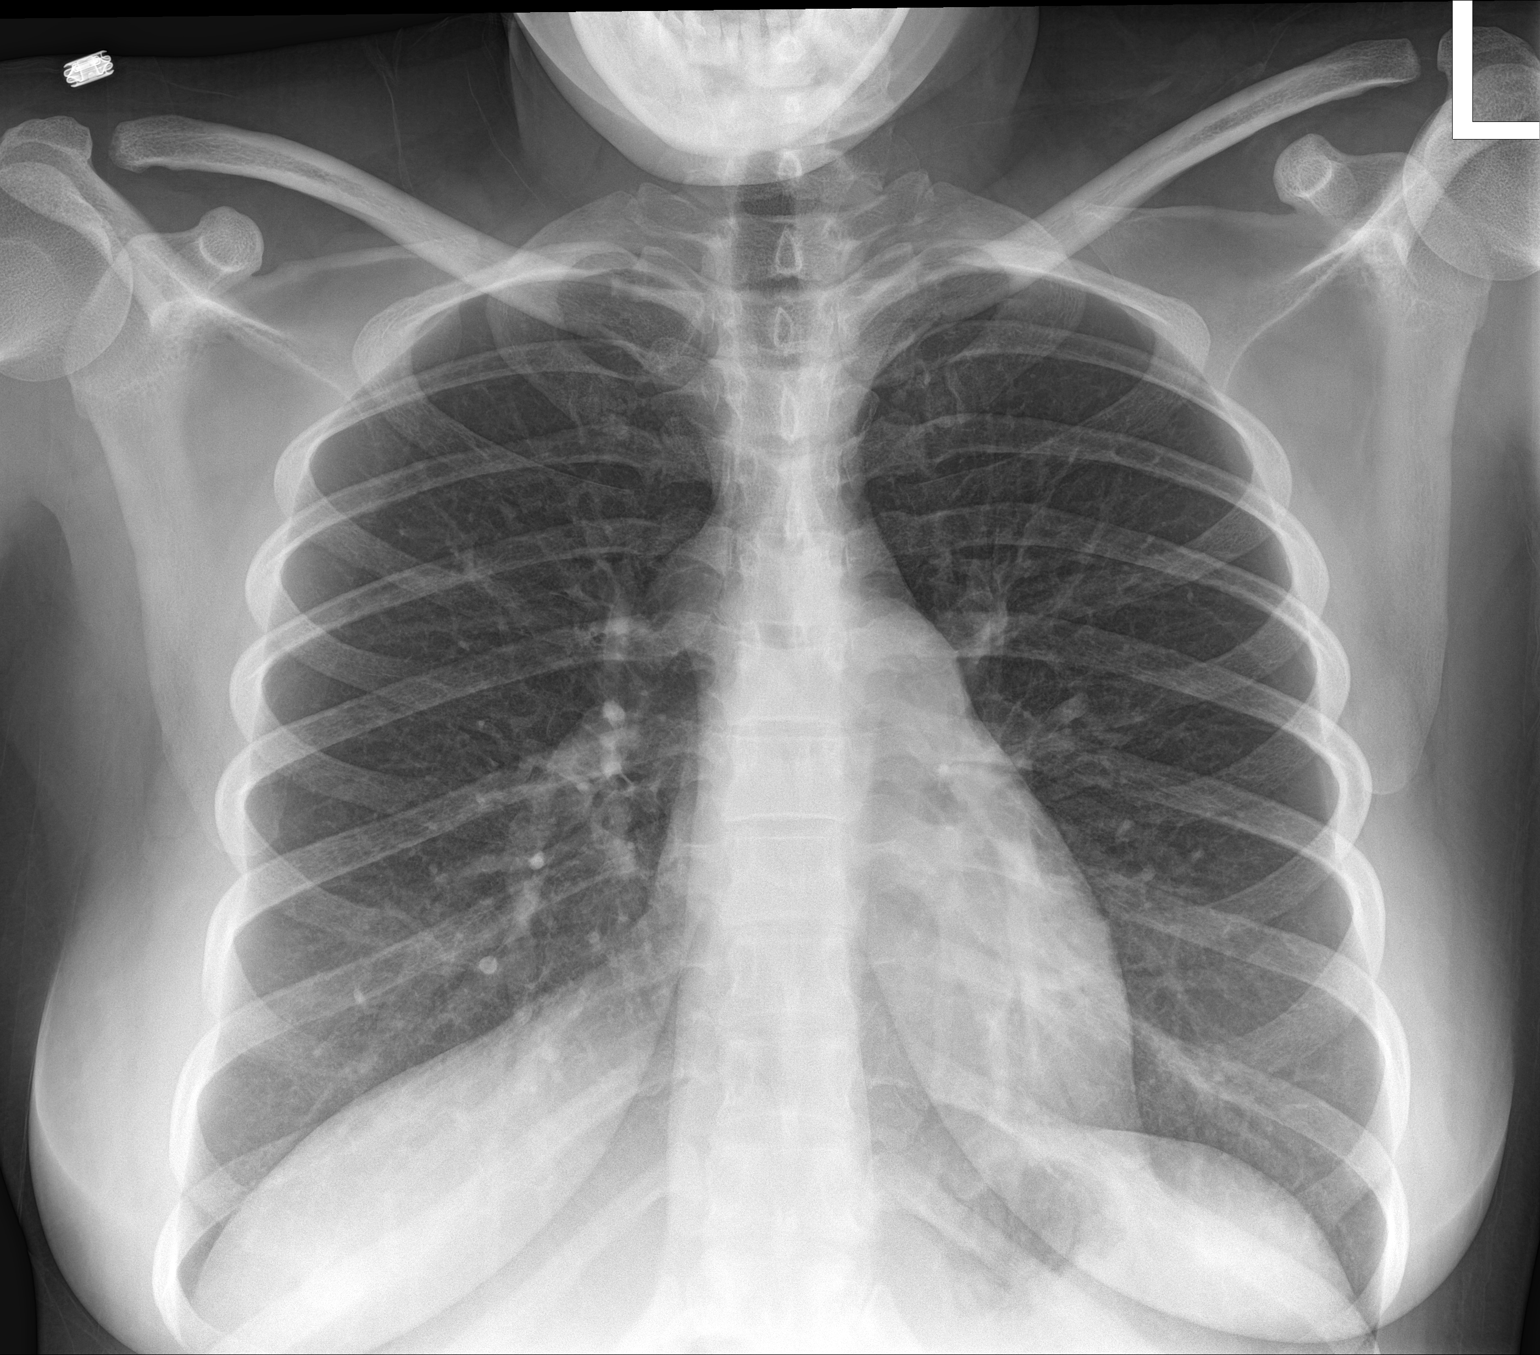

[chest lat]
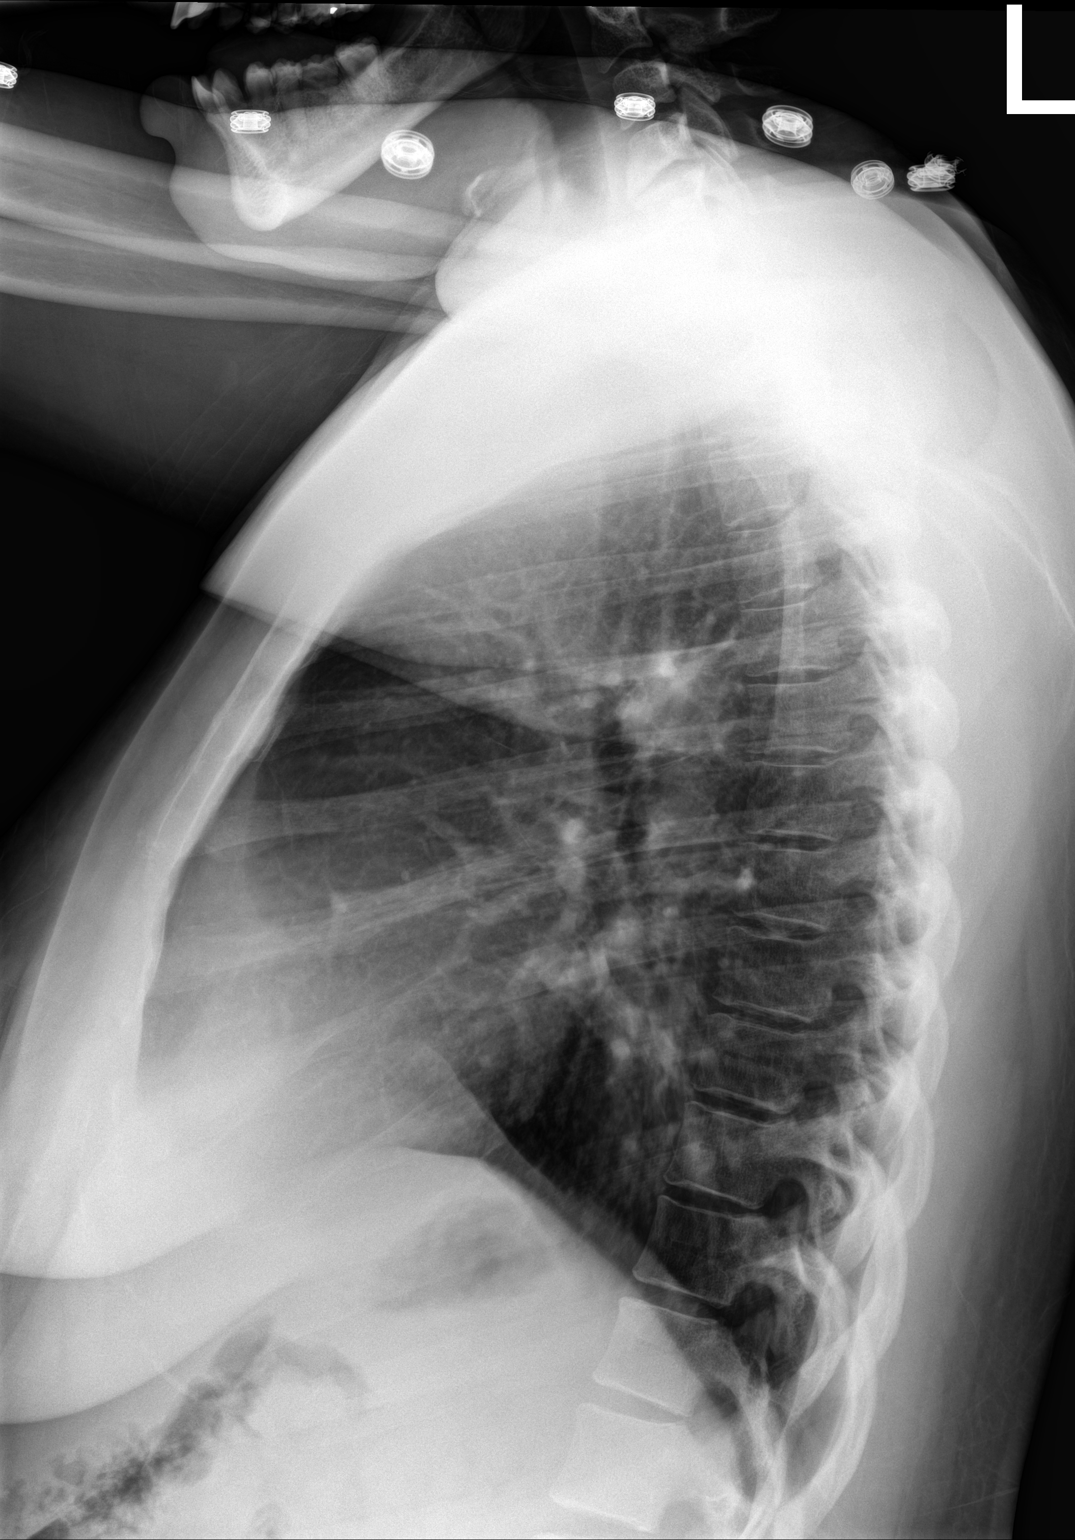

[2 of 2 positions shown; findings below may reference images not displayed]

FINDINGS: Lungs are clear.  No pleural effusion or pneumothorax.

The heart is normal in size.

Visualized osseous structures are within normal limits.
IMPRESSION: Normal chest radiographs.

## 2023-12-31 ENCOUNTER — Emergency Department (HOSPITAL_COMMUNITY): Payer: MEDICAID

## 2023-12-31 ENCOUNTER — Emergency Department (HOSPITAL_COMMUNITY)
Admission: EM | Admit: 2023-12-31 | Discharge: 2023-12-31 | Disposition: A | Discharge status: Home / Self Care | Payer: MEDICAID | Attending: Emergency Medicine | Admitting: Emergency Medicine

## 2023-12-31 ENCOUNTER — Other Ambulatory Visit: Payer: Self-pay

## 2023-12-31 ENCOUNTER — Encounter (HOSPITAL_COMMUNITY): Payer: Self-pay | Admitting: Emergency Medicine

## 2023-12-31 DIAGNOSIS — R112 Nausea with vomiting, unspecified: Secondary | ICD-10-CM | POA: Diagnosis not present

## 2023-12-31 DIAGNOSIS — R109 Unspecified abdominal pain: Secondary | ICD-10-CM | POA: Diagnosis present

## 2023-12-31 DIAGNOSIS — M545 Low back pain, unspecified: Secondary | ICD-10-CM | POA: Insufficient documentation

## 2023-12-31 DIAGNOSIS — Z87891 Personal history of nicotine dependence: Secondary | ICD-10-CM | POA: Diagnosis not present

## 2023-12-31 DIAGNOSIS — R202 Paresthesia of skin: Secondary | ICD-10-CM | POA: Diagnosis not present

## 2023-12-31 LAB — LIPASE, BLOOD: Lipase: 25 U/L (ref 11–51)

## 2023-12-31 LAB — URINALYSIS, ROUTINE W REFLEX MICROSCOPIC
Bilirubin Urine: NEGATIVE
Glucose, UA: NEGATIVE mg/dL
Hgb urine dipstick: NEGATIVE
Ketones, ur: NEGATIVE mg/dL
Leukocytes,Ua: NEGATIVE
Nitrite: NEGATIVE
Protein, ur: NEGATIVE mg/dL
Specific Gravity, Urine: 1.026 (ref 1.005–1.030)
pH: 5 (ref 5.0–8.0)

## 2023-12-31 LAB — CBC WITH DIFFERENTIAL/PLATELET
Abs Immature Granulocytes: 0.01 10*3/uL (ref 0.00–0.07)
Basophils Absolute: 0 10*3/uL (ref 0.0–0.1)
Basophils Relative: 1 %
Eosinophils Absolute: 0.1 10*3/uL (ref 0.0–0.5)
Eosinophils Relative: 1 %
HCT: 41.3 % (ref 36.0–46.0)
Hemoglobin: 13.8 g/dL (ref 12.0–15.0)
Immature Granulocytes: 0 %
Lymphocytes Relative: 61 %
Lymphs Abs: 3.2 10*3/uL (ref 0.7–4.0)
MCH: 31.2 pg (ref 26.0–34.0)
MCHC: 33.4 g/dL (ref 30.0–36.0)
MCV: 93.4 fL (ref 80.0–100.0)
Monocytes Absolute: 0.5 10*3/uL (ref 0.1–1.0)
Monocytes Relative: 9 %
Neutro Abs: 1.5 10*3/uL — ABNORMAL LOW (ref 1.7–7.7)
Neutrophils Relative %: 28 %
Platelets: 223 10*3/uL (ref 150–400)
RBC: 4.42 MIL/uL (ref 3.87–5.11)
RDW: 13.3 % (ref 11.5–15.5)
WBC: 5.3 10*3/uL (ref 4.0–10.5)
nRBC: 0 % (ref 0.0–0.2)

## 2023-12-31 LAB — COMPREHENSIVE METABOLIC PANEL WITH GFR
ALT: 14 U/L (ref 0–44)
AST: 18 U/L (ref 15–41)
Albumin: 4 g/dL (ref 3.5–5.0)
Alkaline Phosphatase: 45 U/L (ref 38–126)
Anion gap: 10 (ref 5–15)
BUN: 14 mg/dL (ref 6–20)
CO2: 22 mmol/L (ref 22–32)
Calcium: 8.9 mg/dL (ref 8.9–10.3)
Chloride: 107 mmol/L (ref 98–111)
Creatinine, Ser: 0.77 mg/dL (ref 0.44–1.00)
GFR, Estimated: >60 mL/min (ref >60)
Glucose, Bld: 101 mg/dL — ABNORMAL HIGH (ref 70–99)
Potassium: 4.1 mmol/L (ref 3.5–5.1)
Sodium: 139 mmol/L (ref 135–145)
Total Bilirubin: 0.6 mg/dL (ref 0.0–1.2)
Total Protein: 7.8 g/dL (ref 6.5–8.1)

## 2023-12-31 LAB — HCG, SERUM, QUALITATIVE: Preg, Serum: NEGATIVE

## 2023-12-31 NOTE — ED Provider Notes (Signed)
 [place] EMERGENCY DEPARTMENT AT [facility] Provider Note   CSN: [id number] Arrival date & time: 12/31/23  2010     History  Chief Complaint  Patient presents with   Flank Pain    Lauren Moss is a 33 y.o. female.  Patient with past medical history significant for bipolar 1 disorder, heart murmur, hepatitis B antibody positive presents to the emergency department with 3 complaints.  Patient complains of bilateral flank pain which is been ongoing for 1 week.  She denies any associated urinary symptoms or vaginal discharge.  Patient also complains of subjective fever with nausea and emesis yesterday which has resolved.  Patient also states that her right little finger is numb to the touch and she is a difficult time abducting or adducting it.  This is been ongoing for 2 or more months.  She denies any known injury to the area.  She denies any other neurologic deficits.  Patient denies chest pain, shortness of breath, abdominal pain, current nausea, vomiting, diarrhea, urinary symptoms, vaginal discharge.   Flank Pain       Home Medications Prior to Admission medications   Medication Sig Start Date End Date Taking? Authorizing Provider  Blood Pressure Monitoring (BLOOD PRESSURE KIT) DEVI 1 Device by Does not apply route as needed. 01/11/20   Ervin, Michael L, MD  Doxylamine -Pyridoxine  (DICLEGIS ) 10-10 MG TBEC Take 2 tablets by mouth at bedtime. 01/04/20   Worley Headings, MD  famotidine  (PEPCID ) 20 MG tablet Take 1 tablet (20 mg total) by mouth 2 (two) times daily. 11/20/19   Agatha Alcon, CNM  ondansetron  (ZOFRAN  ODT) 4 MG disintegrating tablet Take 1 tablet (4 mg total) by mouth every 8 (eight) hours as needed for nausea or vomiting. 12/28/19   Worley Headings, MD  Pediatric Multiple Vitamins (FLINTSTONES MULTIVITAMIN PO) Take 2 tablets by mouth daily.    [provider]  promethazine  (PHENERGAN ) 25 MG tablet Take 1 tablet (25 mg total) by mouth every 6  (six) hours as needed for nausea or vomiting. 11/29/19   Sparacino, Hailey L, DO      Allergies    Bee venom and Other    Review of Systems   Review of Systems  Genitourinary:  Positive for flank pain.    Physical Exam Updated Vital Signs BP (!) 146/92   Pulse 87   Temp 97.8 F (36.6 C)   Resp 16   Ht 5\' 3"  (1.6 m)   Wt 81.6 kg   LMP 12/16/2023 (Approximate)   SpO2 99%   BMI 31.89 kg/m  Physical Exam Vitals and nursing note reviewed.  Constitutional:      General: She is not in acute distress.    Appearance: She is well-developed.  HENT:     Head: Normocephalic and atraumatic.  Eyes:     Conjunctiva/sclera: Conjunctivae normal.  Cardiovascular:     Rate and Rhythm: Normal rate and regular rhythm.  Pulmonary:     Effort: Pulmonary effort is normal. No respiratory distress.     Breath sounds: Normal breath sounds.  Abdominal:     Palpations: Abdomen is soft.     Tenderness: There is no abdominal tenderness.  Musculoskeletal:        General: Tenderness (Mild bilateral lower back tenderness with no midline tenderness) present. No swelling.     Cervical back: Neck supple.     Comments: Subjective numbness to the small finger of the right hand.  Patient endorses decreased sensation to  the ring finger of the right hand.  Patient is unable to abduct or adduct small finger of right hand.  She has difficulty with flexion and extension.  Skin:    General: Skin is warm and dry.     Capillary Refill: Capillary refill takes less than 2 seconds.  Neurological:     Mental Status: She is alert.  Psychiatric:        Mood and Affect: Mood normal.     ED Results / Procedures / Treatments   Labs (all labs ordered are listed, but only abnormal results are displayed) Labs Reviewed  CBC WITH DIFFERENTIAL/PLATELET - Abnormal; Notable for the following components:      Result Value   Neutro Abs 1.5 (*)    All other components within normal limits  COMPREHENSIVE METABOLIC PANEL  WITH GFR - Abnormal; Notable for the following components:   Glucose, Bld 101 (*)    All other components within normal limits  URINALYSIS, ROUTINE W REFLEX MICROSCOPIC - Abnormal; Notable for the following components:   APPearance HAZY (*)    All other components within normal limits  LIPASE, BLOOD  HCG, SERUM, QUALITATIVE    EKG None  Radiology DG Hand 2 View Right Result Date: 12/31/2023 CLINICAL DATA:  Right little finger numbness and decreased motor function. EXAM: RIGHT HAND - 2 VIEW COMPARISON:  None Available. FINDINGS: There is no evidence of fracture or dislocation. There is no evidence of arthropathy or other focal bone abnormality. Soft tissues are unremarkable. IMPRESSION: Negative. Electronically Signed   By: Rozell Cornet M.D.   On: 12/31/2023 23:21   CT Renal Stone Study Result Date: 12/31/2023 CLINICAL DATA:  Right flank pain. EXAM: CT ABDOMEN AND PELVIS WITHOUT CONTRAST TECHNIQUE: Multidetector CT imaging of the abdomen and pelvis was performed following the standard protocol without IV contrast. RADIATION DOSE REDUCTION: This exam was performed according to the departmental dose-optimization program which includes automated exposure control, adjustment of the mA and/or kV according to patient size and/or use of iterative reconstruction technique. COMPARISON:  March 28, 2019 FINDINGS: Lower chest: Very mild posterior right basilar atelectasis is noted. Hepatobiliary: No focal liver abnormality is seen. No gallstones, gallbladder wall thickening, or biliary dilatation. Pancreas: Unremarkable. No pancreatic ductal dilatation or surrounding inflammatory changes. Spleen: Normal in size without focal abnormality. Adrenals/Urinary Tract: Adrenal glands are unremarkable. Kidneys are normal, without renal calculi, focal lesion, or hydronephrosis. The urinary bladder is poorly distended and subsequently limited in evaluation. Mild diffuse urinary bladder wall thickening is noted.  Stomach/Bowel: Stomach is within normal limits. The appendix is not clearly identified. 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 No evidence of bowel wall thickening, distention, or inflammatory changes. A large noninflamed diverticulum is seen within and inferiorly positioned loop of transverse colon. Vascular/Lymphatic: No significant vascular findings are present. No enlarged abdominal or pelvic lymph nodes. Reproductive: Uterus and bilateral adnexa are unremarkable. Other: No abdominal wall hernia or abnormality. No abdominopelvic ascites. Musculoskeletal: No acute or significant osseous findings. IMPRESSION: 1. No evidence of renal calculi or hydronephrosis. 2. Mild diffuse urinary bladder wall thickening which may be secondary to poor distention. Correlation with urinalysis is recommended to exclude cystitis. Electronically Signed   By: Virgle Grime M.D.   On: 12/31/2023 22:15    Procedures Procedures    Medications Ordered in ED Medications - No data to display  ED Course/ Medical Decision Making/ A&P  Medical Decision Making Amount and/or Complexity of Data Reviewed Radiology: ordered.   This patient presents to the ED for concern of flank pain, this involves an extensive number of treatment options, and is a complaint that carries with it a high risk of complications and morbidity.  The differential diagnosis includes nephrolithiasis, hydronephrosis, pyelonephritis, cystitis, musculoskeletal pain, others.  Patient also complains of fever with nausea and vomiting which has resolved.  Patient also complains of difficulty with movement and sensation of her small finger of her right hand.   Co morbidities / Chronic conditions that complicate the patient evaluation  Bipolar 1 disorder   Additional history obtained:  Additional history obtained from EMR   Lab Tests:  I Ordered, and personally interpreted labs.  The pertinent results  include: Grossly unremarkable UA, CMP, CBC, lipase, negative pregnancy test   Imaging Studies ordered:  I ordered imaging studies including CT renal stone study, plain films of the right hand I independently visualized and interpreted imaging which showed  1. No evidence of renal calculi or hydronephrosis.  2. Mild diffuse urinary bladder wall thickening which may be  secondary to poor distention  Negative hand x-rays I agree with the radiologist interpretation   Social Determinants of Health:  Patient is a former smoker   Test / Admission - Considered:  No acute findings on CT scan to explain patient's findings.  She does have bilateral flank tenderness which I feel is musculoskeletal in nature.  No signs of hydronephrosis or nephrolithiasis on imaging.  CT does mention diffuse bladder wall thickening but patient's UA does not show signs of infection.  She does not complain of dysuria or increased urinary frequency. I suspect patient had a viral illness which caused her subjective fever with nausea and vomiting yesterday.  This resolved on its own before coming today Unclear cause of patient's hand weakness/numbness.  Will refer to hand specialist for further evaluation.  No other neurologic deficits.  No indication for advanced imaging in the emergent setting. Patient stable for discharge home at this time.  She is tolerating oral intake without difficulty.  No indication for admission.  Return precautions provided.         Final Clinical Impression(s) / ED Diagnoses Final diagnoses:  Acute bilateral low back pain without sciatica  Paresthesia of finger  Nausea and vomiting, unspecified vomiting type    Rx / DC Orders ED Discharge Orders     None         Delories Fetter 12/31/23 2338    Lindle Rhea, MD 01/01/24 [id number]

## 2023-12-31 NOTE — ED Provider Triage Note (Signed)
 Emergency Medicine Provider Triage Evaluation Note  BEATRIC FULOP , a 33 y.o. female  was evaluated in triage.  Pt complains of right flank pain for the past few days with episode emesis yesterday and subjective fevers and chills.  Patient [place] any hematuria, vaginal bleeding, vaginal discharge chest pain shortness of breath abdominal pain patient states that pain is in the right flank.  Review of Systems  Positive:  Negative:   Physical Exam  BP (!) 146/92   Pulse 87   Temp 97.8 F (36.6 C)   Resp 16   Ht 5\' 3"  (1.6 m)   Wt 81.6 kg   LMP 12/16/2023 (Approximate)   SpO2 99%   BMI 31.89 kg/m  Gen:   Awake, no distress   Resp:  Normal effort  MSK:   Moves extremities without difficulty  Other:  No CVA tenderness or abdominal tenderness or peritoneal signs  Medical Decision Making  Medically screening exam initiated at 8:25 PM.  Appropriate orders placed.  Lore Rode was informed that the remainder of the evaluation will be completed by another provider, this initial triage assessment does not replace that evaluation, and the importance of remaining in the ED until their evaluation is complete.  Workup initiated, patient stable   Denese Finn, Kirby Peoples 12/31/23 2025

## 2023-12-31 NOTE — Discharge Instructions (Signed)
 Your workup tonight was reassuring.  Your images showed no acute cause for your low back pain.  I feel this is likely musculoskeletal in nature.  I recommend taking ibuprofen  at home for pain control.  There were no signs of infection or other acute abnormalities on your lab work.  The cause of your right finger numbness is unclear.  I recommend following up with a hand specialist for further evaluation.  I have provided contact information. If you develop any life threatening symptoms please return to the emergency department.

## 2023-12-31 NOTE — ED Triage Notes (Signed)
 Patient c/o flank pain x 1 week. Patient report worsening sharp pain bilateral flank pain tonight. Patient denies dysuria. Patient report nausea denies vomiting. Patient denies fever.

## 2024-04-15 ENCOUNTER — Other Ambulatory Visit: Payer: Self-pay

## 2024-04-15 ENCOUNTER — Encounter (HOSPITAL_COMMUNITY): Payer: Self-pay | Admitting: *Deleted

## 2024-04-15 ENCOUNTER — Emergency Department (HOSPITAL_COMMUNITY)
Admission: EM | Admit: 2024-04-15 | Discharge: 2024-04-15 | Disposition: A | Discharge status: Home / Self Care | Payer: MEDICAID | Attending: Emergency Medicine | Admitting: Emergency Medicine

## 2024-04-15 DIAGNOSIS — K029 Dental caries, unspecified: Secondary | ICD-10-CM | POA: Insufficient documentation

## 2024-04-15 DIAGNOSIS — K0889 Other specified disorders of teeth and supporting structures: Secondary | ICD-10-CM

## 2024-04-15 MED ORDER — OXYCODONE-ACETAMINOPHEN 5-325 MG PO TABS
ORAL_TABLET | ORAL | Status: AC
  Filled 2024-04-15: qty 1

## 2024-04-15 MED ORDER — AMOXICILLIN-POT CLAVULANATE 400-57 MG/5ML PO SUSR: 875.0000 mg | Freq: Two times a day (BID) | ORAL | 0 refills | Status: AC

## 2024-04-15 MED ORDER — OXYCODONE-ACETAMINOPHEN 5-325 MG PO TABS: 1.0000 | ORAL_TABLET | Freq: Four times a day (QID) | ORAL | 0 refills | Status: AC | PRN

## 2024-04-15 MED ORDER — OXYCODONE-ACETAMINOPHEN 5-325 MG PO TABS
1.0000 | ORAL_TABLET | Freq: Once | ORAL | Status: AC
  Administered 2024-04-15: 1 via ORAL

## 2024-04-15 MED ORDER — OXYCODONE-ACETAMINOPHEN 5-325 MG PO TABS: 2.0000 | ORAL_TABLET | Freq: Once | ORAL | Status: DC

## 2024-04-15 NOTE — ED Triage Notes (Signed)
 Pt started taking her amoxicillin  yesterday. Tonight started have tongue swelling and mouth pain. Vomited pta

## 2024-04-15 NOTE — Discharge Instructions (Signed)
Please take entire course of antibiotics as directed.  Continue using ibuprofen / Tylenol, as well as prescribed Orajel for pain.  You will need to follow-up with your dentist for continued management of this. Please see dental resources below. Return to the emergency department for fevers, swelling or pain under the tongue or in the neck, difficulty breathing or swallowing, nausea or vomiting that does not stop, or any other new or concerning symptoms.

## 2024-04-15 NOTE — ED Provider Notes (Signed)
 [place] EMERGENCY DEPARTMENT AT [facility] Provider Note   CSN: [id number] Arrival date & time: 04/15/24  9642     Patient presents with: Dental Pain and Allergic Reaction   Lauren Moss is a 33 y.o. female with known dental infections initiated on amoxicillin  875 3 times daily by her dentist prior to extractions who presents with persistent worsening of her pain.  Patient has been on amoxicillin  for less than 48 hours, she states she feels like her tongue swelling is painful to swallow, she is having difficulty taking her antibiotics.  Patient is tearful, history provided primarily by her partner at the bedside.  Patient does have some drooling but can tolerate her secretions when she chooses to.  History of multiple pregnancies, depression, bipolar.SABRA   HPI     Prior to Admission medications   Medication Sig Start Date End Date Taking? Authorizing Provider  amoxicillin -clavulanate (AUGMENTIN ) 400-57 MG/5ML suspension Take 10.9 mLs (875 mg total) by mouth 2 (two) times daily for 7 days. 04/15/24 04/22/24 Yes Izzac Rockett R, PA-C  oxyCODONE -acetaminophen  (PERCOCET/ROXICET) 5-325 MG tablet Take 1 tablet by mouth every 6 (six) hours as needed for severe pain (pain score 7-10). 04/15/24  Yes Cloie Wooden R, PA-C  Blood Pressure Monitoring (BLOOD PRESSURE KIT) DEVI 1 Device by Does not apply route as needed. 01/11/20   Ervin, Michael L, MD  Doxylamine -Pyridoxine  (DICLEGIS ) 10-10 MG TBEC Take 2 tablets by mouth at bedtime. 01/04/20   Trudy Rozelle ONEIDA, MD  famotidine  (PEPCID ) 20 MG tablet Take 1 tablet (20 mg total) by mouth 2 (two) times daily. 11/20/19   Marylen Aleck HERO, CNM  ondansetron  (ZOFRAN  ODT) 4 MG disintegrating tablet Take 1 tablet (4 mg total) by mouth every 8 (eight) hours as needed for nausea or vomiting. 12/28/19   Trudy Rozelle ONEIDA, MD  Pediatric Multiple Vitamins (FLINTSTONES MULTIVITAMIN PO) Take 2 tablets by mouth daily.    [provider]   promethazine  (PHENERGAN ) 25 MG tablet Take 1 tablet (25 mg total) by mouth every 6 (six) hours as needed for nausea or vomiting. 11/29/19   Sparacino, Hailey L, DO    Allergies: Bee venom and Other    Review of Systems  HENT:  Positive for dental problem, drooling and sore throat.     Updated Vital Signs BP (!) 139/94 (BP Location: Right Arm)   Pulse 89   Temp 98.2 F (36.8 C)   Resp 20   SpO2 99%   Physical Exam Vitals and nursing note reviewed.  Constitutional:      Appearance: She is obese. She is not ill-appearing or toxic-appearing.  HENT:     Head: Normocephalic and atraumatic.     Mouth/Throat:     Mouth: Mucous membranes are moist.     Dentition: Abnormal dentition. Dental tenderness, gingival swelling and dental caries present. No dental abscesses.     Pharynx: Uvula midline. No oropharyngeal exudate or posterior oropharyngeal erythema.     Tonsils: No tonsillar exudate or tonsillar abscesses.     Comments: No sublingual or submental tenderness to palpation.  Uvula is midline, no evidence posterior pharyngeal edema or lesions.  There is extensive dental carry in the molars with maxillary and mandibular with adjacent gingival edema and erythema. Eyes:     General:        Right eye: No discharge.        Left eye: No discharge.     Extraocular Movements: Extraocular movements intact.  Conjunctiva/sclera: Conjunctivae normal.     Pupils: Pupils are equal, round, and reactive to light.  Neck:     Trachea: Trachea and phonation normal.     Meningeal: Brudzinski's sign and Kernig's sign absent.  Cardiovascular:     Rate and Rhythm: Normal rate and regular rhythm.     Pulses: Normal pulses.     Heart sounds: Normal heart sounds. No murmur heard. Pulmonary:     Effort: Pulmonary effort is normal. No respiratory distress.     Breath sounds: Normal breath sounds. No wheezing or rales.  Abdominal:     General: Bowel sounds are normal. There is no distension.      Palpations: Abdomen is soft.     Tenderness: There is no abdominal tenderness. There is no guarding or rebound.  Musculoskeletal:        General: No deformity.     Cervical back: Normal range of motion and neck supple. No edema, rigidity or crepitus. No pain with movement, spinous process tenderness or muscular tenderness.     Right lower leg: No edema.     Left lower leg: No edema.  Lymphadenopathy:     Cervical: No cervical adenopathy.  Skin:    General: Skin is warm and dry.     Capillary Refill: Capillary refill takes less than 2 seconds.  Neurological:     General: No focal deficit present.     Mental Status: She is alert and oriented to person, place, and time. Mental status is at baseline.  Psychiatric:        Mood and Affect: Mood normal.     (all labs ordered are listed, but only abnormal results are displayed) Labs Reviewed - No data to display  EKG: None  Radiology: No results found.   Procedures   Medications Ordered in the ED  oxyCODONE -acetaminophen  (PERCOCET/ROXICET) 5-325 MG per tablet 1 tablet (1 tablet Oral Given 04/15/24 0547)                                    Medical Decision Making 33 year old female who presents with concern for dental pain.  Hypertensive on intake, vitals otherwise normal.  Cardiopulmonary dam unremarkable, abdominal exam is benign.  Oropharyngeal exam reassuring without evidence of Ludwig's angina.  No evidence of oropharyngeal abscess.  She is  Risk Prescription drug management.   The patient was consistent with dental infection related pain patient drooling due to soreness of teeth when she swallows, however no evidence of deep space infection on physical exam.  Will prescribe antibiotics in liquid form per patient request, will discharge with pain medication and close outpatient follow-up with her dentist as previously discussed.  Clinical concern for emergent underlying condition that would warrant further ED workup and  patient management is exceedingly low.  Shanya voiced understanding of her medical evaluation and treatment plan. Each of their questions answered to their expressed satisfaction.  Return precautions were given.  Patient is well-appearing, stable, and was discharged in good condition.  This chart was dictated using voice recognition software, Dragon. Despite the best efforts of this provider to proofread and correct errors, errors may still occur which can change documentation meaning.      Final diagnoses:  Pain, dental    ED Discharge Orders          Ordered    amoxicillin -clavulanate (AUGMENTIN ) 400-57 MG/5ML suspension  2 times daily  04/15/24 0532    oxyCODONE -acetaminophen  (PERCOCET/ROXICET) 5-325 MG tablet  Every 6 hours PRN        04/15/24 0532               Zadrian Mccauley, Pleasant SAUNDERS, PA-C 04/15/24 0709    Mesner, Selinda, MD 04/15/24 [phone]

## 2024-04-15 NOTE — ED Triage Notes (Signed)
 Pt states she had a dental infection was seen & given amoxicillin  2 days ago. Now her tongue is swelling & she is vomiting. Airway patent. No signs of distress at this time.
